# Patient Record
Sex: Female | Born: 1940
Health system: Southern US, Community
[De-identification: ages and names within clinical notes are randomized; demographics above are authoritative.]

## PROBLEM LIST (undated history)

## (undated) DIAGNOSIS — I2510 Atherosclerotic heart disease of native coronary artery without angina pectoris: Secondary | ICD-10-CM

## (undated) DIAGNOSIS — R251 Tremor, unspecified: Secondary | ICD-10-CM

## (undated) DIAGNOSIS — E785 Hyperlipidemia, unspecified: Secondary | ICD-10-CM

## (undated) DIAGNOSIS — C801 Malignant (primary) neoplasm, unspecified: Secondary | ICD-10-CM

## (undated) DIAGNOSIS — I10 Essential (primary) hypertension: Secondary | ICD-10-CM

## (undated) DIAGNOSIS — K449 Diaphragmatic hernia without obstruction or gangrene: Secondary | ICD-10-CM

## (undated) DIAGNOSIS — K219 Gastro-esophageal reflux disease without esophagitis: Secondary | ICD-10-CM

## (undated) DIAGNOSIS — D649 Anemia, unspecified: Secondary | ICD-10-CM

## (undated) DIAGNOSIS — I739 Peripheral vascular disease, unspecified: Secondary | ICD-10-CM

## (undated) DIAGNOSIS — E1140 Type 2 diabetes mellitus with diabetic neuropathy, unspecified: Secondary | ICD-10-CM

## (undated) DIAGNOSIS — M1990 Unspecified osteoarthritis, unspecified site: Secondary | ICD-10-CM

## (undated) DIAGNOSIS — I509 Heart failure, unspecified: Secondary | ICD-10-CM

## (undated) HISTORY — DX: Diaphragmatic hernia without obstruction or gangrene: K44.9

## (undated) HISTORY — DX: Anemia, unspecified: D64.9

## (undated) HISTORY — DX: Atherosclerotic heart disease of native coronary artery without angina pectoris: I25.10

## (undated) HISTORY — DX: Hyperlipidemia, unspecified: E78.5

## (undated) HISTORY — DX: Tremor, unspecified: R25.1

## (undated) HISTORY — PX: CHOLECYSTECTOMY: SHX55

## (undated) HISTORY — DX: Type 2 diabetes mellitus with diabetic neuropathy, unspecified: E11.40

## (undated) HISTORY — DX: Essential (primary) hypertension: I10

## (undated) HISTORY — PX: CARDIAC CATHETERIZATION: SHX172

## (undated) HISTORY — DX: Heart failure, unspecified: I50.9

## (undated) HISTORY — DX: Unspecified osteoarthritis, unspecified site: M19.90

## (undated) HISTORY — DX: Malignant (primary) neoplasm, unspecified: C80.1

## (undated) HISTORY — PX: EYE SURGERY: SHX253

## (undated) HISTORY — DX: Peripheral vascular disease, unspecified: I73.9

## (undated) HISTORY — PX: CORONARY ANGIOPLASTY: SHX604

---

## 1972-02-20 HISTORY — PX: KNEE SURGERY: SHX244

## 1997-06-23 ENCOUNTER — Encounter: Admission: RE | Admit: 1997-06-23 | Discharge: 1997-06-23 | Payer: Self-pay | Admitting: Hematology and Oncology

## 1997-06-30 ENCOUNTER — Ambulatory Visit (HOSPITAL_COMMUNITY): Admission: RE | Admit: 1997-06-30 | Discharge: 1997-06-30 | Payer: Self-pay

## 1997-07-14 ENCOUNTER — Encounter: Admission: RE | Admit: 1997-07-14 | Discharge: 1997-07-14 | Payer: Self-pay | Admitting: Hematology and Oncology

## 1997-08-18 ENCOUNTER — Ambulatory Visit: Admission: RE | Admit: 1997-08-18 | Discharge: 1997-08-18 | Payer: Self-pay

## 1997-09-14 ENCOUNTER — Encounter: Admission: RE | Admit: 1997-09-14 | Discharge: 1997-09-14 | Payer: Self-pay | Admitting: Hematology and Oncology

## 1998-05-05 ENCOUNTER — Encounter: Admission: RE | Admit: 1998-05-05 | Discharge: 1998-05-05 | Payer: Self-pay | Admitting: Internal Medicine

## 1998-10-17 ENCOUNTER — Encounter: Admission: RE | Admit: 1998-10-17 | Discharge: 1998-10-17 | Payer: Self-pay | Admitting: Internal Medicine

## 1999-04-06 ENCOUNTER — Encounter: Admission: RE | Admit: 1999-04-06 | Discharge: 1999-04-06 | Payer: Self-pay | Admitting: Internal Medicine

## 1999-04-11 ENCOUNTER — Encounter: Admission: RE | Admit: 1999-04-11 | Discharge: 1999-04-11 | Payer: Self-pay | Admitting: Internal Medicine

## 1999-04-25 ENCOUNTER — Encounter: Admission: RE | Admit: 1999-04-25 | Discharge: 1999-04-25 | Payer: Self-pay | Admitting: Hematology and Oncology

## 1999-05-02 ENCOUNTER — Encounter: Admission: RE | Admit: 1999-05-02 | Discharge: 1999-05-02 | Payer: Self-pay | Admitting: Internal Medicine

## 1999-06-22 ENCOUNTER — Encounter: Admission: RE | Admit: 1999-06-22 | Discharge: 1999-06-22 | Payer: Self-pay | Admitting: Hematology and Oncology

## 1999-07-06 ENCOUNTER — Encounter: Admission: RE | Admit: 1999-07-06 | Discharge: 1999-07-06 | Payer: Self-pay | Admitting: Hematology and Oncology

## 1999-09-22 ENCOUNTER — Encounter: Admission: RE | Admit: 1999-09-22 | Discharge: 1999-09-22 | Payer: Self-pay | Admitting: Internal Medicine

## 1999-10-16 ENCOUNTER — Encounter: Admission: RE | Admit: 1999-10-16 | Discharge: 1999-10-16 | Payer: Self-pay | Admitting: Internal Medicine

## 1999-11-23 ENCOUNTER — Encounter: Payer: Self-pay | Admitting: *Deleted

## 1999-11-23 ENCOUNTER — Encounter: Admission: RE | Admit: 1999-11-23 | Discharge: 1999-11-23 | Payer: Self-pay | Admitting: *Deleted

## 2000-01-26 ENCOUNTER — Encounter: Admission: RE | Admit: 2000-01-26 | Discharge: 2000-01-26 | Payer: Self-pay | Admitting: Internal Medicine

## 2000-06-04 ENCOUNTER — Encounter: Admission: RE | Admit: 2000-06-04 | Discharge: 2000-06-04 | Payer: Self-pay | Admitting: Hematology and Oncology

## 2000-12-12 ENCOUNTER — Encounter: Admission: RE | Admit: 2000-12-12 | Discharge: 2000-12-12 | Payer: Self-pay

## 2001-09-17 ENCOUNTER — Encounter: Admission: RE | Admit: 2001-09-17 | Discharge: 2001-09-17 | Payer: Self-pay | Admitting: Internal Medicine

## 2001-11-21 ENCOUNTER — Encounter: Admission: RE | Admit: 2001-11-21 | Discharge: 2001-11-21 | Payer: Self-pay | Admitting: Internal Medicine

## 2002-03-24 ENCOUNTER — Encounter: Admission: RE | Admit: 2002-03-24 | Discharge: 2002-03-24 | Payer: Self-pay | Admitting: Internal Medicine

## 2002-04-01 ENCOUNTER — Encounter: Payer: Self-pay | Admitting: Internal Medicine

## 2002-04-01 ENCOUNTER — Encounter: Admission: RE | Admit: 2002-04-01 | Discharge: 2002-04-01 | Payer: Self-pay | Admitting: Internal Medicine

## 2002-04-13 ENCOUNTER — Encounter: Admission: RE | Admit: 2002-04-13 | Discharge: 2002-04-13 | Payer: Self-pay | Admitting: Internal Medicine

## 2002-05-16 ENCOUNTER — Encounter: Payer: Self-pay | Admitting: Cardiovascular Disease

## 2002-05-16 ENCOUNTER — Inpatient Hospital Stay (HOSPITAL_COMMUNITY): Admission: EM | Admit: 2002-05-16 | Discharge: 2002-05-26 | Payer: Self-pay | Admitting: Emergency Medicine

## 2002-05-17 ENCOUNTER — Encounter: Payer: Self-pay | Admitting: Cardiovascular Disease

## 2002-05-18 ENCOUNTER — Encounter: Payer: Self-pay | Admitting: Cardiology

## 2002-05-19 ENCOUNTER — Encounter: Payer: Self-pay | Admitting: Cardiovascular Disease

## 2002-05-20 ENCOUNTER — Encounter: Payer: Self-pay | Admitting: Cardiovascular Disease

## 2002-05-22 ENCOUNTER — Encounter (INDEPENDENT_AMBULATORY_CARE_PROVIDER_SITE_OTHER): Payer: Self-pay | Admitting: Cardiology

## 2002-05-22 ENCOUNTER — Encounter: Payer: Self-pay | Admitting: Cardiovascular Disease

## 2002-05-24 ENCOUNTER — Encounter: Payer: Self-pay | Admitting: Cardiovascular Disease

## 2002-06-09 ENCOUNTER — Encounter: Admission: RE | Admit: 2002-06-09 | Discharge: 2002-06-09 | Payer: Self-pay | Admitting: Internal Medicine

## 2002-07-27 ENCOUNTER — Encounter: Admission: RE | Admit: 2002-07-27 | Discharge: 2002-07-27 | Payer: Self-pay | Admitting: Internal Medicine

## 2002-07-29 ENCOUNTER — Inpatient Hospital Stay (HOSPITAL_COMMUNITY): Admission: EM | Admit: 2002-07-29 | Discharge: 2002-07-31 | Payer: Self-pay

## 2002-07-30 ENCOUNTER — Encounter: Payer: Self-pay | Admitting: Cardiovascular Disease

## 2005-06-05 ENCOUNTER — Ambulatory Visit (HOSPITAL_COMMUNITY): Admission: RE | Admit: 2005-06-05 | Discharge: 2005-06-05 | Payer: Self-pay | Admitting: *Deleted

## 2005-07-03 ENCOUNTER — Ambulatory Visit (HOSPITAL_COMMUNITY): Admission: RE | Admit: 2005-07-03 | Discharge: 2005-07-03 | Payer: Self-pay | Admitting: *Deleted

## 2005-07-03 ENCOUNTER — Encounter (INDEPENDENT_AMBULATORY_CARE_PROVIDER_SITE_OTHER): Payer: Self-pay | Admitting: Specialist

## 2005-11-19 ENCOUNTER — Encounter: Admission: RE | Admit: 2005-11-19 | Discharge: 2005-11-19 | Payer: Self-pay | Admitting: Internal Medicine

## 2007-05-05 ENCOUNTER — Encounter: Admission: RE | Admit: 2007-05-05 | Discharge: 2007-05-05 | Payer: Self-pay | Admitting: Internal Medicine

## 2008-02-20 HISTORY — PX: OTHER SURGICAL HISTORY: SHX169

## 2009-06-07 ENCOUNTER — Encounter: Admission: RE | Admit: 2009-06-07 | Discharge: 2009-06-07 | Payer: Self-pay | Admitting: Internal Medicine

## 2009-06-07 ENCOUNTER — Encounter: Payer: Self-pay | Admitting: Cardiovascular Disease

## 2009-10-25 ENCOUNTER — Encounter: Payer: Self-pay | Admitting: Cardiovascular Disease

## 2009-11-08 ENCOUNTER — Ambulatory Visit: Payer: Self-pay | Admitting: Cardiovascular Disease

## 2009-11-08 DIAGNOSIS — E785 Hyperlipidemia, unspecified: Secondary | ICD-10-CM

## 2009-11-08 DIAGNOSIS — E119 Type 2 diabetes mellitus without complications: Secondary | ICD-10-CM

## 2009-11-08 DIAGNOSIS — I2589 Other forms of chronic ischemic heart disease: Secondary | ICD-10-CM

## 2009-11-08 DIAGNOSIS — I2510 Atherosclerotic heart disease of native coronary artery without angina pectoris: Secondary | ICD-10-CM | POA: Insufficient documentation

## 2010-03-21 NOTE — Letter (Signed)
Summary: Lower Arterial Doppler  Lower Arterial Doppler   Imported By: Marylou Mccoy 11/24/2009 13:51:18  _____________________________________________________________________  External Attachment:    Type:   Image     Comment:   External Document

## 2010-03-21 NOTE — Assessment & Plan Note (Signed)
Summary: NP6/AMD   Visit Type:  Initial Consult Primary Provider:  Gerlene Burdock Pang,M.D.  CC:  Establish care with a cardiologist prior stent and MI 2004; was seeing Dr. Charolette Child.  Denies chest pain or shortness of breath..  History of Present Illness: Teresa Peck is a 70 year old woman with a history of coronary artery disease, occlusion of her LAD in 2004, 80% lesion in her diagonal with stent placed to her diagonal, who presents to establish care.  The LAD was opened in March of 2004 to be canalized the first large diagonal go beyond the diagonal vessel, the LAD was occluded. A DES cypher stent 3.0 x 19 mm. there was no significant circumflex or RCA disease. She had postoperative atrial fibrillation requiring amiodarone therapy. Initial presentation was bilateral arm pain with her MI.  She had chest pain and transient hypotension the day after her procedure. A balloon pump was placed and was required for several days for hypotension and cardiogenic shock.  Repeat catheterization in June of 2004 by Dr. Alanda Amass for chest pain showed 30-40% left circumflex disease in the proximal region, 30% proximal RCA disease and mid disease, patent stent in the diagonal, bridging collaterals to the distal LAD, hypokinesis of the entire anterolateral wall with akinesis towards the apex, ejection fraction 43%.  Currently she feels well. She has had episodes of lower extremity edema. She takes Lasix and this seems to improve her swelling. She has no chest pain. She does have some mild shortness of breath. She does not do any exercise on a regular basis. Overall she has no complaints apart from her edema which is now resolved.  Lower extremity ultrasound showed from April 2011 ABI on the right 0.96, ABI on the left 1.0 Waveforms does suggest probable component of disease at the level of the superficial femoral arteries, popliteal arteries and bilateral arteries CT scan was recommended if pain  persisted  Recent cholesterol from September 2011 shows total cholesterol 192, triglycerides 442, HDL 38, LDL 86 Hemoglobin Y4I 7.2  EKG shows sinus bradycardia with rate 55 beats per minute, T-wave abnormality in V3 through V6, inferior leads II, III, AVF  Preventive Screening-Counseling & Management  Alcohol-Tobacco     Smoking Status: never  Caffeine-Diet-Exercise     Does Patient Exercise: no  Current Medications (verified): 1)  Plavix 75 Mg Tabs (Clopidogrel Bisulfate) .... One Tablet Once Daily 2)  Quinapril Hcl 5 Mg Tabs (Quinapril Hcl) .... One Tablet Once Daily 3)  Carvedilol 6.25 Mg Tabs (Carvedilol) .... One Tablet Once Daily 4)  Digoxin 0.125 Mg Tabs (Digoxin) .... One Tablet Once Daily 5)  Furosemide 40 Mg Tabs (Furosemide) .... One Tablet Once Daily 6)  Glyburide-Metformin 5-500 Mg Tabs (Glyburide-Metformin) .... Two Tablets Two Times A Day 7)  Januvia 100 Mg Tabs (Sitagliptin Phosphate) .... One Tablet Once Daily 8)  Klor-Con 10 10 Meq Cr-Tabs (Potassium Chloride) .... One Tablet Once Daily 9)  Pravachol 40 Mg Tabs (Pravastatin Sodium) .... One Tablet Once Daily 10)  Ranitidine Hcl 150 Mg Tabs (Ranitidine Hcl) .... One Tablet Once Daily 11)  Gabapentin 600 Mg Tabs (Gabapentin) .... One Tablet Two Times A Day 12)  Allopurinol 300 Mg Tabs (Allopurinol) .... One Tablet Once Daily 13)  Indomethacin 25 Mg Caps (Indomethacin) .... One Tablet Once Daily 14)  Aspirin 81 Mg Tbec (Aspirin) .... One Tablet Once Daily  Allergies (verified): 1)  ! * Sulfur  Past History:  Family History: Last updated: 28-Nov-2009 Mother: Deceased age 49 Father: Deceased age  51 with MI  Siblings: Brother deceased MI age 16                sister deceased MI age 26  Social History: Last updated: 11/08/2009 Retired  Divorced  Tobacco Use - No.  Alcohol Use - no Regular Exercise - no  Risk Factors: Exercise: no (11/08/2009)  Risk Factors: Smoking Status: never (11/08/2009)  Past  Medical History: CAD; MI 2004--stent hiatal hernia Hypertension anemia Hyperlipidemia diabetes  gout  Past Surgical History: CAD;stent right knee surgery 1974 gallbladder surgery bilateral cornea implants 2010  Family History: Mother: Deceased age 24 Father: Deceased age 74 with MI  Siblings: Brother deceased MI age 86                sister deceased MI age 54  Social History: Retired  Divorced  Tobacco Use - No.  Alcohol Use - no Regular Exercise - no Smoking Status:  never Does Patient Exercise:  no  Review of Systems       The patient complains of dyspnea on exertion and peripheral edema.  The patient denies fever, weight loss, weight gain, vision loss, decreased hearing, hoarseness, chest pain, syncope, prolonged cough, abdominal pain, incontinence, muscle weakness, depression, and enlarged lymph nodes.    Vital Signs:  Patient profile:   70 year old female Height:      64 inches Weight:      191 pounds BMI:     32.90 Pulse rate:   64 / minute BP sitting:   130 / 76  (left arm) Cuff size:   regular  Vitals Entered By: Bishop Dublin, CMA (November 08, 2009 3:56 PM)  Physical Exam  General:  Well developed, well nourished, in no acute distress.Obese Head:  normocephalic and atraumatic Neck:  Neck supple, no JVD. No masses, thyromegaly or abnormal cervical nodes. Lungs:  Clear bilaterally to auscultation and percussion. Heart:  Non-displaced PMI, chest non-tender; regular rate and rhythm, S1, S2 with I-II/VI SEM RSB, no rubs or gallops. Carotid upstroke normal, no bruit.  Pedals normal pulses. No edema, no varicosities. Abdomen:   abdomen soft and non-tender without masses Msk:  Back normal, normal gait. Muscle strength and tone normal. Pulses:  pulses normal in all 4 extremities Extremities:  No clubbing or cyanosis. Neurologic:  Alert and oriented x 3. Skin:  Intact without lesions or rashes. Psych:  Normal affect.   Impression &  Recommendations:  Problem # 1:  CAD (ICD-414.00) occluded LAD after a patent diagonal. Stent placed in the diagonal vessel in 2004. We will try to obtain any recent stress test or echo. she reports having an echocardiogram 2 years ago. she is asymptomatic and we have ordered no testing at this time.  Her updated medication list for this problem includes:    Plavix 75 Mg Tabs (Clopidogrel bisulfate) ..... One tablet once daily    Quinapril Hcl 5 Mg Tabs (Quinapril hcl) ..... One tablet once daily    Carvedilol 6.25 Mg Tabs (Carvedilol) ..... One tablet once daily    Aspirin 81 Mg Tbec (Aspirin) ..... One tablet once daily  Orders: EKG w/ Interpretation (93000)  Problem # 2:  CARDIOMYOPATHY, ISCHEMIC (ICD-414.8) Notes indicate a history of ischemic cardiomyopathy her ejection fraction was improving and on repeat catheterization in 2004. We'll try to obtain her most recent echocardiogram. I suspect that her lower extremity edema was likely from some mild heart failure. This has resolved on her Lasix. I suspect that she will need to continue  on her diuretic.  Her updated medication list for this problem includes:    Plavix 75 Mg Tabs (Clopidogrel bisulfate) ..... One tablet once daily    Quinapril Hcl 5 Mg Tabs (Quinapril hcl) ..... One tablet once daily    Carvedilol 6.25 Mg Tabs (Carvedilol) ..... One tablet once daily    Digoxin 0.125 Mg Tabs (Digoxin) ..... One tablet once daily    Furosemide 40 Mg Tabs (Furosemide) ..... One tablet once daily    Aspirin 81 Mg Tbec (Aspirin) ..... One tablet once daily  Problem # 3:  HYPERLIPIDEMIA-MIXED (ICD-272.4) Ideally, her LDL should be less than 70, total cholesterol less than 150. She does have a history of myalgias on Crestor and Lipitor. We have suggested that she try to increase her Pravachol to 40 mg up from 20 mg daily  Her updated medication list for this problem includes:    Pravachol 40 Mg Tabs (Pravastatin sodium) ..... One tablet  once daily  Problem # 4:  DIAB W/O COMP TYPE II/UNS NOT STATED UNCNTRL (ICD-250.00) I encouraged her to try to walk, watch her diet an effort to decrease her weight. This will help her diabetes.  Her updated medication list for this problem includes:    Quinapril Hcl 5 Mg Tabs (Quinapril hcl) ..... One tablet once daily    Glyburide-metformin 5-500 Mg Tabs (Glyburide-metformin) .Marland Kitchen..Marland Kitchen Two tablets two times a day    Januvia 100 Mg Tabs (Sitagliptin phosphate) ..... One tablet once daily    Aspirin 81 Mg Tbec (Aspirin) ..... One tablet once daily  Patient Instructions: 1)  Your physician has recommended you make the following change in your medication: INCREASE prevachol 40mg  daily  2)  Your physician wants you to follow-up in:  6 months  You will receive a reminder letter in the mail two months in advance. If you don't receive a letter, please call our office to schedule the follow-up appointment.

## 2010-03-21 NOTE — Letter (Signed)
Summary: Patient Receipt of NPP  Patient Receipt of NPP   Imported By: Marylou Mccoy 11/23/2009 11:24:04  _____________________________________________________________________  External Attachment:    Type:   Image     Comment:   External Document

## 2010-05-08 ENCOUNTER — Encounter: Payer: Self-pay | Admitting: Cardiovascular Disease

## 2010-06-07 ENCOUNTER — Encounter: Payer: Self-pay | Admitting: Cardiovascular Disease

## 2010-06-12 ENCOUNTER — Ambulatory Visit (INDEPENDENT_AMBULATORY_CARE_PROVIDER_SITE_OTHER): Payer: Medicare Other | Admitting: Cardiovascular Disease

## 2010-06-12 ENCOUNTER — Encounter: Payer: Self-pay | Admitting: Cardiovascular Disease

## 2010-06-12 DIAGNOSIS — I2510 Atherosclerotic heart disease of native coronary artery without angina pectoris: Secondary | ICD-10-CM

## 2010-06-12 DIAGNOSIS — E119 Type 2 diabetes mellitus without complications: Secondary | ICD-10-CM

## 2010-06-12 DIAGNOSIS — E785 Hyperlipidemia, unspecified: Secondary | ICD-10-CM

## 2010-06-12 DIAGNOSIS — I2589 Other forms of chronic ischemic heart disease: Secondary | ICD-10-CM

## 2010-06-12 NOTE — Assessment & Plan Note (Signed)
She reports that she feels better with less tremor if her sugars are well-controlled. We have encouraged her to watch her diet, increase her exercise.

## 2010-06-12 NOTE — Progress Notes (Signed)
Patient ID: Teresa Peck, female    DOB: [birth year], 70 y.o.   MRN: [id number]  HPI Comments: Teresa Peck is a 70 year old woman with a history of coronary artery disease, occlusion of her LAD in 2004, 80% lesion in her diagonal with stent placed to her diagonal, who presents for routine followup.  Overall she is doing well. She has no complaints. Her Lasix has been decreased secondary to elevated creatinine, per her report. She is not doing very much exercise. She has no significant edema on the decreased Lasix. No chest pain. Breathing is okay.   Details of her previous cardiac catheterization is as follows: The LAD was opened in March of 2004 to be canalized the first large diagonal go beyond the diagonal vessel, the LAD was occluded. A DES cypher stent 3.0 x 19 mm. there was no significant circumflex or RCA disease. She had postoperative atrial fibrillation requiring amiodarone therapy. Initial presentation was bilateral arm pain with her MI. She had chest pain and transient hypotension the day after her procedure. A balloon pump was placed and was required for several days for hypotension and cardiogenic shock.   Repeat catheterization in June of 2004 by Dr. Alanda Amass for chest pain showed 30-40% left circumflex disease in the proximal region, 30% proximal RCA disease and mid disease, patent stent in the diagonal, bridging collaterals to the distal LAD, hypokinesis of the entire anterolateral wall with akinesis towards the apex, ejection fraction 43%.   Lower extremity ultrasound showed from April 2011 ABI on the right 0.96, ABI on the left 1.0 Waveforms does suggest probable component of disease at the level of the superficial femoral arteries, popliteal arteries and bilateral arteries CT scan was recommended if pain persisted   Recent cholesterol from September 2011 shows total cholesterol 192, triglycerides 442, HDL 38, LDL 86 Hemoglobin B1Y 7.2   EKG shows sinus bradycardia with  rate 63 beats per minute, Old anterior infarct, poor R. Progression through the precordial leads, T-wave abnormality in leads V4 through V6, 2, 3, aVF, nonspecific ST changes in anterolateral leads      Review of Systems  Constitutional: Negative.   HENT: Negative.   Eyes: Negative.   Respiratory: Negative.   Cardiovascular: Negative.   Gastrointestinal: Negative.   Musculoskeletal: Negative.   Skin: Negative.   Neurological: Positive for tremors.  Hematological: Negative.   Psychiatric/Behavioral: Negative.   All other systems reviewed and are negative.   BP 138/78  Pulse 64  Ht 5\' 4"  (1.626 m)  Wt 184 lb 6.4 oz (83.643 kg)  BMI 31.65 kg/m2   Physical Exam  Nursing note and vitals reviewed. Constitutional: She is oriented to person, place, and time. She appears well-developed and well-nourished.  HENT:  Head: Normocephalic.  Nose: Nose normal.  Mouth/Throat: Oropharynx is clear and moist.  Eyes: Conjunctivae are normal. Pupils are equal, round, and reactive to light.  Neck: Normal range of motion. Neck supple. No JVD present.  Cardiovascular: Normal rate, regular rhythm, normal heart sounds and intact distal pulses.  Exam reveals no gallop and no friction rub.   No murmur heard. Pulmonary/Chest: Effort normal and breath sounds normal. No respiratory distress. She has no wheezes. She has no rales. She exhibits no tenderness.  Abdominal: Soft. Bowel sounds are normal. She exhibits no distension. There is no tenderness.  Musculoskeletal: Normal range of motion. She exhibits no edema and no tenderness.  Lymphadenopathy:    She has no cervical adenopathy.  Neurological: She is alert and oriented  to person, place, and time. Coordination normal.  Skin: Skin is warm and dry. No rash noted. No erythema.  Psychiatric: She has a normal mood and affect. Her behavior is normal. Judgment and thought content normal.         Assessment and Plan

## 2010-06-12 NOTE — Assessment & Plan Note (Signed)
Cholesterol was mildly elevated above goal on her last labs in September 2011. Will try to obtain her most recent labs for our records. Goal LDL less than 70.

## 2010-06-12 NOTE — Patient Instructions (Addendum)
You are doing well. No medication changes were made. Please call us if you have new issues that need to be addressed before your next appt.  We will call you for a follow up Appt. In 6 months  

## 2010-06-12 NOTE — Assessment & Plan Note (Signed)
History of significant coronary artery disease, moderately decreased ejection fraction. No signs of CHF at this time. She is asymptomatic. Still on low-dose Lasix daily.

## 2010-06-12 NOTE — Assessment & Plan Note (Signed)
Currently with no symptoms of angina. No further workup at this time. Continue current medication regimen. 

## 2010-07-07 NOTE — H&P (Signed)
NAME:  Teresa Peck, Teresa Peck                        ACCOUNT NO.:  [id number]   MEDICAL RECORD NO.:  [id number]                   PATIENT TYPE:  INP   LOCATION:  1825                                 FACILITY:  [facility]   PHYSICIAN:  Madaline Savage, M.D.             DATE OF BIRTH:  10-06-1940   DATE OF ADMISSION:  07/29/2002  DATE OF DISCHARGE:                                HISTORY & PHYSICAL   CHIEF COMPLAINT:  Chest pain.   HISTORY OF PRESENT ILLNESS:  The patient is a 70 year old female with a  history of coronary artery disease.  She had an anterolateral MI on May 16, 2002.  She was treated with a Cypher stent to her large diagonal branch.  The LAD was totaled and Dr. [name] was unable to open this. She developed  chest pain and hypotension on May 17, 2002, and was taken back to the  catheterization lab and had an intra-aortic balloon pump inserted. The  diagonal was okay at that time. Echocardiogram done during that admission  showed akinesis of the anteroseptal wall with an EF of 25 to 35%. She has  actually done well from a cardiac standpoint. She had an office visit with  Dr. [name] on Jul 16, 2002.  Her Lasix was increased to 40 mg a day because  of some edema. Her Coreg was increased to 6.25 b.i.d.  She did have PAF at  the time of her MI and has been holding sinus rhythm on amiodarone.   This morning, she developed left shoulder and left arm pain followed by  substernal chest pressure with radiation to her jaw and her throat.  She  denies any associated nausea, vomiting, or diaphoresis.  She does have some  soreness in her chest with deep breathing. Her symptoms are improved in the  emergency room after nitroglycerin.  She does say that her symptoms now are  different than her pre MI symptoms which she describes as pain in both arms  to her elbows.   PAST MEDICAL HISTORY:  Remarkable for non-insulin dependent diabetes  mellitus, Glucotrol was just started by the [facility].  She has a history of hypertension. She has PAF and is holding sinus rhythm  on amiodarone. She has dyslipidemia. She is status post gallbladder surgery  and status post right knee surgery in the past.   MEDICATIONS:  1. Neurontin 300 mg b.i.d.  2. Coreg 6.25 mg b.i.d.  3. Lipitor 20 mg a day.  4. Lasix 40 mg a day.  5. Amiodarone 200 mg a day.  6. Aspirin 81 mg a day.  7. Altace 2.5 mg a day.  8. Lanoxin 0.125 mg daily.  9. Plavix 75 mg a day.   ALLERGIES:  SULFA.   SOCIAL HISTORY:  She is a widow. She lives alone.  Her son lives nearby. She  is a nonsmoker.  She has two children and  six grandchildren and one great  grandchild.   FAMILY HISTORY:  Remarkable for coronary artery disease.  Her brother died  in his 46's of an MI.   REVIEW OF SYSTEMS:  Negative peptic ulcer disease or GI bleeding. She has  not had kidney problems. She denies any recent fever, chills, or hemoptysis.  She has noted some lower extremity edema in the recent weeks. She does have  peripheral neuropathy from the diabetes.   PHYSICAL EXAMINATION:  VITAL SIGNS: Blood pressure 110/58, pulse 67,  respirations 12.  GENERAL:  She is a well-developed, well-nourished female in no acute  distress.  HEENT:  Normocephalic.  Extraocular movements are intact.  She is  nonicteric.  She does wear glasses.  NECK:  Without bruit and without JVD.  CHEST:  Clear to auscultation and percussion.  HEART:  Regular rate and rhythm without murmurs, rubs, or gallops. Normal S1  and S2.  ABDOMEN:  Obese and nontender.  Bowel sounds are present.  She has a right  upper quadrant surgical scar.  EXTREMITIES:  Trace edema bilaterally.  Distal pulses are 2+/4.  There are  no femoral artery bruits noted.  NEUROLOGY:  Grossly intact.  She is awake, alert, oriented, and cooperative,  moves all extremities without obvious deficit.   Her EKG shows sinus rhythm with anterolateral Q's and left axis  deviation.   LABORATORY DATA:  White count 13.8, hemoglobin 12.8, hematocrit 36.8,  platelet count 326.  Sodium 133, potassium 5.4, BUN 13, creatinine 1.1, CK  116 with MB 2, troponin 0.01, INR 1.0.   Chest x-ray results are pending.   IMPRESSION:  1. Unstable angina.  2. Coronary artery disease, anterolateral myocardial infarction, May 13, 2002, treated with diagonal stenting.  3. Paroxysmal atrial fibrillation in March of 2004, holding sinus rhythm on     amiodarone.  4. Ischemic cardiomyopathy with an ejection fraction of 25 to 35% by echo in     March of 2004.  5. Non-insulin dependent diabetes mellitus with neuropathy.  6. Dyslipidemia.  7. Treated hypertension.   PLAN:  The patient will be admitted to telemetry, started on heparin and  nitroglycerin, and ruled out for an MI. We will also add a PPI. She will be  set up for diagnostic catheterization.     Abelino Derrick, P.A.                      Madaline Savage, M.D.    Lenard Lance  D:  07/29/2002  T:  07/29/2002  Job:  [id number]

## 2010-07-07 NOTE — Cardiovascular Report (Signed)
   NAME:  Teresa Peck, Teresa Peck                        ACCOUNT NO.:  [id number]   MEDICAL RECORD NO.:  [id number]                   PATIENT TYPE:  INP   LOCATION:  2906                                 FACILITY:  [facility]   PHYSICIAN:  Richard A. Alanda Amass, M.D.          DATE OF BIRTH:  Feb 03, 1941   DATE OF PROCEDURE:  07/30/2002  DATE OF DISCHARGE:                              CARDIAC CATHETERIZATION   ADDENDUM:   DISCUSSION:  The patient has had improvement in LV function on this current  angiogram and bridging collaterals of the mid LAD although she has  significant wall motion abnormality related to her recent AW MI.  She has  associated hypotension and elevated LVEDP as noted.  I would recommend  continued medical therapy of her LV dysfunction and mild residual coronary  disease.   The etiology of the patient's chest pain is not clear.  Her chest pain now  was similar to that one day after her myocardial infarction when she was  taken back to the cath lab and found to have a patent vessel.  This is  probably noncardiac and may represent upper GI reflux type disease and or  esophageal spasm.  Also, the patient has had a spiral CT because of mildly  elevated D-dimer and was found to have small bilateral effusions, borderline  mediastinal adenopathy and bilateral lower lobe collapse and consolidation  although the extent of which is not clear with full report pending.  She has  had upper respiratory infection and some mildly productive sputum and this  will be investigated further.   CATHETERIZATION DIAGNOSES:  1. Arteriosclerotic heart disease status post anterior wall myocardial     infarction associated with CGS treated with emergency recannulization of     the diagonal and successful left anterior descending recannulization and     destenting of the large diagonal May 16, 2002.  2. Subsequent restudy for substernal chest discomfort with widely patent     stent and IADP insertion  May 17, 2002.  Good recovery.  3. Exogenous obesity.  4. Adult onset diabetes mellitus.  5. Past history of hypertension.  6. Substernal chest pain.  Etiology not determined.  7. Reportedly abnormal spiral CT without evidence for pulmonary emboli.  8. Possible gastroesophageal reflux disease, esophageal spasm on aspirin and     further anti-platelet therapy.  9. Hyperlipidemia on medical therapy.                                               Richard A. Alanda Amass, M.D.    RAW/MEDQ  D:  07/30/2002  T:  07/30/2002  Job:  [id number]   cc:   [facility]

## 2010-07-07 NOTE — Cardiovascular Report (Signed)
NAME:  Teresa Peck, Teresa Peck                        ACCOUNT NO.:  [id number]   MEDICAL RECORD NO.:  [id number]                   PATIENT TYPE:  INP   LOCATION:  2906                                 FACILITY:  [facility]   PHYSICIAN:  Richard A. Alanda Amass, M.D.          DATE OF BIRTH:  1940/06/17   DATE OF PROCEDURE:  07/30/2002  DATE OF DISCHARGE:                              CARDIAC CATHETERIZATION   PROCEDURE:  Retrograde central aortic catheterization, selective coronary  angiography via Judkins technique, LV angiogram RAO, LAO projection, hand  injection.   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  She is widowed  and lives alone.  Nonsmoker.  Has two children, six grandchildren and one  great-grandchild.  She suffered an acute AW myocardial infarction on 05/16/02  and underwent emergency catheterization by Dr. Daphene Jaeger.  She was found to  have total LAD occlusion before the first diagonal.  Dr. [name] was able to  recannulize the large first diagonal, but he was not able to successfully  recannulize the LAD beyond the diagonal.  The diagonal was stented with a  3.0/18 DES Cypher stent with restoration of normal flow to the large first  diagonal branch and residual total occlusion of the LAD beyond the diagonal.  There was no significant circumflex or right coronary disease.  Reperfusion  time for the diagonal is approximately four hours from the onset of pain.   The patient was started on medical therapy for LV dysfunction.  Postoperatively, had PAF requiring IV and subsequent p.o. amiodarone  therapy.  She did have initial presentation with bilateral arm pain with her  myocardial infarction.   The day after her procedure, she had substernal chest pain with transient  hypotension and re-catheterization by Dr. Aleen Campi demonstrating widely  patent diagonal stent and continued occlusion of her LAD.  An IAVP was  inserted for hypotension and counter pulsation was done at  1:2.  She  required this for several days for relative hypotension and cardiogenic  shock.  Had no LV thrombus on 2-D echo in the hospital and was able to be  weaned from her balloon and her medications titrated.  She did well as an  outpatient with associated medical therapy of AODM, obesity, and  hyperlipidemia.  On the day prior to admission, she developed substernal  chest discomfort similar to what she had in the hospital the day after her  initial procedure.  Myocardial infarction was ruled out by serial enzymes  and EKGs.  Blood pressure was borderline in the hospital and her  nitroglycerin had to be discontinued but there was no recurrent chest pain  and we felt catheterization was indicated at this time.  Informed consent  was obtained and the patient was brought to the second floor CP lab in a  postabsorptive state after 5 mg Valium p.o. premedication.   The right groin was prepped, draped in the usual manner.  1%  Xylocaine was  used for local anesthesia and the RFA was entered with a single anterior  puncture using an 18 thin-wall needle and a 6 French short ________ side-arm  sheath were inserted without difficulty.   Diagnostic coronary and LV angiography were done with 6 French, 4 cm taper  preformed coronary and pigtail catheters.  LV angiogram was done at 25 mL,  14 mL per second RAO and 20 mL, 12 mL for second LAO projection.  Pullback  pressure of the CA showed no gradient across the aortic valve.  Hand  injection above the level of the renal arteries revealed single normal renal  arteries bilaterally.  Previous subclavian injection showed no stenosis and  patent LIMA in the past.   PRESSURES:  1. LV:  95/18, LVEDP 26/28 mmHg.  2. CA:  9560 mmHg.  3. There was no gradient across the aortic valve on catheter pullback.   Fluoroscopy revealed probable mild proximal to mid LAD calcification.  No  significant intracardiac or valvular calcification.   The left main  coronary artery was normal.   There was a moderate size diagonal vessel that bifurcated and appeared  normal.   The circumflex artery had 30-40% eccentric smooth narrowing in its proximal  third with large residual lumen.  There was a normal small first marginal.  Normal small second marginal and a large normal OM-3 and distal circumflex  vessels.  The small PABG branch was normal.  There were no circumflex  collaterals to the distal LAD.   The right coronary artery was a dominant vessel.  There was 30% smooth  narrowing in the proximal third and 30% smooth narrowing in the mid portion  before the trifurcating RV branch.  There was no other significant stenosis  with large PDA and PLA vessels.  There was no significant collateralization  to the LAD from the right.   Left anterior descending gave off small septal perforator.  This was  visualized fluoroscopically and angiographically well deployed Cypher stent  in the proximal diagonal covering the ostial and proximal diagonal. There  was 0% residual narrowing with excellent flow to the bifurcating diagonal  that was relatively large.  The LAD beyond the diagonal gave off a septal  perforator and then had diffuse disease and 99% stenosis.  There appeared to  be some bridging collaterals beyond the segmental high grade 99% LAD disease  to the mid LAD.  The LAD from the mid portion down through the apex was  visualized late and was quite thin and underfilled, but did have some  antegrade filling from the bridging collaterals in the mid LAD.   LV angiogram shows hypokinesis of the entire anterolateral wall with  akinesis towards the apex and a small area of paradoxical bulge.  There was  good contraction of the very high anterolateral wall and inferior wall.   On the LAO projection, there was good contraction of the posterior wall and there was akinesis of the upper and mid septum and hyperkinesis of the  distal septum with a small area  of paradoxical apical motion.  There was no  significant mitral regurgitation present.  Calculated EF was 43%.   DISCUSSION:  This lady's history is as outlined above.  __________.  She has  had improvement in her LV function and calculated EF is now approximately  43% although she has significant anterolateral scarring that is persistent.  She does have some bridging collaterals from the LAD occlusion to the mid  LAD  that were not apparent earlier and she has a widely patent   Dictation ended at this point.                                                Richard A. Alanda Amass, M.D.   RAW/MEDQ  D:  07/30/2002  T:  07/30/2002  Job:  [id number]   cc:   CP Lab   [facility]

## 2010-07-07 NOTE — Op Note (Signed)
NAME:  Teresa Peck, Teresa Peck            ACCOUNT NO.:  [id number]   MEDICAL RECORD NO.:  [id number]          PATIENT TYPE:  AMB   LOCATION:  ENDO                         FACILITY:  [facility]   PHYSICIAN:  Georgiana Spinner, M.D.    DATE OF BIRTH:  1940/10/08   DATE OF PROCEDURE:  06/05/2005  DATE OF DISCHARGE:                                 OPERATIVE REPORT   PROCEDURE:  Upper endoscopy.   ENDOSCOPIST:  Georgiana Spinner, M.D.   INDICATIONS:  GERD.   ANESTHESIA:  Fentanyl 50 mcg, Versed 5 mg.   DESCRIPTION OF PROCEDURE:  With the patient mildly sedated in the left  lateral decubitus position, the Olympus videoscopic endoscope was inserted  in the mouth and passed under direct vision through the esophagus, which  appeared normal, into the stomach; fundus, body, antrum, duodenal bulb and  second portion of duodenum were visualized and appeared normal.  From this  point, the endoscope was slowly withdrawn, taking circumferential views of  the duodenal mucosa until the endoscope had been pulled back into the  stomach and placed in retroflexion to view the stomach from below.  The  endoscope was straightened and withdrawn, taking circumferential views of  the remaining gastric and esophageal mucosa.  The patient's vital signs and  pulse oximetry remained stable.  The patient tolerated the procedure well  without apparent complications.   FINDINGS:  Unremarkable endoscopic examination.   PLAN:  Proceed to colonoscopy.           ______________________________  Georgiana Spinner, M.D.     GMO/MEDQ  D:  06/05/2005  T:  06/06/2005  Job:  [id number]

## 2010-07-07 NOTE — Op Note (Signed)
NAME:  Teresa Peck, PAVONE            ACCOUNT NO.:  [id number]   MEDICAL RECORD NO.:  [id number]          PATIENT TYPE:  AMB   LOCATION:  ENDO                         FACILITY:  [facility]   PHYSICIAN:  Georgiana Spinner, M.D.    DATE OF BIRTH:  02-16-1941   DATE OF PROCEDURE:  07/03/2005  DATE OF DISCHARGE:                                 OPERATIVE REPORT   PROCEDURE:  Flexible sigmoidoscopy with biopsy.   INDICATIONS:  Diarrhea.   ANESTHESIA:  None given.   PROCEDURE:  With patient in the left lateral decubitus position, the Olympus  videoscopic colonoscope was inserted in the rectum and passed under direct  vision to approximately 40 cm from the anal verge at which point the patient  becomes mildly uncomfortable, and in fact, the prep became less than optimal  at this point, so therefore elected to stop at this point and start taking  biopsies randomly as I withdrew, reviewing normal-appearing mucosa until I  pulled back all the way to the rectum.  The endoscope was withdrawn.  The  patient's vital signs and pulse oximeter remained stable.  The patient  tolerated the procedure well without apparent complications.   FINDINGS:  Unremarkable flexible sigmoidoscopy. Examination with biopsies  taken to rule out microscopic colitis.   PLAN:  Await biopsy report.  The patient will call me for results and follow-  up with me as an outpatient.           ______________________________  Georgiana Spinner, M.D.     GMO/MEDQ  D:  07/03/2005  T:  07/03/2005  Job:  [id number]

## 2010-07-07 NOTE — Op Note (Signed)
NAME:  Teresa Peck, Teresa Peck            ACCOUNT NO.:  [id number]   MEDICAL RECORD NO.:  [id number]          PATIENT TYPE:  AMB   LOCATION:  ENDO                         FACILITY:  [facility]   PHYSICIAN:  Georgiana Spinner, M.D.    DATE OF BIRTH:  01-04-1941   DATE OF PROCEDURE:  06/05/2005  DATE OF DISCHARGE:                                 OPERATIVE REPORT   PROCEDURE:  Colonoscopy.   INDICATIONS:  Colon cancer screening.   ANESTHESIA:  Fentanyl 25 mcg, Versed 2 mg.   PROCEDURE:  With the patient mildly sedated in the left lateral decubitus  position, the Olympus videoscopic colonoscope was inserted into the rectum  and passed under direct vision to the cecum identified by ileocecal valve  and appendiceal orifice, both of which were photographed. From this point,  the colonoscope was slowly withdrawn taking circumferential views of colonic  mucosa stopping only in the rectum which appeared normal on direct and on  retroflexed view, showed hemorrhoids.  The endoscope was straightened and  withdrawn.  The patient's vital signs and pulse oximeter remained stable.  The patient tolerated procedure well without apparent complications.   FINDINGS:  Diverticulosis of sigmoid colon, internal hemorrhoids, otherwise,  unremarkable exam.   PLAN:  Have the patient follow-up with me as needed.           ______________________________  Georgiana Spinner, M.D.     GMO/MEDQ  D:  06/05/2005  T:  06/05/2005  Job:  [id number]

## 2010-07-07 NOTE — Discharge Summary (Signed)
NAME:  Teresa Peck, CALIXTE NO.:  [id number]   MEDICAL RECORD NO.:  [id number]                   PATIENT TYPE:  INP   LOCATION:  2015                                 FACILITY:  [facility]   PHYSICIAN:  Nicki Guadalajara, M.D.                  DATE OF BIRTH:  [birth year]   DATE OF ADMISSION:  05/16/2002  DATE OF DISCHARGE:  05/26/2002                                 DISCHARGE SUMMARY   DISCHARGE DIAGNOSES:  1. Status post acute anterior wall myocardial infarction treated with     emergency percutaneous transluminal coronary angioplasty and stenting to     the diagonal branch of the left anterior descending.  2. Status post intra-aortic balloon pumping secondary to cardiogenic shock     by Dr. Aleen Campi on 05/17/2002.  3. Ischemic cardiomyopathy with severe left ventricular dysfunction and     ejection fraction in the range of 25-35%.  4. Paroxysmal atrial fibrillation, now maintaining sinus rhythm on oral     amiodarone therapy.  5. Type 2 diabetes mellitus, newly diagnosed.  6. Obesity.  7. Hypertension.   HISTORY OF PRESENT ILLNESS:  The patient is a 70 year old Caucasian lady who  presented with a new onset of chest pain on 05/16/2002.  She was assessed by  Dr. [name].  Her EKG showed a right bundle branch block, normal sinus rhythm,  and elevation of ST segments in precordial leads V2 and V6, and premature  ventricular complexes.   The patient had chest pain radiating to the left arm and shortness of  breath.  She was taken to cardiac catheterization the same day.  Catheterization revealed a high-grade lesion in the LAD, and multiple  attempts to open the LAD were unsuccessful.  Also, there was occlusion of  the first diagonal branch of the LAD which was successfully angioplastied  and stented with a Cypher stent.  The patient tolerated the procedure and  left the catheterization lab in a normal and stable condition.   The next day she developed severe anterior  chest pain with marked lowering  of the blood pressure with a systolic drop down to the 60s and with  tachycardia.  She was taken to the catheterization lab by Dr. Aleen Campi for  insertion of the intra-aortic balloon pump via the right femoral artery.  The patient tolerated the procedure well.  The intra-aortic balloon pump was  pumping a ratio of 2:1 inflations, and she was continued also on dobutamine  drip and heparin drip.   The next day, the patient was found to be hypertensive, with a systolic  blood pressure of 70-80/50-60, but the acute phase of cardiogenic shock was  over and she was gradually weaned off of dobutamine but still held on  dopamine and Levophed.  She also developed an onset of atrial fibrillation  which was questionable and was probably related to the Levophed  norepinephrine derivative pressor versus ischemia,  but it could be also  caused by ischemia since she has remained with high-grade stenotic lesion in  the LAD.  The patient was stared on amiodarone.  The next day, her heart  rate returned to normal sinus rhythm.  She maintained sinus rhythm  throughout admission on amiodarone p.o. 400 mg daily.   She developed hypokalemia which was successfully repleted and also developed  bilateral pleural effusions with the asymmetry to the right according to the  chest x-ray and bibasilar atelectasis.  The patient was given an IV dose of  Lasix diuretic which helped significantly.  She was instructed on how to use  incentive spirometry.   She was transferred to the regular telemetry floor on 05/24/2002.  She  remained free of chest pain and shortness of breath.  She was able to  ambulate on the hall but still complained of some weakness.  Her blood  pressure remarkably improved.  On the day prior to discharge, her blood  pressure was 142/90.   A chest x-ray showed improvement in her bilateral effusions and atelectasis.   The day of her discharge, she was assessed by Dr.  Jacinto Halim and deemed stable  for discharge home.   HOSPITAL LABS AND PERTINENT STUDIES:  Her discharge CBC showed a white blood  cell count of 9.4, hemoglobin 10.4, hematocrit 30.5, and platelet count of  230.  BMP the day before discharge showed a sodium of 136, potassium 4.2,  chloride 103, CO2 25, glucose 204, BUN 16, creatinine 0.9, magnesium level  1.9.   Her enzymes on the day of her presentation to the emergency room showed a CK  of 3650, CK-MB 343.2, and troponin I 81.722.  the enzymes on 05/18/2002, a  couple of days post-admission, showed CK 1915, CK-MB 39.6, and troponin  41.54.   Lipid profile showed a total cholesterol of 203, triglycerides 302, HDL 41,  LDL 102.   CONDITION ON DISCHARGE:  Improved, stable.   DISCHARGE MEDICATIONS:  On the day of discharge, the dose of amiodarone was  decreased to 200 mg daily, and the patient was started on Maxzide therapy.  1. Plavix 75 mg one pill daily.  2. Enteric-coated aspirin 81 mg daily.  3. Neurontin 300 mg daily.  4. Coreg 3.125 mg b.i.d.  5. Claritin 10 mg daily.  6. Amiodarone 200 mg daily.  7. Lipitor 20 mg daily.  8. Digoxin 0.125 mg daily.  9. Altace 2.5 mg daily.  10.      Maxzide 37.5/25 mg daily.  11.      Nitroglycerin 0.4 mg sublingual as needed.  May repeat dose x3     every five minutes p.r.n. pain.   DISCHARGE ACTIVITY:  As tolerated without exertion until seen by Dr. [name]  in the office.   DISCHARGE DIET:  Low carb, low fat diet.   SPECIAL INSTRUCTIONS:  The patient was advised to report any problems to our  office, and the phone number was provided.    FOLLOW UP:  She is to follow up with Dr. [name] on 06/03/2002 at 10:30 a.m.  Also, she will be followed with cardiac rehabilitation, phase 2, and she was  referred to a nutrition diabetes management center.  The center will call  the patient to set up an appointment to manage her diabetes.    Raymon Mutton, P.A.                    Nicki Guadalajara,  M.D.  MK/MEDQ  D:  05/26/2002  T:  05/27/2002  Job:  [id number]   cc:   Nicki Guadalajara, M.D.  [phone] [address], [address]  [place], [place] [address]  Fax: [phone]

## 2010-07-07 NOTE — Discharge Summary (Signed)
NAME:  Teresa, Peck NO.:  [id number]   MEDICAL RECORD NO.:  [id number]                   PATIENT TYPE:  INP   LOCATION:  2906                                 FACILITY:  [facility]   PHYSICIAN:  Nicki Guadalajara, M.D.                  DATE OF BIRTH:  April 06, 1940   DATE OF ADMISSION:  07/29/2002  DATE OF DISCHARGE:  07/31/2002                                 DISCHARGE SUMMARY   Teresa Peck is a 70 year old female patient of Dr. [name] and [facility].  She has a history of atherosclerotic cardiovascular  disease.  She received a Cypher stent to a large diagonal.  She had a total  LAD.  This was unable to be opened.  She had been doing well; however, she  came into the emergency room with complaints of left arm and shoulder pain  and substernal chest pressure with radiation to her jaw and throat, not  associated with nausea, shortness of breath, or diaphoresis.  Symptoms  improved after nitroglycerin in the emergency room; however, she also stated  that these symptoms were different from her MI symptoms.  She also has a  history of non-insulin-dependent diabetes mellitus, hypertension, and PAF.  She was seen in the emergency room by Dr. Chanda Busing.  It was decided  that she should be started on IV nitroglycerin, heparin; she should get  serial enzymes, and undergo catheterization on July 30, 2002.  Apparently a  D-dimer was also obtained.  It was slightly elevated.  Thus she underwent  spiral CT of the chest.  She then underwent cardiac catheterization on July 30, 2002, which revealed again a total LAD with left-to-left collaterals.  No significant circumflex or RCA disease.  She had a widely patent Cypher  stent to her diagonal.  Her EF was 43%.  Dr. Alanda Amass recommended treatment  empirically and to get a pulmonary consult because CT of her chest was  abnormal   Thus she was seen by Dr. Jayme Cloud.  It was felt that she had atelectasis,  obesity with obesity hypoventilation syndrome.  She was put on __________  and incentive spirometer .  She recommended that the patient go on Zithromax  500 mg p.o. every day x 3 days for possible pain versus bronchitis.  Felt  that her reactive adenopathy should be rechecked by CT as an outpatient in  six to eight weeks.  She did recommend not to do a spiral, but to do a  regular CT scan of the chest.  She also would see the patient in followup;  however, she felt the patient should be followed at family practice clinic,  and she would see back as necessary.   LABORATORY DATA:  Hemoglobin on admission 14, hematocrit 41.  On discharge  on 07/31/2002, hemoglobin 11.11 and hematocrit 32.8.  WBC 10.8, platelets  212.  Sodium 135, potassium 2.8, glucose 123,  BUN 12, creatinine 0.9.  AST  14, ALT 14.  Sodium 138, potassium 3.9, BUN 13, creatinine 1.0.  CK-MB and  troponin were all negative.  TSH was 2.021.  Total cholesterol was 87, HDL  38, LDL 30, and the LDL was 19.  Urine was negative for pathology.   CT of chest showed no evidence of acute pulmonary embolus, mediastinal  adenopathy, small left pericardial effusion, bibasilar collapse,  consolidated change bilateral lower lobes.   DISCHARGE MEDICATIONS:  1. Plavix 75 mg once per day.  2. Coreg 6.25 mg twice per day.  3. Altace 2.5 mg twice per day.  4. Lipitor 10 mg once per day.  5. Neurontin 300 mg per day.  6. Lanoxin 0.125 mg once per day.  7. Lasix 40 mg once per day.  8. Glucotrol XL 5 mg before breakfast.  9. Protonix 40 mg twice per day.  10.      Aspirin 81 mg per day.  11.      Amiodarone 200 mg once per day.  12.      Zithromax 500 mg once per day x 3 days.   ACTIVITY:  As tolerated.   FOLLOW UP:  [facility] on June 25 at 10:45.   DISCHARGE DIAGNOSES:  1. Chest pain, nonischemic related, status post cardiac catheterization with     no progressive disease or restenosis of her stent.  2. Abnormal  CT of the chest.  Pulmonary consult while in hospital with     recommendation to treat with antibiotics, use of __________ and incentive     spirometer. Follow up lymphadenopathy in six to eight weeks with a plain     CT of her chest.  3. Non-insulin-dependent diabetes mellitus.  4. Paroxysmal atrial fibrillation on amiodarone.  5. Peripheral neuropathy.  6. Gastroesophageal reflux disease.  7. Dyslipidemia.  8. Hypertension.  9. Obesity.  10.      Ischemic cardiomyopathy with ejection fraction 25 to 30% by     echocardiogram in March 2004 with improvement shown in this     catheterization with an ejection fraction now of 40 to 50%.      Lezlie Octave, N.P.                        Nicki Guadalajara, M.D.    BB/MEDQ  D:  09/30/2002  T:  10/01/2002  Job:  [id number]   cc:   [facility]   Danice Goltz, M.D. [facility]

## 2010-07-07 NOTE — Cardiovascular Report (Signed)
NAME:  Teresa Peck, Teresa Peck NO.:  [id number]   MEDICAL RECORD NO.:  [id number]                   PATIENT TYPE:  INP   LOCATION:  2015                                 FACILITY:  [facility]   PHYSICIAN:  Nicki Guadalajara, M.D.                  DATE OF BIRTH:  March 05, 1940   DATE OF PROCEDURE:  05/16/2002  DATE OF DISCHARGE:  05/26/2002                              CARDIAC CATHETERIZATION   INDICATIONS:  Ms. Teresa Peck is a 70 year old white female who  developed new onset chest pain on May 16, 2002.  She presented to the  emergency room with acute anterior lateral injury current/MI and was taken  to the catheterization laboratory for emergent intervention.   PROCEDURE:  After premedication for sedation, the patient was prepped and  draped in the usual fashion.  A 7-French sheath was inserted in the right  femoral artery.  Diagnostic catheterization was done with 6-French Judkins 4  left and right coronary catheters.  A 6-French pigtail catheter was used for  biplane cineangiography as well as distal aortography.  With a demonstration  of total proximal LAD occlusion, the patient received double bolus  Integrilin and also was given Plavix 300 mg and was already on heparin.  A 7-  [place] FL4 guide was used for the intervention.  A Luge wire was able to  cross the proximal total occlusion, which then revealed a large diagonal  system which extended almost to the apex.  Beyond this large diagonal  vessel, the LAD remained totally occluded in its mid segment and was small  caliber.  Multiple attempts with multiple wires including an extra support  wire with a Maverick and a balloon for wire stability was used to try to  cross this mid LAD occlusion.  A transient catheter was also used.  IC  nitroglycerin was administered and dilatation just proximal to the segment  was attempted.  The wire would seem to go into the small septal perforating  artery just proximal to  this total occlusion.  There was a suggestion of  possibility of development of a small false lumen.  Double wires were used  in attempt to navigate across the total occlusion into a lumen.  Despite a  long attempt, I was never able to establish flow beyond this septal  perforating artery.  Consequently, at this point, attention was directed at  the diffuse 80% long stenosis in the diagonal vessel, which was a large  caliber vessel.  A 3.0 x 18 mm drug alluding Cypher stent was then  successfully deployed in this region, dilated to 14 atmospheres.  Post  dilation was done.  At the completion of the procedure, the patient was pain-  free.   HEMODYNAMICS:  1. Central aortic pressure was 115/72, mean 96.  2. Left ventricular pressure 115/19.  3. Post A-wave 32.   ANGIOGRAPHIC DATA:  1. The left main coronary  artery was angiographically normal and trifurcated     into an LAD, an intermediate, and a moderate-sized left circumflex     system.  2. The proximal LAD was totally occluded before the first diagonal vessel.  3. The intermediate vessel was angiographically normal.  4. The circumflex vessel gave rise to two marginal vessels and ended in the     posterior lateral coronary artery.  5. The right coronary artery was a moderate-sized vessel that gave rise to a     PDA and had mild 10-20% narrowing in its mid segment.   BIPLANE CINEOARTERIOGRAPHY:  Biplane cineoarteriography revealed  angiographic mild mitral valve prolapse.  He was hypo to akinesis of the  anterior lateral wall, apex on the RAO projection, and involving the mid  distal septal extending to the inferior apical segment on the LAO  projection.   DISTAL AORTOGRAPHY:  Distal aortography did not demonstrate any renal artery  stenosis.  There was no significant iliac disease.   Following balloon dilatation and ultimate stenting, the 100% occlusion in  the proximal LAD was reduced to 0%.  Beyond this segment, there was a  very  large diagonal vessel that had 80% narrowing in its proximal segment.  Following stenting with a 3.0 x 18 mm Cypher stent, this was reduced to 0%.  The mid LAD remained totally occluded and was small caliber after a septal  perforating artery.   IMPRESSION:  1. Acute anterior lateral wall myocardial infarction secondary to total     proximal left anterior descending occlusion.  2. Normal left circumflex coronary artery.  3. Nonobstructive 10% mid right coronary artery narrowing.  4. Acute hypo to akinesis involving the anterior lateral wall, apex, and     septum with 1+ angiographic mitral valve prolapse.  5. Successful intervention in the proximal left anterior descending with the     100% occlusion being reduced to 0% and a 3.0 x 18 mm Cypher stent     inserted in the proximal diagonal vessel, reduced to 0% with resultant     TIMI-3 flow and a very large diagonal vessel extending to the left     ventricular apex with total occlusion of the mid left anterior descending     in a small left anterior descending system (done with double bolus     Integrilin/weight adjusted heparinization).                                               Nicki Guadalajara, M.D.    TK/MEDQ  D:  09/24/2002  T:  09/24/2002  Job:  [id number]   cc:   [facility]

## 2010-07-07 NOTE — Cardiovascular Report (Signed)
NAME:  Teresa Peck, Teresa Peck                        ACCOUNT NO.:  [id number]   MEDICAL RECORD NO.:  [id number]                   PATIENT TYPE:  INP   LOCATION:  2927                                 FACILITY:  [facility]   PHYSICIAN:  Aram Candela. Tysinger, M.D.              DATE OF BIRTH:  April 13, 1940   DATE OF PROCEDURE:  05/17/2002  DATE OF DISCHARGE:                              CARDIAC CATHETERIZATION   PRIMARY CARE PHYSICIAN:  Nicki Guadalajara, M.D.   PROCEDURE:  1. Left heart catheterization.  2. Coronary cineangiography.  3. Insertion of intra-aortic balloon pump, percutaneous via right femoral     artery.   INDICATION FOR PROCEDURES:  This 70 year old female was admitted on May 16, 2002, with an acute anterior myocardial infarction.  She was found to  have a total occlusion of her LAD and first large diagonal branch, and  multiple attempts at opening the LAD were unsuccessful.  She had successful  angioplasty with stent placement in the first diagonal branch.  Today, she  developed severe anterior chest pain with marked lowering of her blood  pressure to 60 systolic and a tachycardia.  Her electrocardiogram changed  markedly from her admission ECG, now showing marked ST elevation in her  interior and anterior lateral leads.  She was then returned to the  catheterization lab for possible angioplasty if her diagonal branch had  reclosed and also for insertion of an intra-aortic balloon pump if her blood  pressure remained low.  She was also started on dobutamine drip prior to  arriving at the catheterization lab.   DESCRIPTION OF PROCEDURE:  After signing an informed consent, the patient  was brought to the cardiac catheterization lab from her room in the CCU.  Her right groin was prepped and draped in a sterile fashion, and then  anesthetized locally with 1% lidocaine.  Five mg of Valium were given as a  premedication.  A 6-French introducer sheath was inserted percutaneously  into the  right femoral artery.  Then 6-French #4 Judkins coronary catheters  were used to make injections into the native coronary arteries.  After  noting an unchanged appearance from the final cineangiograms from her  emergency catheterization yesterday showing widely patent first diagonal  branch with a widely patent stented area, and occluded LAD, we elected not  to try to open up the LAD.  We also that there was no collateral flow in the  mid or distal LAD.  With her blood pressure remaining very low in the 60s  and her heart rate elevated to 110, we elected to insert the intra-aortic  balloon pump which was then inserted through the right femoral artery after  removing the 6-French sheath.  After positioning the balloon pump with the  tip below the arch, it was turned on and tried at 1:1 and 2:1 inflations,  and with fair augmentation with her blood pressure in the 80s.  With  this in  place, the balloon pump catheter was sutured in the right femoral area, and  she was then returned to her room in the coronary care unit for further  care.  She remained fully awake during the procedure, and her chest pain was  decreased at the end of the procedure.   HEMODYNAMIC DATA:  Aortic pressure 60/30.   CINEANGIOGRAM FINDINGS:  Left Coronary Artery:  The ostium and left main appeared normal.   Left Anterior Descending:  The LAD was totally occluded in the proximal  segment after the takeoff of the first diagonal branch.  The diagonal branch  was widely patent  showing very normal appearance within the proximal  segment within the stent.  There was normal TIMI-3 antegrade flow in the  first diagonal.  The mid and distal LAD was not visualized.  No collateral  flow could be seen.   Circumflex Coronary Artery:  Essentially normal.   Right Coronary Artery:  Normal.   Intra-aortic Ballon Pump:  Cineangiograms were taken showing proper  positioning of the intra-aortic balloon pump.   FINAL  DIAGNOSES:  1. Unchanged appearance from yesterday's final results.  2. Widely patent stented area in the first diagonal branch with TIMI-3 flow.  3. Totally occluded left anterior descending with no collateral seen.  4. Successful insertion of intra-aortic balloon pump via right femoral     artery.   DISPOSITION:  Will return to her bed in the CCU for further monitoring and  will continue the intra-aortic balloon pump at 2:1 inflations, and also  continue the dobutamine drip and heparin drip.                                               John R. Aleen Campi, M.D.    JRT/MEDQ  D:  05/17/2002  T:  05/18/2002  Job:  [id number]   cc:   Nicki Guadalajara, M.D.  [phone] [address]  [place], [place] [address]  Fax: [phone]   [facility]

## 2011-01-17 ENCOUNTER — Ambulatory Visit (INDEPENDENT_AMBULATORY_CARE_PROVIDER_SITE_OTHER): Payer: Medicare Other | Admitting: Cardiovascular Disease

## 2011-01-17 ENCOUNTER — Encounter: Payer: Self-pay | Admitting: Cardiovascular Disease

## 2011-01-17 VITALS — BP 132/78 | HR 57 | Ht 64.0 in | Wt 181.8 lb

## 2011-01-17 DIAGNOSIS — E785 Hyperlipidemia, unspecified: Secondary | ICD-10-CM

## 2011-01-17 DIAGNOSIS — I2589 Other forms of chronic ischemic heart disease: Secondary | ICD-10-CM

## 2011-01-17 DIAGNOSIS — E119 Type 2 diabetes mellitus without complications: Secondary | ICD-10-CM

## 2011-01-17 DIAGNOSIS — I2510 Atherosclerotic heart disease of native coronary artery without angina pectoris: Secondary | ICD-10-CM

## 2011-01-17 NOTE — Assessment & Plan Note (Signed)
Cholesterol is at goal on the current lipid regimen. No changes to the medications were made.  

## 2011-01-17 NOTE — Assessment & Plan Note (Addendum)
Old anterior infarct. No symptoms at this time. We'll continue to treat her aggressively. Doing well on ACE inhibitor, beta blocker and Lasix every 3 days.

## 2011-01-17 NOTE — Assessment & Plan Note (Signed)
Currently with no symptoms of angina. No further workup at this time. Continue current medication regimen. 

## 2011-01-17 NOTE — Assessment & Plan Note (Signed)
Slow improvement of her hemoglobin A1c. We have encouraged continued exercise, careful diet management in an effort to lose weight.

## 2011-01-17 NOTE — Patient Instructions (Addendum)
You are doing well. No medication changes were made.  Please call us if you have new issues that need to be addressed before your next appt.  The office will contact you for a follow up Appt. In 12 months  

## 2011-01-17 NOTE — Progress Notes (Signed)
Patient ID: Teresa Peck, female    DOB: [birth year], 70 y.o.   MRN: [id number]  HPI Comments: Teresa Peck is a 70 year old woman with a history of coronary artery disease, occlusion of her LAD in 2004, 80% lesion in her diagonal with stent placed to her diagonal, who presents for routine followup.  Overall she is doing well. She has no complaints.  She is not doing very much exercise Though her weight has dropped 10 pounds in the past year. She has no significant edema. No chest pain. Breathing is okay. She has an occasional twinge in her chest that comes on at rest. She reports her hemoglobin A1c has decreased from 8 down to 7.6    Details of her previous cardiac catheterization is as follows: The LAD was opened in March of 2004 to be canalized the first large diagonal go beyond the diagonal vessel, the LAD was occluded. A DES cypher stent 3.0 x 19 mm. there was no significant circumflex or RCA disease. She had postoperative atrial fibrillation requiring amiodarone therapy. Initial presentation was bilateral arm pain with her MI. She had chest pain and transient hypotension the day after her procedure. A balloon pump was placed and was required for several days for hypotension and cardiogenic shock.   Repeat catheterization in June of 2004 by Dr. Alanda Amass for chest pain showed 30-40% left circumflex disease in the proximal region, 30% proximal RCA disease and mid disease, patent stent in the diagonal, bridging collaterals to the distal LAD, hypokinesis of the entire anterolateral wall with akinesis towards the apex, ejection fraction 43%.   Lower extremity ultrasound showed from April 2011 ABI on the right 0.96, ABI on the left 1.0 Waveforms does suggest probable component of disease at the level of the superficial femoral arteries, popliteal arteries and bilateral arteries CT scan was recommended if pain persisted   EKG shows sinus bradycardia with rate 57 beats per minute, Old anterior  infarct, poor R. Progression through the precordial leads, T-wave abnormality in leads V4 through V6, 2, 3, aVF, nonspecific ST changes in anterolateral leads  Total cholesterol 156, LDL 55    Outpatient Encounter Prescriptions as of 01/17/2011  Medication Sig Dispense Refill  . allopurinol (ZYLOPRIM) 300 MG tablet Take 300 mg by mouth 2 (two) times daily.       Marland Kitchen aspirin 81 MG EC tablet Take 81 mg by mouth daily.        . carvedilol (COREG) 6.25 MG tablet Take 6.25 mg by mouth 2 (two) times daily with a meal.       . clopidogrel (PLAVIX) 75 MG tablet Take 75 mg by mouth daily.        . digoxin (LANOXIN) 0.25 MG tablet Take 250 mcg by mouth daily.        . furosemide (LASIX) 40 MG tablet Take one tablet by mouth every three days.      Marland Kitchen gabapentin (NEURONTIN) 600 MG tablet Take 600 mg by mouth 2 (two) times daily.        Marland Kitchen glyBURIDE-metformin (GLUCOVANCE) 5-500 MG per tablet Take 2 tablets by mouth 2 (two) times daily with a meal.        . indomethacin (INDOCIN) 25 MG capsule Take 25 mg by mouth 3 (three) times daily with meals. As needed.      . potassium chloride (KLOR-CON) 10 MEQ CR tablet Take 10 mEq by mouth daily.        . pravastatin (PRAVACHOL) 40 MG tablet Take  40 mg by mouth daily.        . quinapril (ACCUPRIL) 5 MG tablet Take 5 mg by mouth at bedtime.        . ranitidine (ZANTAC) 150 MG tablet Take 150 mg by mouth daily.        . sitaGLIPtan (JANUVIA) 100 MG tablet Take 100 mg by mouth daily.           Review of Systems  Constitutional: Negative.   HENT: Negative.   Eyes: Negative.   Respiratory: Negative.   Cardiovascular: Negative.   Gastrointestinal: Negative.   Musculoskeletal: Negative.   Skin: Negative.   Hematological: Negative.   Psychiatric/Behavioral: Negative.   All other systems reviewed and are negative.   BP 132/78  Pulse 57  Ht 5\' 4"  (1.626 m)  Wt 181 lb 12 oz (82.441 kg)  BMI 31.20 kg/m2   Physical Exam  Nursing note and vitals  reviewed. Constitutional: She is oriented to person, place, and time. She appears well-developed and well-nourished.  HENT:  Head: Normocephalic.  Nose: Nose normal.  Mouth/Throat: Oropharynx is clear and moist.  Eyes: Conjunctivae are normal. Pupils are equal, round, and reactive to light.  Neck: Normal range of motion. Neck supple. No JVD present.  Cardiovascular: Normal rate, regular rhythm, normal heart sounds and intact distal pulses.  Exam reveals no gallop and no friction rub.   No murmur heard. Pulmonary/Chest: Effort normal and breath sounds normal. No respiratory distress. She has no wheezes. She has no rales. She exhibits no tenderness.  Abdominal: Soft. Bowel sounds are normal. She exhibits no distension. There is no tenderness.  Musculoskeletal: Normal range of motion. She exhibits no edema and no tenderness.  Lymphadenopathy:    She has no cervical adenopathy.  Neurological: She is alert and oriented to person, place, and time. Coordination normal.  Skin: Skin is warm and dry. No rash noted. No erythema.  Psychiatric: She has a normal mood and affect. Her behavior is normal. Judgment and thought content normal.         Assessment and Plan

## 2011-02-15 ENCOUNTER — Ambulatory Visit
Admission: RE | Admit: 2011-02-15 | Discharge: 2011-02-15 | Disposition: A | Discharge status: Home / Self Care | Payer: Medicare Other | Source: Ambulatory Visit | Attending: Internal Medicine | Admitting: Internal Medicine

## 2011-02-15 ENCOUNTER — Other Ambulatory Visit: Payer: Self-pay | Admitting: Internal Medicine

## 2011-02-15 DIAGNOSIS — M79605 Pain in left leg: Secondary | ICD-10-CM

## 2011-06-14 ENCOUNTER — Other Ambulatory Visit: Payer: Self-pay | Admitting: Internal Medicine

## 2011-06-14 DIAGNOSIS — G259 Extrapyramidal and movement disorder, unspecified: Secondary | ICD-10-CM

## 2011-06-18 ENCOUNTER — Ambulatory Visit
Admission: RE | Admit: 2011-06-18 | Discharge: 2011-06-18 | Disposition: A | Discharge status: Home / Self Care | Payer: Medicare Other | Source: Ambulatory Visit | Attending: Internal Medicine | Admitting: Internal Medicine

## 2011-06-18 DIAGNOSIS — G259 Extrapyramidal and movement disorder, unspecified: Secondary | ICD-10-CM

## 2011-12-29 ENCOUNTER — Telehealth (HOSPITAL_COMMUNITY): Payer: Self-pay | Admitting: Family Medicine

## 2011-12-29 ENCOUNTER — Encounter (HOSPITAL_COMMUNITY): Payer: Self-pay | Admitting: Emergency Medicine

## 2011-12-29 ENCOUNTER — Emergency Department (INDEPENDENT_AMBULATORY_CARE_PROVIDER_SITE_OTHER)
Admission: EM | Admit: 2011-12-29 | Discharge: 2011-12-29 | Disposition: A | Discharge status: Home / Self Care | Payer: Medicare Other | Source: Home / Self Care

## 2011-12-29 DIAGNOSIS — H8110 Benign paroxysmal vertigo, unspecified ear: Secondary | ICD-10-CM

## 2011-12-29 LAB — POCT URINALYSIS DIP (DEVICE)
Bilirubin Urine: NEGATIVE
Glucose, UA: NEGATIVE mg/dL
Ketones, ur: NEGATIVE mg/dL
Leukocytes, UA: NEGATIVE
Specific Gravity, Urine: <=1.005 (ref 1.005–1.030)

## 2011-12-29 LAB — GLUCOSE, CAPILLARY: Glucose-Capillary: 139 mg/dL — ABNORMAL HIGH (ref 70–99)

## 2011-12-29 MED ORDER — MECLIZINE HCL 32 MG PO TABS: 32.0000 mg | ORAL_TABLET | Freq: Three times a day (TID) | ORAL | Status: DC | PRN

## 2011-12-29 NOTE — ED Notes (Signed)
[facility] [address] states Teresa Peck does not come in 32mg .  They have 12.5 and 25.  Dr Thad Ranger reviewed chart and changed RX to Teresa Peck 25 mg 1 three times a day qty 15.  Pharmacist Sameer made aware of change

## 2011-12-29 NOTE — ED Provider Notes (Signed)
CC:  Dizziness  HPI:  71 y.o. female with dizziness starting last night while watching TV. Felt woozy on standing up. Feels drunk, loss of balance when walking. Symptoms occur when going from sitting to standing, tilting head forward or back. Turning head side to side does not cause symptoms. No nausea/vomiting. No hearing loss or tinnitus. No congestion/cough/cold lately. Had tooth pulled a few days ago on left lower side. Taking advil.  Past Medical History  Diagnosis Date  . Coronary artery disease     MI 2004-stent  . Hiatal hernia   . Hypertension   . Anemia   . Hyperlipidemia   . Diabetes mellitus   . Gout    Past Surgical History  Procedure Date  . Cardiac catheterization     stent post MI   . Knee surgery 1974    right  . Cholecystectomy   . Bilateral cornea implants 2010   No current facility-administered medications on file prior to encounter.   Current Outpatient Prescriptions on File Prior to Encounter  Medication Sig Dispense Refill  . carvedilol (COREG) 6.25 MG tablet Take 6.25 mg by mouth 2 (two) times daily with a meal.       . clopidogrel (PLAVIX) 75 MG tablet Take 75 mg by mouth daily.        . furosemide (LASIX) 40 MG tablet Take one tablet by mouth every three days.      Marland Kitchen allopurinol (ZYLOPRIM) 300 MG tablet Take 300 mg by mouth 2 (two) times daily.       Marland Kitchen aspirin 81 MG EC tablet Take 81 mg by mouth daily.        . digoxin (LANOXIN) 0.25 MG tablet Take 250 mcg by mouth daily.        Marland Kitchen gabapentin (NEURONTIN) 600 MG tablet Take 600 mg by mouth 2 (two) times daily.        Marland Kitchen glyBURIDE-metformin (GLUCOVANCE) 5-500 MG per tablet Take 2 tablets by mouth 2 (two) times daily with a meal.        . indomethacin (INDOCIN) 25 MG capsule Take 25 mg by mouth 3 (three) times daily with meals. As needed.      . potassium chloride (KLOR-CON) 10 MEQ CR tablet Take 10 mEq by mouth daily.        . pravastatin (PRAVACHOL) 40 MG tablet Take 40 mg by mouth daily.        .  quinapril (ACCUPRIL) 5 MG tablet Take 5 mg by mouth at bedtime.        . ranitidine (ZANTAC) 150 MG tablet Take 150 mg by mouth daily.        . sitaGLIPtan (JANUVIA) 100 MG tablet Take 100 mg by mouth daily.         Allergies  Allergen Reactions  . Sulfur     Rash/flushed   No smoking, no etoh, no drugs  Fam Hx: Mom - CAD, HTN  Review of Systems - General ROS: negative for - chills, fatigue or fever Ophthalmic ROS: negative for - blurry vision, decreased vision or double vision ENT ROS: negative for - hearing change, nasal congestion, nasal discharge, tinnitus or visual changes Respiratory ROS: no cough, shortness of breath, or wheezing Cardiovascular ROS: no chest pain or dyspnea on exertion Gastrointestinal ROS: no abdominal pain, change in bowel habits, or black or bloody stools Genito-Urinary ROS: negative for - dysuria or urinary frequency/urgency Neurological ROS: negative for - confusion, numbness/tingling, speech problems or weakness  EXAM Filed  Vitals:   12/29/11 1511 12/29/11 1515 12/29/11 1518 12/29/11 1520  BP: 115/43 112/66 135/61 143/68  Pulse: 64 66 67 69  Temp: 99 F (37.2 C)     TempSrc: Oral     Resp: 18     SpO2: 97%      Physical Examination: General appearance - alert, well appearing, and in no distress Mental status - alert, oriented to person, place, and time, poor historian Eyes - pupils equal and reactive, extraocular eye movements intact Ears - bilateral TM's and external ear canals normal Nose - normal nontender sinuses Mouth - mucous membranes moist, pharynx normal without lesions Neck - supple, no significant adenopathy Chest - clear to auscultation, no wheezes, rales or rhonchi, symmetric air entry Heart - normal rate, regular rhythm, normal S1, S2, no murmurs, rubs, clicks or gallops Neurological - alert, oriented, normal speech, no focal findings or movement disorder noted    Results for orders placed during the hospital encounter of  12/29/11 (from the past 24 hour(s))  POCT URINALYSIS DIP (DEVICE)     Status: Normal   Collection Time   12/29/11  3:40 PM      Component Value Range   Glucose, UA NEGATIVE  NEGATIVE mg/dL   Bilirubin Urine NEGATIVE  NEGATIVE   Ketones, ur NEGATIVE  NEGATIVE mg/dL   Specific Gravity, Urine <=1.005  1.005 - 1.030   Hgb urine dipstick NEGATIVE  NEGATIVE   pH 6.5  5.0 - 8.0   Protein, ur NEGATIVE  NEGATIVE mg/dL   Urobilinogen, UA 0.2  0.0 - 1.0 mg/dL   Nitrite NEGATIVE  NEGATIVE   Leukocytes, UA NEGATIVE  NEGATIVE  GLUCOSE, CAPILLARY     Status: Abnormal   Collection Time   12/29/11  4:31 PM      Component Value Range   Glucose-Capillary 139 (*) 70 - 99 mg/dL   A/P 71 y.o. female with benign paroxyxmal positional vertigo - Epley maneuver attempted twice with some relief of symptoms. However, pt not very cooperative, - Trial of meclizine - F/U with PCP  Napoleon Form, MD 12/29/2011 9:10 PM    Napoleon Form, MD 12/29/11 2110

## 2011-12-29 NOTE — ED Notes (Addendum)
Pt c/o feeling dizzy since last night.... Pt was watching TV when she suddenly started to get dizzy... Since then, has been feeling dizzy and eyes feel "jumpy" ... Denies: fevers, vomiting, nauseas, diarrhea, weakness, ear pain, blurry vision, SOB, chest pains ... Has had a similar episode before in the past... Dizziness is constant, whether she sits, stands, or lays... Light/noise do not affect her

## 2014-04-21 ENCOUNTER — Ambulatory Visit (INDEPENDENT_AMBULATORY_CARE_PROVIDER_SITE_OTHER): Payer: Commercial Managed Care - HMO | Admitting: Cardiovascular Disease

## 2014-04-21 ENCOUNTER — Encounter: Payer: Self-pay | Admitting: Cardiovascular Disease

## 2014-04-21 VITALS — BP 130/70 | HR 63 | Ht 64.0 in | Wt 208.0 lb

## 2014-04-21 DIAGNOSIS — I739 Peripheral vascular disease, unspecified: Secondary | ICD-10-CM

## 2014-04-21 DIAGNOSIS — I2510 Atherosclerotic heart disease of native coronary artery without angina pectoris: Secondary | ICD-10-CM | POA: Diagnosis not present

## 2014-04-21 DIAGNOSIS — E785 Hyperlipidemia, unspecified: Secondary | ICD-10-CM | POA: Diagnosis not present

## 2014-04-21 NOTE — Progress Notes (Signed)
Cardiology Office Note   Date:  04/21/2014   ID:  Teresa Peck, DOB [birth year], MRN [id number]  PCP:  Tommy Medal, MD  Cardiologist:  = Nahser, Wonda Cheng, MD   Chief Complaint  Patient presents with  . Follow-up    CAD   Problem list: 1. Coronary artery disease: She has an occluded LAD. She status post stenting of her first diagonal 2. Hyperlipidemia 3. Chronic systolic congestive heart failure 4. Essential hypertension 5. 5. Diabetes mellitus   History of Present Illness: Teresa Peck is a 74 y.o. female who presents for follow-up of her coronary artery disease. She's been seen in the past by Dr. Rockey Situ. She was seen with her Daughter, [name].  She had a cath in 2004 and had stenting of her 1st diag. Dr. Minna Antis has stopped some of her meds ( Coreg) also stopped allopurinol and Digoxin.   She denies any cp.  Breathing is ok.   Eats lots of Progresso soup.  She does not exercise.   Knows that she needs to walk more.  Has claudication with ambulation .    Past Medical History  Diagnosis Date  . Coronary artery disease     MI 2004-stent  . Hiatal hernia   . Hypertension   . Anemia   . Hyperlipidemia   . Diabetes mellitus   . Gout     Past Surgical History  Procedure Laterality Date  . Cardiac catheterization      stent post MI   . Knee surgery  1974    right  . Cholecystectomy    . Bilateral cornea implants  2010     Current Outpatient Prescriptions  Medication Sig Dispense Refill  . aspirin 81 MG EC tablet Take 81 mg by mouth daily.      . clopidogrel (PLAVIX) 75 MG tablet Take 75 mg by mouth daily.      . furosemide (LASIX) 40 MG tablet Take one tablet by mouth every three days.    Marland Kitchen gabapentin (NEURONTIN) 600 MG tablet Take 600 mg by mouth 2 (two) times daily.      Marland Kitchen glyBURIDE (DIABETA) 5 MG tablet Take 5 mg by mouth 2 (two) times daily with a meal.    . metFORMIN (GLUCOPHAGE) 1000 MG tablet Take 1,000 mg by mouth 2 (two) times daily  with a meal.    . pioglitazone (ACTOS) 15 MG tablet Take 15 mg by mouth daily as needed.    . potassium chloride (KLOR-CON) 10 MEQ CR tablet Take 10 mEq by mouth daily.      . pravastatin (PRAVACHOL) 40 MG tablet Take 40 mg by mouth daily.      . quinapril (ACCUPRIL) 5 MG tablet Take 5 mg by mouth at bedtime.      . sitaGLIPtan (JANUVIA) 100 MG tablet Take 100 mg by mouth daily.       No current facility-administered medications for this visit.    Allergies:   Sulfur    Social History:  The patient  reports that she has never smoked. She has never used smokeless tobacco. She reports that she does not drink alcohol or use illicit drugs.   Family History:  The patient's family history includes Heart attack in her brother, father, and sister.    ROS:  Please see the history of present illness.    Review of Systems: Constitutional:  denies fever, chills, diaphoresis, appetite change and fatigue.  HEENT: denies photophobia, eye pain, redness, hearing loss, ear  pain, congestion, sore throat, rhinorrhea, sneezing, neck pain, neck stiffness and tinnitus.  Respiratory: denies SOB, DOE, cough, chest tightness, and wheezing.  Cardiovascular: denies chest pain, palpitations and leg swelling.  Gastrointestinal: denies nausea, vomiting, abdominal pain, diarrhea, constipation, blood in stool.  Genitourinary: denies dysuria, urgency, frequency, hematuria, flank pain and difficulty urinating.  Musculoskeletal: admits to  claudication in both legs with walking.   Skin: denies pallor, rash and wound.  Neurological: denies dizziness, seizures, syncope, weakness, light-headedness, numbness and headaches.   Hematological: denies adenopathy, easy bruising, personal or family bleeding history.  Psychiatric/ Behavioral: denies suicidal ideation, mood changes, confusion, nervousness, sleep disturbance and agitation.       All other systems are reviewed and negative.    PHYSICAL EXAM: VS:  BP 130/70  mmHg  Pulse 63  Ht 5\' 4"  (1.626 m)  Wt 208 lb (94.348 kg)  BMI 35.69 kg/m2 , BMI Body mass index is 35.69 kg/(m^2). GEN: Well nourished, well developed, in no acute distress HEENT: normal Neck: no JVD, carotid bruits, or masses Cardiac: RRR; no murmurs, rubs, or gallops,no edema  Respiratory:  clear to auscultation bilaterally, normal work of breathing GI: soft, nontender, nondistended, + BS MS: no deformity or atrophy Skin: warm and dry, no rash Neuro:  Strength and sensation are intact Psych: normal   EKG:  EKG is ordered today. The ekg ordered today demonstrates NSR at 64.  Old septal MI    Recent Labs: No results found for requested labs within last 365 days.    Lipid Panel No results found for: CHOL, TRIG, HDL, CHOLHDL, VLDL, LDLCALC, LDLDIRECT    Wt Readings from Last 3 Encounters:  04/21/14 208 lb (94.348 kg)  01/17/11 181 lb 12 oz (82.441 kg)  06/12/10 184 lb 6.4 oz (83.643 kg)      Other studies Reviewed: Additional studies/ records that were reviewed today include: . Review of the above records demonstrates:    ASSESSMENT AND PLAN:  1. Coronary artery disease: She has an occluded LAD. She status post stenting of her first diagonal.  She's not having any recent episodes of angina. Continue same medications.  2. Hyperlipidemia - followed by her general medical doctor.  3. Chronic systolic congestive heart failure - she has an occluded LAD and has an ischemic cardiopathy. She's not having any symptoms.  We'll consider getting an echocardiogram sometime in the future.  4. Essential hypertension - blood pressure is well-controlled.  5. 5. Diabetes mellitus   Current medicines are reviewed at length with the patient today.  The patient does not have concerns regarding medicines.  The following changes have been made:  no change   Disposition:   FU with me in 6 months.     Signed, Nahser, Wonda Cheng, MD  04/21/2014 11:42 AM    [facility]  [facility], [place], [place]  [address] Phone: [phone]; Fax: [phone]

## 2014-04-21 NOTE — Patient Instructions (Signed)
Your physician recommends that you continue on your current medications as directed. Please refer to the Current Medication list given to you today.  Your physician has requested that you have an ankle brachial index (ABI). During this test an ultrasound and blood pressure cuff are used to evaluate the arteries that supply the arms and legs with blood. Allow thirty minutes for this exam. There are no restrictions or special instructions.  Your physician wants you to follow-up in: 6 months with Dr. Acie Fredrickson.  You will receive a reminder letter in the mail two months in advance. If you don't receive a letter, please call our office to schedule the follow-up appointment. Your physician recommends that you return for lab work in: 6 months on the day of or a few days before your office visit with Dr. Acie Fredrickson.  You will need to FAST for this appointment - nothing to eat or drink after midnight the night before except water.

## 2014-04-23 DIAGNOSIS — E1165 Type 2 diabetes mellitus with hyperglycemia: Secondary | ICD-10-CM | POA: Diagnosis not present

## 2014-04-23 DIAGNOSIS — E780 Pure hypercholesterolemia: Secondary | ICD-10-CM | POA: Diagnosis not present

## 2014-04-27 ENCOUNTER — Ambulatory Visit (HOSPITAL_COMMUNITY): Payer: Commercial Managed Care - HMO | Attending: Cardiology | Admitting: *Deleted

## 2014-04-27 DIAGNOSIS — I739 Peripheral vascular disease, unspecified: Secondary | ICD-10-CM | POA: Insufficient documentation

## 2014-04-27 NOTE — Progress Notes (Signed)
Lower Extremity Arterial Doppler Exam -multi level - performed

## 2014-04-28 ENCOUNTER — Other Ambulatory Visit: Payer: Self-pay | Admitting: Internal Medicine

## 2014-04-28 DIAGNOSIS — R7989 Other specified abnormal findings of blood chemistry: Secondary | ICD-10-CM | POA: Diagnosis not present

## 2014-04-28 DIAGNOSIS — E1122 Type 2 diabetes mellitus with diabetic chronic kidney disease: Secondary | ICD-10-CM | POA: Diagnosis not present

## 2014-04-28 DIAGNOSIS — I10 Essential (primary) hypertension: Secondary | ICD-10-CM | POA: Diagnosis not present

## 2014-04-28 DIAGNOSIS — L570 Actinic keratosis: Secondary | ICD-10-CM | POA: Diagnosis not present

## 2014-04-28 DIAGNOSIS — N390 Urinary tract infection, site not specified: Secondary | ICD-10-CM | POA: Diagnosis not present

## 2014-04-28 DIAGNOSIS — E780 Pure hypercholesterolemia: Secondary | ICD-10-CM | POA: Diagnosis not present

## 2014-04-30 DIAGNOSIS — Z961 Presence of intraocular lens: Secondary | ICD-10-CM | POA: Diagnosis not present

## 2014-04-30 DIAGNOSIS — H02831 Dermatochalasis of right upper eyelid: Secondary | ICD-10-CM | POA: Diagnosis not present

## 2014-04-30 DIAGNOSIS — E119 Type 2 diabetes mellitus without complications: Secondary | ICD-10-CM | POA: Diagnosis not present

## 2014-04-30 DIAGNOSIS — H02834 Dermatochalasis of left upper eyelid: Secondary | ICD-10-CM | POA: Diagnosis not present

## 2014-05-04 ENCOUNTER — Other Ambulatory Visit: Payer: Self-pay | Admitting: Dermatology

## 2014-05-04 DIAGNOSIS — L570 Actinic keratosis: Secondary | ICD-10-CM | POA: Diagnosis not present

## 2014-05-04 DIAGNOSIS — C44612 Basal cell carcinoma of skin of right upper limb, including shoulder: Secondary | ICD-10-CM | POA: Diagnosis not present

## 2014-05-11 ENCOUNTER — Ambulatory Visit
Admission: RE | Admit: 2014-05-11 | Discharge: 2014-05-11 | Disposition: A | Discharge status: Home / Self Care | Payer: Commercial Managed Care - HMO | Source: Ambulatory Visit | Attending: Internal Medicine | Admitting: Internal Medicine

## 2014-05-11 DIAGNOSIS — R7989 Other specified abnormal findings of blood chemistry: Secondary | ICD-10-CM

## 2014-05-11 DIAGNOSIS — E119 Type 2 diabetes mellitus without complications: Secondary | ICD-10-CM | POA: Diagnosis not present

## 2014-05-11 DIAGNOSIS — I10 Essential (primary) hypertension: Secondary | ICD-10-CM | POA: Diagnosis not present

## 2014-05-11 DIAGNOSIS — R944 Abnormal results of kidney function studies: Secondary | ICD-10-CM | POA: Diagnosis not present

## 2014-05-19 DIAGNOSIS — R7989 Other specified abnormal findings of blood chemistry: Secondary | ICD-10-CM | POA: Diagnosis not present

## 2014-05-25 DIAGNOSIS — H02423 Myogenic ptosis of bilateral eyelids: Secondary | ICD-10-CM | POA: Diagnosis not present

## 2014-05-25 DIAGNOSIS — H02831 Dermatochalasis of right upper eyelid: Secondary | ICD-10-CM | POA: Diagnosis not present

## 2014-05-25 DIAGNOSIS — H02834 Dermatochalasis of left upper eyelid: Secondary | ICD-10-CM | POA: Diagnosis not present

## 2014-05-25 DIAGNOSIS — Z961 Presence of intraocular lens: Secondary | ICD-10-CM | POA: Diagnosis not present

## 2014-06-22 DIAGNOSIS — M47814 Spondylosis without myelopathy or radiculopathy, thoracic region: Secondary | ICD-10-CM | POA: Diagnosis not present

## 2014-06-22 DIAGNOSIS — M549 Dorsalgia, unspecified: Secondary | ICD-10-CM | POA: Diagnosis not present

## 2014-06-22 DIAGNOSIS — M5136 Other intervertebral disc degeneration, lumbar region: Secondary | ICD-10-CM | POA: Diagnosis not present

## 2014-07-22 DIAGNOSIS — H02831 Dermatochalasis of right upper eyelid: Secondary | ICD-10-CM | POA: Diagnosis not present

## 2014-07-22 DIAGNOSIS — H0231 Blepharochalasis right upper eyelid: Secondary | ICD-10-CM | POA: Diagnosis not present

## 2014-07-22 DIAGNOSIS — H0234 Blepharochalasis left upper eyelid: Secondary | ICD-10-CM | POA: Diagnosis not present

## 2014-07-22 DIAGNOSIS — H02834 Dermatochalasis of left upper eyelid: Secondary | ICD-10-CM | POA: Diagnosis not present

## 2014-08-10 DIAGNOSIS — E1165 Type 2 diabetes mellitus with hyperglycemia: Secondary | ICD-10-CM | POA: Diagnosis not present

## 2014-08-11 ENCOUNTER — Encounter: Payer: Self-pay | Admitting: Cardiovascular Disease

## 2014-08-16 DIAGNOSIS — R600 Localized edema: Secondary | ICD-10-CM | POA: Diagnosis not present

## 2014-08-16 DIAGNOSIS — E1142 Type 2 diabetes mellitus with diabetic polyneuropathy: Secondary | ICD-10-CM | POA: Diagnosis not present

## 2014-09-08 DIAGNOSIS — N390 Urinary tract infection, site not specified: Secondary | ICD-10-CM | POA: Diagnosis not present

## 2014-09-08 DIAGNOSIS — E1142 Type 2 diabetes mellitus with diabetic polyneuropathy: Secondary | ICD-10-CM | POA: Diagnosis not present

## 2014-09-08 DIAGNOSIS — E780 Pure hypercholesterolemia: Secondary | ICD-10-CM | POA: Diagnosis not present

## 2014-09-15 ENCOUNTER — Encounter: Payer: Self-pay | Admitting: Cardiovascular Disease

## 2014-09-15 DIAGNOSIS — E119 Type 2 diabetes mellitus without complications: Secondary | ICD-10-CM | POA: Diagnosis not present

## 2014-09-15 DIAGNOSIS — Z23 Encounter for immunization: Secondary | ICD-10-CM | POA: Diagnosis not present

## 2014-09-15 DIAGNOSIS — I10 Essential (primary) hypertension: Secondary | ICD-10-CM | POA: Diagnosis not present

## 2014-09-15 DIAGNOSIS — Z0000 Encounter for general adult medical examination without abnormal findings: Secondary | ICD-10-CM | POA: Diagnosis not present

## 2014-09-15 DIAGNOSIS — R600 Localized edema: Secondary | ICD-10-CM | POA: Diagnosis not present

## 2014-09-15 DIAGNOSIS — I2510 Atherosclerotic heart disease of native coronary artery without angina pectoris: Secondary | ICD-10-CM | POA: Diagnosis not present

## 2014-09-15 DIAGNOSIS — E669 Obesity, unspecified: Secondary | ICD-10-CM | POA: Diagnosis not present

## 2014-10-08 ENCOUNTER — Ambulatory Visit: Payer: Commercial Managed Care - HMO | Admitting: Neurology

## 2014-10-11 ENCOUNTER — Encounter: Payer: Self-pay | Admitting: Neurology

## 2014-10-11 ENCOUNTER — Ambulatory Visit (INDEPENDENT_AMBULATORY_CARE_PROVIDER_SITE_OTHER): Payer: Commercial Managed Care - HMO | Admitting: Neurology

## 2014-10-11 VITALS — BP 106/53 | HR 52 | Resp 16 | Ht 64.0 in | Wt 220.0 lb

## 2014-10-11 DIAGNOSIS — R251 Tremor, unspecified: Secondary | ICD-10-CM | POA: Diagnosis not present

## 2014-10-11 DIAGNOSIS — E0842 Diabetes mellitus due to underlying condition with diabetic polyneuropathy: Secondary | ICD-10-CM | POA: Diagnosis not present

## 2014-10-11 DIAGNOSIS — R600 Localized edema: Secondary | ICD-10-CM

## 2014-10-11 DIAGNOSIS — E669 Obesity, unspecified: Secondary | ICD-10-CM | POA: Diagnosis not present

## 2014-10-11 NOTE — Progress Notes (Signed)
Subjective:    Patient ID: ARENA LINDAHL is a 74 y.o. female.  HPI     Star Age, MD, PhD [facility] [address] [place], [place] [address]  Dear Dr. Shelia Media,   I saw your patient, Tehilla Coffel, upon your kind request in my neurologic clinic today for initial consultation of her neuropathy. The patient is unaccompanied today. As you know, Ms. Lauro is a very pleasant 74 year old right-handed woman with an underlying medical history of coronary artery disease, congestive heart failure, status post MI, status post PTCA, paroxysmal atrial fibrillation, gout, chronic edema, obesity, anxiety, hyperlipidemia, hypertension, and diabetes who has had diabetic neuropathy. She also has a history of tremors for which she saw another neurologist, Dr. Manuella Ghazi in [place]. She follows with Dr. Acie Fredrickson in cardiology. She is currently on metoprolol, Plavix, spironolactone, propranolol, Klor-Con, furosemide, cyclobenzaprine, and gabapentin which is currently at 1200 mg twice daily. She has a tremor, which affects her midline.   She reports that she has pain in her legs and lower back. She lives alone, and denies any falls. Her son lives close my. He has a head tremor.   I reviewed your office note from 08/16/2014, which you kindly included. She had labs on 08/10/2014 through your office which I reviewed: CBC with differential was unremarkable, CMP showed blood sugar level of 158, BUN at 25, creatinine at 1.4, total cholesterol elevated at 262, triglycerides elevated at 394, LDL elevated at 137. SPEP from 05/19/2014 was unremarkable. Hemoglobin A1c on 08/10/2014 was 7.1.  Her Past Medical History Is Significant For: Past Medical History  Diagnosis Date  . Coronary artery disease     MI 2004-stent  . Hiatal hernia   . Hypertension   . Anemia   . Hyperlipidemia   . Diabetes mellitus   . Gout   . Diabetic neuropathy   . CAD (coronary artery disease)   .  PAD (peripheral artery disease)   . CHF (congestive heart failure)   . Tremor   . Arthritis   . Cancer     Her Past Surgical History Is Significant For: Past Surgical History  Procedure Laterality Date  . Cardiac catheterization      stent post MI   . Knee surgery  1974    right  . Cholecystectomy    . Bilateral cornea implants  2010  . Eye surgery      Her Family History Is Significant For: Family History  Problem Relation Age of Onset  . Heart attack Father   . Heart attack Sister   . Heart attack Brother   . CAD Mother   . Stroke Mother   . Heart disease Mother   . Cancer Mother     Her Social History Is Significant For: Social History   Social History  . Marital Status: Divorced    Spouse Name: N/A  . Number of Children: 2  . Years of Education: 8th   Occupational History  . N/A    Social History Main Topics  . Smoking status: Former Research scientist (life sciences)  . Smokeless tobacco: Never Used     Comment: Quit 1982  . Alcohol Use: No  . Drug Use: No  . Sexual Activity: Not Asked   Other Topics Concern  . None   Social History Narrative   Drinks 2 cups of coffee a day     Her Allergies Are:  Allergies  Allergen Reactions  . Sulfur     Rash/flushed  :  Her Current Medications Are:  Outpatient Encounter Prescriptions as of 10/11/2014  Medication Sig  . aspirin 81 MG tablet Take 81 mg by mouth daily.  . clopidogrel (PLAVIX) 75 MG tablet Take 75 mg by mouth daily.    . furosemide (LASIX) 40 MG tablet Take one tablet by mouth every three days.  Marland Kitchen gabapentin (NEURONTIN) 600 MG tablet Take 600 mg by mouth 2 (two) times daily.    Marland Kitchen glyBURIDE (DIABETA) 5 MG tablet Take 5 mg by mouth 2 (two) times daily with a meal.  . metFORMIN (GLUCOPHAGE) 1000 MG tablet Take 1,000 mg by mouth 2 (two) times daily with a meal.  . pioglitazone (ACTOS) 15 MG tablet Take 15 mg by mouth daily as needed.  . potassium chloride (KLOR-CON) 10 MEQ CR tablet Take 10 mEq by mouth daily.    .  propranolol ER (INDERAL LA) 80 MG 24 hr capsule Take 80 mg by mouth daily.  . quinapril (ACCUPRIL) 5 MG tablet Take 5 mg by mouth at bedtime.    . [DISCONTINUED] cyclobenzaprine (FLEXERIL) 10 MG tablet Take 10 mg by mouth 3 (three) times daily as needed for muscle spasms.   No facility-administered encounter medications on file as of 10/11/2014.  :   Review of Systems:  Out of a complete 14 point review of systems, all are reviewed and negative with the exception of these symptoms as listed below:   Review of Systems  Constitutional: Positive for fatigue.       Weight gain  HENT: Positive for tinnitus.   Eyes:       Blurred vision  Cardiovascular: Positive for leg swelling.  Gastrointestinal: Positive for diarrhea.  Endocrine:       Feeling cold  Musculoskeletal:       Joint pain, cramps, aching muscles   Neurological: Positive for tremors and weakness.       Sleepiness.   Pain in both legs when walking. Tremors reports in abdomen down.   Hematological: Bruises/bleeds easily.  Psychiatric/Behavioral:       Too much sleep, decreased energy, disinterest in activities     Objective:  Neurologic Exam  Physical Exam Physical Examination:   Filed Vitals:   10/11/14 1420  BP: 106/53  Pulse: 52  Resp: 16    General Examination: The patient is a very pleasant 74 y.o. female in no acute distress. She appears well-developed and well-nourished and adequately groomed. She is obese.   HEENT: Normocephalic, atraumatic, pupils are equal, round and reactive to light and accommodation. Funduscopic exam is normal with sharp disc margins noted. Extraocular tracking is good without limitation to gaze excursion or nystagmus noted. Normal smooth pursuit is noted. Hearing is grossly intact. Tympanic membranes are clear bilaterally. Face is symmetric with normal facial animation and normal facial sensation. Speech is clear with no dysarthria noted. There is no hypophonia. There is no lip, or jaw  or voice tremor but she does have an intermittent head tremor. This is very slight. Neck is supple with full range of passive and active motion. There are no carotid bruits on auscultation. Oropharynx exam reveals: severe mouth dryness, adequate dental hygiene and mild airway crowding. Mallampati is class II. Tongue protrudes centrally and palate elevates symmetrically.   Chest: Clear to auscultation without wheezing, rhonchi or crackles noted.  Heart: S1+S2+0, regular and normal without murmurs, rubs or gallops noted.   Abdomen: Soft, non-tender and non-distended with normal bowel sounds appreciated on auscultation.  Extremities: There is 2+ pitting edema in  the distal lower extremities bilaterally. Pedal pulses are intact.  Skin: Warm and dry without trophic changes noted. There are no varicose veins, but she has chronic appearing changes in her distal legs with mild erythema noted.  Musculoskeletal: exam reveals no obvious joint deformities, tenderness or joint swelling or erythema.   Neurologically:  Mental status: The patient is awake, alert and oriented in all 4 spheres. Her immediate and remote memory, attention, language skills and fund of knowledge are appropriate. There is no evidence of aphasia, agnosia, apraxia or anomia. Speech is clear with normal prosody and enunciation. Thought process is linear. Mood is normal and affect is normal.  Cranial nerves II - XII are as described above under HEENT exam. In addition: shoulder shrug is normal with equal shoulder height noted. Motor exam: Normal bulk, strength and tone is noted. There is no drift, rest tremor or rebound. She may have a very slight postural tremor in both upper extremities. She has no action tremor. Romberg is negative. Reflexes are 1-2+ in the upper extremities, trace in both knees and absent in both ankles. Babinski: Toes are flexor bilaterally. Fine motor skills and coordination: intact with normal finger taps, normal hand  movements, normal rapid alternating patting, normal foot taps and normal foot agility.  Cerebellar testing: No dysmetria or intention tremor on finger to nose testing. Heel to shin is unremarkable bilaterally. There is no truncal or gait ataxia.  Sensory exam: intact to light touch, pinprick, vibration, temperature sense in the upper extremities with decrease to all modalities noted up to mid shin bilaterally in the distal legs.   Gait, station and balance: She stands with difficulty. No veering to one side is noted. No leaning to one side is noted. Posture is age-appropriate and stance is slightly wide-based. She has a intermittent lower body tremor when standing. This dissipates when walking. She walks slightly wide-based and cautious sleep. She turns in 3 steps. Tandem walk is not possible for her.  Assessment and Plan:    In summary, TASNEEM CORMIER is a very pleasant 74 y.o.-year old female with an underlying medical history of coronary artery disease, congestive heart failure, status post MI, status post PTCA, paroxysmal atrial fibrillation, gout, chronic edema, obesity, anxiety, hyperlipidemia, hypertension, and diabetes who has had diabetic neuropathy. She reports pain in her legs. She has been on high-dose gabapentin, currently 1200 mg twice daily. I would not suggest increasing this for fear of side effects. Of note, she lives alone. She is advised to quit drinking sodas and increase her water intake. We reviewed her most recent blood work. Her A1c was 7.1. I suggested we could consider EMG and nerve conduction testing to her lower extremities but it may not change management. She is not in favor of this test at this time. She has evidence of a midline tremor. She has some evidence of a postural tremor in her lower body with standing. This could be orthostatic tremor. There is no specific medication available for this. She already is on a relatively high dose of a beta blocker and because of her  low pulse rate and low blood pressure I would not recommend increasing this. she is advised to increase her water intake and decrease her soda intake or ideally eliminate drinking sodas. She is advised to consider using a cane for gait safety. At this juncture, I have explained to her that her neuropathy is most likely diabetic in etiology and strict diabetes control with improvement of her dietary indiscretions  and weight control are key. She is advised to continue with symptomatic treatment with gabapentin. She can follow-up with you and I suggested I see her back on an as-needed basis. I answered all her questions today and the patient was in agreement.  Thank you very much for allowing me to participate in the care of this nice patient. If I can be of any further assistance to you please do not hesitate to call me at [phone].  Sincerely,   Star Age, MD, PhD

## 2014-10-11 NOTE — Patient Instructions (Addendum)
You likely have diabetic neuropathy. You are already on high dose gabapentin and I don't feel comfortable increasing it.  Taking tylenol occasionally is okay.  You have a mild tremor, you are already on a higher dose of propranolol.  I can see you back as needed.  Please drink more water, less or ideally, no sodas. Try to lose weight.   Please remember, that any kind of tremor may be exacerbated by anxiety, anger, nervousness, excitement, dehydration, sleep deprivation, by caffeine, and low blood sugar values or blood sugar fluctuations. Some medications, especially some antidepressants and lithium can cause or exacerbate tremors. Tremors may temporarily calm down her subside with the use of a benzodiazepine such as Valium or related medications and with alcohol. Be aware however that drinking alcohol is not an approved treatment or appropriate treatment for tremor control and long-term use of benzodiazepines such as Valium, lorazepam, alprazolam, or clonazepam can cause habit formation, physical and psychological addiction.

## 2014-10-19 ENCOUNTER — Encounter: Payer: Self-pay | Admitting: Cardiovascular Disease

## 2014-10-19 ENCOUNTER — Ambulatory Visit (INDEPENDENT_AMBULATORY_CARE_PROVIDER_SITE_OTHER): Payer: Commercial Managed Care - HMO | Admitting: Cardiovascular Disease

## 2014-10-19 VITALS — BP 130/80 | HR 76 | Ht 64.0 in | Wt 225.8 lb

## 2014-10-19 DIAGNOSIS — I5022 Chronic systolic (congestive) heart failure: Secondary | ICD-10-CM | POA: Diagnosis not present

## 2014-10-19 DIAGNOSIS — I2510 Atherosclerotic heart disease of native coronary artery without angina pectoris: Secondary | ICD-10-CM

## 2014-10-19 MED ORDER — NITROGLYCERIN 0.4 MG SL SUBL: 0.4000 mg | SUBLINGUAL_TABLET | SUBLINGUAL | Status: DC | PRN

## 2014-10-19 MED ORDER — CARVEDILOL 3.125 MG PO TABS: 3.1250 mg | ORAL_TABLET | Freq: Two times a day (BID) | ORAL | Status: DC

## 2014-10-19 NOTE — Progress Notes (Signed)
Cardiology Office Note   Date:  10/19/2014   ID:  Teresa Peck, DOB Jan 03, 1941, MRN [id number]  PCP:  Horatio Pel, MD  Cardiologist:  = Dalayna Lauter, Wonda Cheng, MD   Chief Complaint  Patient presents with  . CAD   Problem list: 1. Coronary artery disease: She has an occluded LAD. She status post stenting of her first diagonal 2. Hyperlipidemia 3. Chronic systolic congestive heart failure 4. Essential hypertension 5. 5. Diabetes mellitus - peripheral neuropathy     History of Present Illness: Teresa Peck is a 74 y.o. female who presents for follow-up of her coronary artery disease. She's been seen in the past by Dr. Rockey Situ. She was seen with her Daughter, Teresa Peck.  She had a cath in 2004 and had stenting of her 1st diag. Dr. Minna Antis has stopped some of her meds ( Coreg) also stopped allopurinol and Digoxin.   She denies any cp.  Breathing is ok.   Eats lots of Progresso soup.  She does not exercise.   Knows that she needs to walk more.  Has claudication with ambulation .   Aug. 30, 2016:   Doing well.  Complains of leg pain - was told that she had diabetic neuropathy Lots of leg swelling ,  Still eating lots of soup and canned foods.     Past Medical History  Diagnosis Date  . Coronary artery disease     MI 2004-stent  . Hiatal hernia   . Hypertension   . Anemia   . Hyperlipidemia   . Diabetes mellitus   . Gout   . Diabetic neuropathy   . CAD (coronary artery disease)   . PAD (peripheral artery disease)   . CHF (congestive heart failure)   . Tremor   . Arthritis   . Cancer     Past Surgical History  Procedure Laterality Date  . Cardiac catheterization      stent post MI   . Knee surgery  1974    right  . Cholecystectomy    . Bilateral cornea implants  2010  . Eye surgery       Current Outpatient Prescriptions  Medication Sig Dispense Refill  . aspirin 81 MG tablet Take 81 mg by mouth daily.    . clopidogrel (PLAVIX) 75 MG  tablet Take 75 mg by mouth daily.      . furosemide (LASIX) 40 MG tablet Take one tablet by mouth every three days.    Marland Kitchen gabapentin (NEURONTIN) 600 MG tablet Take 600 mg by mouth 2 (two) times daily.      Marland Kitchen glyBURIDE (DIABETA) 5 MG tablet Take 5 mg by mouth 2 (two) times daily with a meal.    . metFORMIN (GLUCOPHAGE) 1000 MG tablet Take 1,000 mg by mouth 2 (two) times daily with a meal.    . pioglitazone (ACTOS) 15 MG tablet Take 15 mg by mouth daily as needed.    . potassium chloride (KLOR-CON) 10 MEQ CR tablet Take 10 mEq by mouth daily.      . propranolol ER (INDERAL LA) 80 MG 24 hr capsule Take 80 mg by mouth daily.    . quinapril (ACCUPRIL) 5 MG tablet Take 5 mg by mouth at bedtime.      Marland Kitchen spironolactone (ALDACTONE) 50 MG tablet Take 50 mg by mouth daily.     No current facility-administered medications for this visit.    Allergies:   Sulfur    Social History:  The patient  reports that she has quit smoking. She has never used smokeless tobacco. She reports that she does not drink alcohol or use illicit drugs.   Family History:  The patient's family history includes CAD in her mother; Cancer in her mother; Heart attack in her brother, father, and sister; Heart disease in her mother; Stroke in her mother.    ROS:  Please see the history of present illness.    Review of Systems: Constitutional:  denies fever, chills, diaphoresis, appetite change and fatigue.  HEENT: denies photophobia, eye pain, redness, hearing loss, ear pain, congestion, sore throat, rhinorrhea, sneezing, neck pain, neck stiffness and tinnitus.  Respiratory: denies SOB, DOE, cough, chest tightness, and wheezing.  Cardiovascular: denies chest pain, palpitations and leg swelling.  Gastrointestinal: denies nausea, vomiting, abdominal pain, diarrhea, constipation, blood in stool.  Genitourinary: denies dysuria, urgency, frequency, hematuria, flank pain and difficulty urinating.  Musculoskeletal: admits to   claudication in both legs with walking.   Skin: denies pallor, rash and wound.  Neurological: denies dizziness, seizures, syncope, weakness, light-headedness, numbness and headaches.   Hematological: denies adenopathy, easy bruising, personal or family bleeding history.  Psychiatric/ Behavioral: denies suicidal ideation, mood changes, confusion, nervousness, sleep disturbance and agitation.       All other systems are reviewed and negative.    PHYSICAL EXAM: VS:  BP 130/80 mmHg  Pulse 76  Ht 5\' 4"  (1.626 m)  Wt 102.422 kg (225 lb 12.8 oz)  BMI 38.74 kg/m2 , BMI Body mass index is 38.74 kg/(m^2). GEN: Well nourished, well developed, in no acute distress HEENT: normal Neck: no JVD, carotid bruits, or masses Cardiac: RRR; no murmurs, rubs, or gallops,no edema  Respiratory:  clear to auscultation bilaterally, normal work of breathing GI: soft, nontender, nondistended, + BS MS: no deformity or atrophy Skin: warm and dry, no rash Neuro:  Strength and sensation are intact Psych: normal   EKG:  EKG is ordered today. The ekg ordered today demonstrates NSR at 64.  Old septal MI    Recent Labs: No results found for requested labs within last 365 days.    Lipid Panel No results found for: CHOL, TRIG, HDL, CHOLHDL, VLDL, LDLCALC, LDLDIRECT    Wt Readings from Last 3 Encounters:  10/19/14 102.422 kg (225 lb 12.8 oz)  10/11/14 99.791 kg (220 lb)  04/21/14 94.348 kg (208 lb)     Other studies Reviewed: Additional studies/ records that were reviewed today include: . Review of the above records demonstrates:    ASSESSMENT AND PLAN:  1. Coronary artery disease: She has an occluded LAD. She status post stenting of her first diagonal.  She's not having any recent episodes of angina. Continue same medications.  2. Hyperlipidemia - followed by her general medical doctor.  3. Chronic systolic congestive heart failure - she has an occluded LAD and has an ischemic cardiopathy.    spironolactone 25 mg a day has been added to her medical regimen . Marland Kitchen We'll also had carvedilol 3.125 mg twice a day. She's on quinapril 5 mg a day. We will get an echocardiogram for further evaluation of her left ventricular systolic function. Will see her in 3 months.     4. Essential hypertension - blood pressure is well-controlled.  5. 5. Diabetes mellitus - she likely has a peripheral neuropathy   Current medicines are reviewed at length with the patient today.  The patient does not have concerns regarding medicines.  The following changes have been made:  no change  Disposition:  FU with me in 6 months.     Signed, Prem Coykendall, Wonda Cheng, MD  10/19/2014 4:36 PM    [facility] [address], [place], [place]  [address] Phone: [phone]; Fax: [phone]

## 2014-10-19 NOTE — Patient Instructions (Signed)
Medication Instructions:  START Carvedilol (Coreg) 3.125 mg twice daily - take 12 hours apart   Labwork: None Ordered   Testing/Procedures: Your physician has requested that you have an echocardiogram. Echocardiography is a painless test that uses sound waves to create images of your heart. It provides your doctor with information about the size and shape of your heart and how well your heart's chambers and valves are working. This procedure takes approximately one hour. There are no restrictions for this procedure.    Follow-Up: Your physician recommends that you schedule a follow-up appointment in: 3 months with Dr. Acie Fredrickson.

## 2014-10-26 ENCOUNTER — Other Ambulatory Visit (HOSPITAL_COMMUNITY): Payer: Commercial Managed Care - HMO

## 2014-11-01 DIAGNOSIS — C44329 Squamous cell carcinoma of skin of other parts of face: Secondary | ICD-10-CM | POA: Diagnosis not present

## 2014-11-01 DIAGNOSIS — Z85828 Personal history of other malignant neoplasm of skin: Secondary | ICD-10-CM | POA: Diagnosis not present

## 2014-11-02 ENCOUNTER — Ambulatory Visit (HOSPITAL_COMMUNITY): Payer: Commercial Managed Care - HMO | Attending: Cardiology

## 2014-11-02 ENCOUNTER — Other Ambulatory Visit: Payer: Self-pay

## 2014-11-02 DIAGNOSIS — R29898 Other symptoms and signs involving the musculoskeletal system: Secondary | ICD-10-CM | POA: Insufficient documentation

## 2014-11-02 DIAGNOSIS — I517 Cardiomegaly: Secondary | ICD-10-CM | POA: Diagnosis not present

## 2014-11-02 DIAGNOSIS — E119 Type 2 diabetes mellitus without complications: Secondary | ICD-10-CM | POA: Insufficient documentation

## 2014-11-02 DIAGNOSIS — I509 Heart failure, unspecified: Secondary | ICD-10-CM | POA: Diagnosis not present

## 2014-11-02 DIAGNOSIS — I059 Rheumatic mitral valve disease, unspecified: Secondary | ICD-10-CM | POA: Insufficient documentation

## 2014-11-02 DIAGNOSIS — I5189 Other ill-defined heart diseases: Secondary | ICD-10-CM | POA: Insufficient documentation

## 2014-11-02 DIAGNOSIS — I5022 Chronic systolic (congestive) heart failure: Secondary | ICD-10-CM | POA: Diagnosis not present

## 2014-11-02 DIAGNOSIS — E785 Hyperlipidemia, unspecified: Secondary | ICD-10-CM | POA: Diagnosis not present

## 2014-11-02 DIAGNOSIS — Z87891 Personal history of nicotine dependence: Secondary | ICD-10-CM | POA: Insufficient documentation

## 2014-11-03 ENCOUNTER — Telehealth: Payer: Self-pay | Admitting: Cardiovascular Disease

## 2014-11-03 NOTE — Telephone Encounter (Signed)
Reviewed echo results with patient who verbalized understanding.    

## 2014-11-03 NOTE — Telephone Encounter (Signed)
Pt rtn call to [employer] call

## 2014-12-01 DIAGNOSIS — G8929 Other chronic pain: Secondary | ICD-10-CM | POA: Diagnosis not present

## 2014-12-01 DIAGNOSIS — E1121 Type 2 diabetes mellitus with diabetic nephropathy: Secondary | ICD-10-CM | POA: Diagnosis not present

## 2014-12-01 DIAGNOSIS — C44310 Basal cell carcinoma of skin of unspecified parts of face: Secondary | ICD-10-CM | POA: Diagnosis not present

## 2014-12-01 DIAGNOSIS — M545 Low back pain: Secondary | ICD-10-CM | POA: Diagnosis not present

## 2014-12-01 DIAGNOSIS — R7309 Other abnormal glucose: Secondary | ICD-10-CM | POA: Diagnosis not present

## 2014-12-20 DIAGNOSIS — C44329 Squamous cell carcinoma of skin of other parts of face: Secondary | ICD-10-CM | POA: Diagnosis not present

## 2014-12-20 DIAGNOSIS — Z85828 Personal history of other malignant neoplasm of skin: Secondary | ICD-10-CM | POA: Diagnosis not present

## 2014-12-31 DIAGNOSIS — M545 Low back pain: Secondary | ICD-10-CM | POA: Diagnosis not present

## 2015-01-18 ENCOUNTER — Other Ambulatory Visit: Payer: Self-pay | Admitting: Orthopaedic Surgery

## 2015-01-18 DIAGNOSIS — M545 Low back pain: Secondary | ICD-10-CM

## 2015-01-21 ENCOUNTER — Encounter: Payer: Self-pay | Admitting: Cardiovascular Disease

## 2015-01-21 ENCOUNTER — Ambulatory Visit (INDEPENDENT_AMBULATORY_CARE_PROVIDER_SITE_OTHER): Payer: Commercial Managed Care - HMO | Admitting: Cardiovascular Disease

## 2015-01-21 VITALS — BP 124/80 | HR 60 | Ht 64.0 in | Wt 220.0 lb

## 2015-01-21 DIAGNOSIS — I2510 Atherosclerotic heart disease of native coronary artery without angina pectoris: Secondary | ICD-10-CM | POA: Diagnosis not present

## 2015-01-21 DIAGNOSIS — I5022 Chronic systolic (congestive) heart failure: Secondary | ICD-10-CM

## 2015-01-21 DIAGNOSIS — I509 Heart failure, unspecified: Secondary | ICD-10-CM | POA: Diagnosis not present

## 2015-01-21 MED ORDER — QUINAPRIL HCL 10 MG PO TABS: 10.0000 mg | ORAL_TABLET | Freq: Every day | ORAL | Status: DC

## 2015-01-21 NOTE — Patient Instructions (Signed)
Medication Instructions:  INCREASE Quinapril to 10 mg once daily   Labwork: TODAY - basic metabolic panel   Your physician recommends that you return for lab work in: 3 weeks for basic metabolic panel   Testing/Procedures: None Ordered   Follow-Up: Your physician recommends that you schedule a follow-up appointment in: 3 months with Dr. Acie Fredrickson   If you need a refill on your cardiac medications before your next appointment, please call your pharmacy.   Thank you for choosing [facility]! Christen Bame, RN [phone]

## 2015-01-21 NOTE — Progress Notes (Signed)
Cardiology Office Note   Date:  01/21/2015   ID:  Teresa Peck, DOB [birth year], MRN [id number]  PCP:  Teresa Pel, MD  Cardiologist:  = Teresa Peck, Teresa Cheng, MD   Chief Complaint  Patient presents with  . Follow-up   Problem list: 1. Coronary artery disease: She has an occluded LAD. She status post stenting of her first diagonal 2. Hyperlipidemia 3. Chronic systolic congestive heart failure 4. Essential hypertension 5. 5. Diabetes mellitus - peripheral neuropathy     History of Present Illness: Teresa Peck is a 74 y.o. female who presents for follow-up of her coronary artery disease. She's been seen in the past by Dr. Rockey Peck. She was seen with her Daughter, Teresa Peck.  She had a cath in 2004 and had stenting of her 1st diag. Dr. Minna Antis has stopped some of her meds ( Coreg) also stopped allopurinol and Digoxin.   She denies any cp.  Breathing is ok.   Eats lots of Progresso soup.  She does not exercise.   Knows that she needs to walk more.  Has claudication with ambulation .   Aug. 30, 2016:   Doing well.  Complains of leg pain - was told that she had diabetic neuropathy Lots of leg swelling ,  Still eating lots of soup and canned foods.   Dec. 2 , 2016:  Breathing is good.  Leg Swelling is better  She thinks the Actos is causing fluid retention and weight gain .     Past Medical History  Diagnosis Date  . Coronary artery disease     MI 2004-stent  . Hiatal hernia   . Hypertension   . Anemia   . Hyperlipidemia   . Diabetes mellitus   . Gout   . Diabetic neuropathy (Teresa Peck)   . CAD (coronary artery disease)   . PAD (peripheral artery disease) (Teresa Peck)   . CHF (congestive heart failure) (Teresa Peck)   . Tremor   . Arthritis   . Cancer Teresa Peck)     Past Surgical History  Procedure Laterality Date  . Cardiac catheterization      stent post MI   . Knee surgery  1974    right  . Cholecystectomy    . Bilateral cornea implants  2010  . Eye  surgery       Current Outpatient Prescriptions  Medication Sig Dispense Refill  . aspirin 81 MG tablet Take 81 mg by mouth daily.    . carvedilol (COREG) 3.125 MG tablet Take 1 tablet (3.125 mg total) by mouth 2 (two) times daily. 60 tablet 11  . clopidogrel (PLAVIX) 75 MG tablet Take 75 mg by mouth daily.      . furosemide (LASIX) 40 MG tablet Take 40 mg by mouth daily.    Marland Kitchen gabapentin (NEURONTIN) 600 MG tablet Take 600 mg by mouth 2 (two) times daily.      Marland Kitchen glyBURIDE (DIABETA) 5 MG tablet Take 5 mg by mouth 2 (two) times daily with a meal.    . metFORMIN (GLUCOPHAGE) 1000 MG tablet Take 1,000 mg by mouth 2 (two) times daily with a meal.    . nitroGLYCERIN (NITROSTAT) 0.4 MG SL tablet Place 1 tablet (0.4 mg total) under the tongue every 5 (five) minutes as needed for chest pain. 25 tablet 6  . pioglitazone (ACTOS) 15 MG tablet Take 15 mg by mouth daily as needed.    . propranolol ER (INDERAL LA) 80 MG 24 hr capsule Take 80 mg by  mouth daily.    . quinapril (ACCUPRIL) 5 MG tablet Take 5 mg by mouth at bedtime.      Marland Kitchen spironolactone (ALDACTONE) 50 MG tablet Take 25 mg by mouth daily.     No current facility-administered medications for this visit.    Allergies:   Sulfur    Social History:  The patient  reports that she has quit smoking. She has never used smokeless tobacco. She reports that she does not drink alcohol or use illicit drugs.   Family History:  The patient's family history includes CAD in her mother; Cancer in her mother; Heart attack in her brother, father, and sister; Heart disease in her mother; Stroke in her mother.    ROS:  Please see the history of present illness.    Review of Systems: Constitutional:  denies fever, chills, diaphoresis, appetite change and fatigue.  HEENT: denies photophobia, eye pain, redness, hearing loss, ear pain, congestion, sore throat, rhinorrhea, sneezing, neck pain, neck stiffness and tinnitus.  Respiratory: denies SOB, DOE, cough,  chest tightness, and wheezing.  Cardiovascular: denies chest pain, palpitations and leg swelling.  Gastrointestinal: denies nausea, vomiting, abdominal pain, diarrhea, constipation, blood in stool.  Genitourinary: denies dysuria, urgency, frequency, hematuria, flank pain and difficulty urinating.  Musculoskeletal: admits to  claudication in both legs with walking.   Skin: denies pallor, rash and wound.  Neurological: denies dizziness, seizures, syncope, weakness, light-headedness, numbness and headaches.   Hematological: denies adenopathy, easy bruising, personal or family bleeding history.  Psychiatric/ Behavioral: denies suicidal ideation, mood changes, confusion, nervousness, sleep disturbance and agitation.       All other systems are reviewed and negative.    PHYSICAL EXAM: VS:  BP 124/80 mmHg  Pulse 60  Ht 5\' 4"  (1.626 m)  Wt 220 lb (99.791 kg)  BMI 37.74 kg/m2 , BMI Body mass index is 37.74 kg/(m^2). GEN: Well nourished, well developed, in no acute distress HEENT: normal Neck: no JVD, carotid bruits, or masses Cardiac: RRR; no murmurs, rubs, or gallops,no edema  Respiratory:  clear to auscultation bilaterally, normal work of breathing GI: soft, nontender, nondistended, + BS MS: no deformity or atrophy Skin: warm and dry, no rash Neuro:  Strength and sensation are intact Psych: normal   EKG:  EKG is ordered today. The ekg ordered today demonstrates NSR at 64.  Old septal MI    Recent Labs: No results found for requested labs within last 365 days.    Lipid Panel No results found for: CHOL, TRIG, HDL, CHOLHDL, VLDL, LDLCALC, LDLDIRECT    Wt Readings from Last 3 Encounters:  01/21/15 220 lb (99.791 kg)  10/19/14 225 lb 12.8 oz (102.422 kg)  10/11/14 220 lb (99.791 kg)     Other studies Reviewed: Additional studies/ records that were reviewed today include: . Review of the above records demonstrates:    ASSESSMENT AND PLAN:  1. Coronary artery disease: She  has an occluded LAD. She status post stenting of her first diagonal.  She's not having any recent episodes of angina. Continue same medications.  2. Hyperlipidemia - followed by her general medical doctor.  3. Chronic systolic congestive heart failure - she has an occluded LAD and has an ischemic cardiopathy.   spironolactone 25 mg a day has been added to her medical regimen .  On  carvedilol 3.125 mg twice a day. She's on quinapril 5 mg a day.   We will increase Quinipril to 10 mg a day   check BMP today and in  3 weeks.  I think she should try something other than Actos. It can cause leg edema and weight gain .   Will see her in 3 months.     4. Essential hypertension - blood pressure is well-controlled.  5. 5. Diabetes mellitus - she likely has a peripheral neuropathy   Current medicines are reviewed at length with the patient today.  The patient does not have concerns regarding medicines.  The following changes have been made:  no change  Disposition:   FU with me in 6 months.     Chelcea Zahn, Teresa Cheng, MD  01/21/2015 4:27 PM    [facility] [address], [place], [place]  [address] Phone: [phone]; Fax: [phone]

## 2015-01-22 LAB — BASIC METABOLIC PANEL
BUN: 39 mg/dL — AB (ref 7–25)
CO2: 23 mmol/L (ref 20–31)
Calcium: 9.2 mg/dL (ref 8.6–10.4)
Chloride: 102 mmol/L (ref 98–110)
Creat: 1.76 mg/dL — ABNORMAL HIGH (ref 0.60–0.93)
GLUCOSE: 115 mg/dL — AB (ref 65–99)
POTASSIUM: 4.7 mmol/L (ref 3.5–5.3)
Sodium: 136 mmol/L (ref 135–146)

## 2015-01-27 DIAGNOSIS — I10 Essential (primary) hypertension: Secondary | ICD-10-CM | POA: Diagnosis not present

## 2015-01-27 DIAGNOSIS — E1165 Type 2 diabetes mellitus with hyperglycemia: Secondary | ICD-10-CM | POA: Diagnosis not present

## 2015-02-08 ENCOUNTER — Other Ambulatory Visit (INDEPENDENT_AMBULATORY_CARE_PROVIDER_SITE_OTHER): Payer: Commercial Managed Care - HMO | Admitting: *Deleted

## 2015-02-08 DIAGNOSIS — I509 Heart failure, unspecified: Secondary | ICD-10-CM | POA: Diagnosis not present

## 2015-02-08 DIAGNOSIS — I5022 Chronic systolic (congestive) heart failure: Secondary | ICD-10-CM

## 2015-02-08 DIAGNOSIS — I2510 Atherosclerotic heart disease of native coronary artery without angina pectoris: Secondary | ICD-10-CM | POA: Diagnosis not present

## 2015-02-08 LAB — BASIC METABOLIC PANEL
BUN: 26 mg/dL — ABNORMAL HIGH (ref 7–25)
CALCIUM: 9 mg/dL (ref 8.6–10.4)
CHLORIDE: 105 mmol/L (ref 98–110)
CO2: 17 mmol/L — AB (ref 20–31)
Creat: 1.75 mg/dL — ABNORMAL HIGH (ref 0.60–0.93)
Glucose, Bld: 138 mg/dL — ABNORMAL HIGH (ref 65–99)
Potassium: 4.6 mmol/L (ref 3.5–5.3)
SODIUM: 139 mmol/L (ref 135–146)

## 2015-02-08 NOTE — Addendum Note (Signed)
Addended by: Eulis Foster on: 02/08/2015 09:07 AM   Modules accepted: Orders

## 2015-02-15 ENCOUNTER — Ambulatory Visit
Admission: RE | Admit: 2015-02-15 | Discharge: 2015-02-15 | Disposition: A | Discharge status: Home / Self Care | Payer: Commercial Managed Care - HMO | Source: Ambulatory Visit | Attending: Orthopaedic Surgery | Admitting: Orthopaedic Surgery

## 2015-02-15 DIAGNOSIS — M4806 Spinal stenosis, lumbar region: Secondary | ICD-10-CM | POA: Diagnosis not present

## 2015-02-15 DIAGNOSIS — M545 Low back pain: Secondary | ICD-10-CM

## 2015-02-18 DIAGNOSIS — M545 Low back pain: Secondary | ICD-10-CM | POA: Diagnosis not present

## 2015-03-25 DIAGNOSIS — I952 Hypotension due to drugs: Secondary | ICD-10-CM | POA: Diagnosis not present

## 2015-03-25 DIAGNOSIS — R42 Dizziness and giddiness: Secondary | ICD-10-CM | POA: Diagnosis not present

## 2015-03-29 ENCOUNTER — Other Ambulatory Visit (HOSPITAL_COMMUNITY): Payer: Self-pay | Admitting: Orthopaedic Surgery

## 2015-04-01 DIAGNOSIS — I952 Hypotension due to drugs: Secondary | ICD-10-CM | POA: Diagnosis not present

## 2015-04-07 ENCOUNTER — Other Ambulatory Visit (HOSPITAL_COMMUNITY): Payer: Commercial Managed Care - HMO

## 2015-04-08 ENCOUNTER — Encounter (HOSPITAL_COMMUNITY): Payer: Self-pay

## 2015-04-08 ENCOUNTER — Other Ambulatory Visit (HOSPITAL_COMMUNITY): Payer: Self-pay | Admitting: *Deleted

## 2015-04-08 ENCOUNTER — Encounter (HOSPITAL_COMMUNITY)
Admission: RE | Admit: 2015-04-08 | Discharge: 2015-04-08 | Disposition: A | Discharge status: Home / Self Care | Payer: Commercial Managed Care - HMO | Source: Ambulatory Visit | Attending: Orthopaedic Surgery | Admitting: Orthopaedic Surgery

## 2015-04-08 DIAGNOSIS — Z7902 Long term (current) use of antithrombotics/antiplatelets: Secondary | ICD-10-CM | POA: Diagnosis not present

## 2015-04-08 DIAGNOSIS — E1140 Type 2 diabetes mellitus with diabetic neuropathy, unspecified: Secondary | ICD-10-CM | POA: Diagnosis not present

## 2015-04-08 DIAGNOSIS — E875 Hyperkalemia: Secondary | ICD-10-CM | POA: Insufficient documentation

## 2015-04-08 DIAGNOSIS — Z01818 Encounter for other preprocedural examination: Secondary | ICD-10-CM | POA: Insufficient documentation

## 2015-04-08 DIAGNOSIS — E1122 Type 2 diabetes mellitus with diabetic chronic kidney disease: Secondary | ICD-10-CM | POA: Diagnosis not present

## 2015-04-08 DIAGNOSIS — I959 Hypotension, unspecified: Secondary | ICD-10-CM | POA: Insufficient documentation

## 2015-04-08 DIAGNOSIS — Z7984 Long term (current) use of oral hypoglycemic drugs: Secondary | ICD-10-CM | POA: Diagnosis not present

## 2015-04-08 DIAGNOSIS — I129 Hypertensive chronic kidney disease with stage 1 through stage 4 chronic kidney disease, or unspecified chronic kidney disease: Secondary | ICD-10-CM | POA: Insufficient documentation

## 2015-04-08 DIAGNOSIS — Z87891 Personal history of nicotine dependence: Secondary | ICD-10-CM | POA: Insufficient documentation

## 2015-04-08 DIAGNOSIS — I2510 Atherosclerotic heart disease of native coronary artery without angina pectoris: Secondary | ICD-10-CM | POA: Diagnosis not present

## 2015-04-08 DIAGNOSIS — E785 Hyperlipidemia, unspecified: Secondary | ICD-10-CM | POA: Insufficient documentation

## 2015-04-08 DIAGNOSIS — K219 Gastro-esophageal reflux disease without esophagitis: Secondary | ICD-10-CM | POA: Insufficient documentation

## 2015-04-08 DIAGNOSIS — I252 Old myocardial infarction: Secondary | ICD-10-CM | POA: Insufficient documentation

## 2015-04-08 DIAGNOSIS — M4806 Spinal stenosis, lumbar region: Secondary | ICD-10-CM | POA: Diagnosis not present

## 2015-04-08 DIAGNOSIS — I739 Peripheral vascular disease, unspecified: Secondary | ICD-10-CM | POA: Insufficient documentation

## 2015-04-08 DIAGNOSIS — N183 Chronic kidney disease, stage 3 (moderate): Secondary | ICD-10-CM | POA: Diagnosis not present

## 2015-04-08 DIAGNOSIS — Z01812 Encounter for preprocedural laboratory examination: Secondary | ICD-10-CM | POA: Insufficient documentation

## 2015-04-08 DIAGNOSIS — Z7982 Long term (current) use of aspirin: Secondary | ICD-10-CM | POA: Diagnosis not present

## 2015-04-08 DIAGNOSIS — Z79899 Other long term (current) drug therapy: Secondary | ICD-10-CM | POA: Diagnosis not present

## 2015-04-08 HISTORY — DX: Gastro-esophageal reflux disease without esophagitis: K21.9

## 2015-04-08 LAB — COMPREHENSIVE METABOLIC PANEL
ALBUMIN: 3.3 g/dL — AB (ref 3.5–5.0)
ALK PHOS: 66 U/L (ref 38–126)
ALT: 10 U/L — AB (ref 14–54)
AST: 14 U/L — AB (ref 15–41)
Anion gap: 11 (ref 5–15)
BILIRUBIN TOTAL: 0.1 mg/dL — AB (ref 0.3–1.2)
BUN: 45 mg/dL — AB (ref 6–20)
CALCIUM: 9.3 mg/dL (ref 8.9–10.3)
CO2: 21 mmol/L — AB (ref 22–32)
Chloride: 104 mmol/L (ref 101–111)
Creatinine, Ser: 2.62 mg/dL — ABNORMAL HIGH (ref 0.44–1.00)
GFR calc Af Amer: 19 mL/min — ABNORMAL LOW (ref >60)
GFR calc non Af Amer: 17 mL/min — ABNORMAL LOW (ref >60)
Glucose, Bld: 213 mg/dL — ABNORMAL HIGH (ref 65–99)
Potassium: 5.6 mmol/L — ABNORMAL HIGH (ref 3.5–5.1)
SODIUM: 136 mmol/L (ref 135–145)
TOTAL PROTEIN: 6.7 g/dL (ref 6.5–8.1)

## 2015-04-08 LAB — CBC
HEMATOCRIT: 35.4 % — AB (ref 36.0–46.0)
HEMOGLOBIN: 11.9 g/dL — AB (ref 12.0–15.0)
MCH: 32.6 pg (ref 26.0–34.0)
MCHC: 33.6 g/dL (ref 30.0–36.0)
MCV: 97 fL (ref 78.0–100.0)
Platelets: 212 10*3/uL (ref 150–400)
RBC: 3.65 MIL/uL — AB (ref 3.87–5.11)
RDW: 14.7 % (ref 11.5–15.5)
WBC: 8 10*3/uL (ref 4.0–10.5)

## 2015-04-08 LAB — PROTIME-INR
INR: 1.07 (ref 0.00–1.49)
Prothrombin Time: 14.1 seconds (ref 11.6–15.2)

## 2015-04-08 LAB — SURGICAL PCR SCREEN
MRSA, PCR: NEGATIVE
Staphylococcus aureus: NEGATIVE

## 2015-04-08 LAB — GLUCOSE, CAPILLARY: Glucose-Capillary: 173 mg/dL — ABNORMAL HIGH (ref 65–99)

## 2015-04-08 LAB — APTT: aPTT: 36 seconds (ref 24–37)

## 2015-04-08 NOTE — Progress Notes (Signed)
Pt. Presents today with low BP  Left arm 84/42  and right arm 82/42.  Spoke with Donovan Kail PA  She will review Dr. Julious Payer note  And call office.

## 2015-04-08 NOTE — Progress Notes (Addendum)
Anesthesia PAT Evaluation: Patient is a 75 year old female scheduled for L4-5 decompression on 04/13/15 by Dr. Lorin Mercy.  History includes former smoker, CAD with acute anterolateral MI 05/16/02 with occluded D1 and LAD s/p DES Cypher stents to D1 (unsuccessful attempt to open LAD, ischemic CM, HTN, HLD, anemia, DM2, diabetic neuropathy, gout, PAD, tremor, GERD, skin cancer, cholecystectomy, bilateral corneal implants. BMI 36 (WT 221 lb, 96 kg), consistent with obesity.   PCP is Dr. Deland Pretty (previously seen by Dr. Minna Antis until he relocated to [place]). Patient reports that she was seen by Dr. Pennie Banter "assistant" Delmar Landau last Friday for hypotension and spironolactone was placed on hold.  Cardiologist is Dr. Mertie Moores, last visit 01/21/15. Quinapril increased to a total of 10 mg daily. Spironolactone 25 mg daily was also recently added to her regimen. Coreg and digoxin had been held by Dr. Minna Antis, but now back on Coreg 3.125 mg. She is also on propranolol per Dr. Minna Antis (she's not sure if it was started for her HTN or tremor). Next cardiology visit is scheduled for 05/17/15.  Meds include:  ASA 81 mg daily Coreg 3.125 mg BID  spironolactone 50 mg 1/2 tablet daily (on hold) quinapril 10 mg 1/2 tablet BID  propranolol ER 80 mg daily  Lasix 40 mg 1/2 tablet daily Plavix 75 mg daily (held 04/08/15) Neurontin 600 mg BID glyburide 5 mg BID with meals Metformin 1000 mg BID with meals Nitro 0.4 mg SL PRN  Exam: PAT Vitals: T 36.6C, HR 59, RR 20, O2 sat 99%. CBG 173. BP 84/36, 78/46, 84/42 LUE, 82/32 RUE. General: Patient is sitting in a wheelchair in no acute distress. No conversational dyspnea. Daughter-in-law Caren Griffins at side. CV: Heart RRR, no murmur appreciated, but heart sounds were distant. No carotid bruits noted. 1+ pedal edema. Pulm: Lungs overall clear, with diminished bases.  11/02/14 Echo: Study Conclusions - Left ventricle: The cavity size was normal. Wall thickness was increased in a  pattern of mild LVH. Systolic function was mildly to moderately reduced. The estimated ejection fraction was in the range of 40% to 45%. There is akinesis of the anteroseptal myocardium. Doppler parameters are consistent with abnormal left ventricular relaxation (grade 1 diastolic dysfunction). - Mitral valve: Calcified annulus. - Left atrium: The atrium was mildly dilated. Impressions: - Extremely limited due to poor sound wave transmission; anteroseptal akinesis with mild to moderate LV dysfunction; suggest MUGA or cardiac MRI to more fully assess; calcified aortic valve with no significant AS by mean gradient.  05/16/02 Cardiac cath: IMPRESSION: 1. Acute anterior lateral wall myocardial infarction secondary to total proximal left anterior descending occlusion. 2. Normal left circumflex coronary artery. 3. Nonobstructive 10% mid right coronary artery narrowing. 4. Acute hypo to akinesis involving the anterior lateral wall, apex, and septum with 1+ angiographic mitral valve prolapse. 5. Successful intervention in the proximal left anterior descending with the 100% occlusion being reduced to 0% and a 3.0 x 18 mm Cypher stent inserted in the proximal diagonal vessel, reduced to 0% with resultant TIMI-3 flow and a very large diagonal vessel extending to the left ventricular apex with total occlusion of the mid left anterior descending in a small left anterior descending system (done with double bolus Integrilin/weight adjusted heparinization).  07/30/02 Repeat catheterization by Dr. Rollene Fare for chest pain showed 30-40% left circumflex disease in the proximal region, 30% proximal RCA disease and mid disease, patent stent in the diagonal, bridging collaterals to the distal LAD, hypokinesis of the entire anterolateral wall with  akinesis towards the apex, ejection fraction 43%.  04/21/14 EKG: SR with first degree AVB, LAD, septal infarct (age undetermined). 01/21/15 EKG: "NSR at 64. Old  septal MI." Tracing pending from [facility].  Preoperative labs noted. K 5.6. BUN/Cr 45/2.62. Glucose 213. H/H 11.9/35.4. PT/PTT WNL.   I was asked to evaluate patient today during her PAT visit due to hypotension. Fortunately, she has been completely asymptomatic. Specifically denied chest pain, SOB, palpitations, syncope/presyncope, dizziness, visual changes. She reported that she had a "cold" 2-3 weeks ago and was taking 50 mg of Benadryl every four hours for newly two days. She was incoherent and even hallucinating for 1 1/2 days. She didn't realize Benadryl at that dose would effect her that way. She apparently did not eat much during those two days. Ever since her BP has been running low, but numbers are worse this morning. Since her spironolactone has been on hold for a week, most of her home BP readings have been ~ 100/50's. She was suppose to contact [facility] with her home readings, but hasn't done that yet. I called and spoke with Dr. Acie Fredrickson while patient was still at PAT. He recommended holding her quinapril and discontinuing her Coreg since she is already on propranolol. Currently spironolactone is also on hold. Dr. Acie Fredrickson did not feel he would need to see patient again prior to surgery as long as she stayed asymptomatic. He felt patient could continue to monitor her BP at home, and we would recheck her BP on arrival the day of surgery. (I asked her to call me if readings are consistently running < 100/60.) Dr. Acie Fredrickson classified patient as moderate CV risk for surgery. (When I spoke with Dr. Acie Fredrickson, I was still awaiting her BMET results. I told him I would contact Dr. Shelia Media if worsening renal function which I thought may be expected with recent event and likely renal hypoperfusion from hypotension.)   Following review of her labs showing worsening renal function with mild hyperkalemia, I contacted Dr. Shelia Media and notified him of these lab results, hypotension, Dr. Elmarie Shiley initial  recommendations, and currently scheduled surgery for 04/13/15. I asked if he could re-evaluate patient on Monday 04/11/15 for exam and follow-up labs. He will plan to have his office contact patient on Monday, and asked that I have the patient contact his office as well. Proceeding with surgery as scheduled will depend on follow-up BP and lab results.  I notified patient of her BMET results. Her spironolactone and lisinopril are on hold. Her Coreg has been stopped. I've also asked her to hold metformin at least until she is seen at Dr. Pennie Banter office on Monday, and to be compliant with her DM diet. She should take her list of BP and CBG results to that visit. If she is having significantly high glucose readings or high/low BP readings over the weekend, I recommending contacting Dr. Shelia Media office to have the on-call MD paged--or if acutely symptomatic then go to ED for evaluation. She was told to call Dr. Pennie Banter office on Monday to schedule her appointment. I have updated Malachy Mood at Dr. Duaine Dredge office.   [facility] Short Stay Center/Anesthesiology Phone [phone] 04/08/2015 2:39 PM  Addendum: Patient was re-evaluated by Dr. Shelia Media on 04/11/15. Repeat BMET showed a Cr 1.6, BUN 29, glucose 264. BP was 122/76. He did completely discontinue metformin and started her on Januvia. I called and spoke with Dr. Shelia Media this morning. He says patient has known CKD stage III, and since repeat  labs and BP have improved to baseline he did not see any medical contraindication for planned surgery. I left a voice message with Malachy Mood at Dr. Lorin Mercy office notifying her that I had received medical clearance.   George [facility] Short Stay Center/Anesthesiology Phone [phone] 04/12/2015 11:10 AM

## 2015-04-08 NOTE — Pre-Procedure Instructions (Signed)
Teresa Peck  04/08/2015      [facility] [address], [place] - [address] [place] [address] Phone: [phone] Fax: [place], [place] [address] Phone: [phone] Fax: [phone]    Your procedure is scheduled on 04-13-2015   Wednesday   Report to [facility] Admitting at 10:30 A.M.   Call this number if you have problems the morning of surgery:  [phone]   Remember:  Do not eat food or drink liquids after midnight.   Take these medicines the morning of surgery with A SIP OF WATER Carvedilol(Coreg),gabapentin(neurontin),Propranolol ER(Inderal)             Follow MD instructions on plavix                How to Manage Your Diabetes Before Surgery   Why is it important to control my blood sugar before and after surgery?   Improving blood sugar levels before and after surgery helps healing and can limit problems.  A way of improving blood sugar control is eating a healthy diet by:  - Eating less sugar and carbohydrates  - Increasing activity/exercise  - Talk with your doctor about reaching your blood sugar goals  High blood sugars (greater than 180 mg/dL) can raise your risk of infections and slow down your recovery so you will need to focus on controlling your diabetes during the weeks before surgery.  Make sure that the doctor who takes care of your diabetes knows about your planned surgery including the date and location.  How do I manage my blood sugars before surgery?   Check your blood sugar at least 4 times a day, 2 days before surgery to make sure that they are not too high or low.   Check your blood sugar the morning of your surgery when you wake up and every 2 hours until you get to the Short-Stay unit.  If your blood sugar is less than 70 mg/dL, you will need to treat for low blood  sugar by:  Treat a low blood sugar (less than 70 mg/dL) with 1/2 cup of clear juice (cranberry or apple), 4 glucose tablets, OR glucose gel.  Recheck blood sugar in 15 minutes after treatment (to make sure it is greater than 70 mg/dL).  If blood sugar is not greater than 70 mg/dL on re-check, call [phone] for further instructions.   Report your blood sugar to the Short-Stay nurse when you get to Short-Stay.  References:  [facility], 2007 "How to Manage your Diabetes Before and After Surgery".  What do I do about my diabetes medications?   Do not take oral diabetes medicines (pills) the morning of surgery.          .                         Do not wear jewelry, make-up or nail polish.  Do not wear lotions, powders, or perfumes.  You may not wear deodorant.  Do not shave 48 hours prior to surgery.     Do not bring valuables to the hospital.  [facility] is not responsible for any belongings or valuables.  Contacts, dentures or bridgework may not be worn into surgery.  Leave your suitcase in the car.  After surgery it may  be brought to your room.  For patients admitted to the hospital, discharge time will be determined by your treatment team.  Patients discharged the day of surgery will not be allowed to drive home.    Special instructions:  See attached sheet for instructions on CHG showers  Please read over the following fact sheets that you were given. Pain Booklet, Coughing and Deep Breathing and Surgical Site Infection Prevention

## 2015-04-09 LAB — HEMOGLOBIN A1C
HEMOGLOBIN A1C: 7.8 % — AB (ref 4.8–5.6)
MEAN PLASMA GLUCOSE: 177 mg/dL

## 2015-04-11 DIAGNOSIS — E1165 Type 2 diabetes mellitus with hyperglycemia: Secondary | ICD-10-CM | POA: Diagnosis not present

## 2015-04-11 DIAGNOSIS — N183 Chronic kidney disease, stage 3 (moderate): Secondary | ICD-10-CM | POA: Diagnosis not present

## 2015-04-11 DIAGNOSIS — I10 Essential (primary) hypertension: Secondary | ICD-10-CM | POA: Diagnosis not present

## 2015-04-12 MED ORDER — CEFAZOLIN SODIUM-DEXTROSE 2-3 GM-% IV SOLR
2.0000 g | INTRAVENOUS | Status: AC
  Administered 2015-04-13: 2 g via INTRAVENOUS
  Filled 2015-04-12: qty 50

## 2015-04-13 ENCOUNTER — Ambulatory Visit (HOSPITAL_COMMUNITY): Payer: Commercial Managed Care - HMO | Admitting: Certified Registered Nurse Anesthetist

## 2015-04-13 ENCOUNTER — Ambulatory Visit (HOSPITAL_COMMUNITY): Payer: Commercial Managed Care - HMO | Admitting: Vascular Surgery

## 2015-04-13 ENCOUNTER — Encounter (HOSPITAL_COMMUNITY)
Admission: RE | Disposition: A | Discharge status: Home / Self Care | Payer: Self-pay | Source: Ambulatory Visit | Attending: Orthopaedic Surgery

## 2015-04-13 ENCOUNTER — Observation Stay (HOSPITAL_COMMUNITY)
Admission: RE | Admit: 2015-04-13 | Discharge: 2015-04-15 | Disposition: A | Discharge status: Home / Self Care | Payer: Commercial Managed Care - HMO | Source: Ambulatory Visit | Attending: Orthopaedic Surgery | Admitting: Orthopaedic Surgery

## 2015-04-13 ENCOUNTER — Ambulatory Visit (HOSPITAL_COMMUNITY): Payer: Commercial Managed Care - HMO

## 2015-04-13 ENCOUNTER — Encounter (HOSPITAL_COMMUNITY): Payer: Self-pay | Admitting: *Deleted

## 2015-04-13 DIAGNOSIS — D649 Anemia, unspecified: Secondary | ICD-10-CM | POA: Diagnosis not present

## 2015-04-13 DIAGNOSIS — Z7902 Long term (current) use of antithrombotics/antiplatelets: Secondary | ICD-10-CM | POA: Diagnosis not present

## 2015-04-13 DIAGNOSIS — Z79899 Other long term (current) drug therapy: Secondary | ICD-10-CM | POA: Insufficient documentation

## 2015-04-13 DIAGNOSIS — Z01818 Encounter for other preprocedural examination: Secondary | ICD-10-CM | POA: Diagnosis not present

## 2015-04-13 DIAGNOSIS — Z7984 Long term (current) use of oral hypoglycemic drugs: Secondary | ICD-10-CM | POA: Diagnosis not present

## 2015-04-13 DIAGNOSIS — Z87891 Personal history of nicotine dependence: Secondary | ICD-10-CM | POA: Insufficient documentation

## 2015-04-13 DIAGNOSIS — M1990 Unspecified osteoarthritis, unspecified site: Secondary | ICD-10-CM | POA: Diagnosis not present

## 2015-04-13 DIAGNOSIS — Z7982 Long term (current) use of aspirin: Secondary | ICD-10-CM | POA: Diagnosis not present

## 2015-04-13 DIAGNOSIS — I252 Old myocardial infarction: Secondary | ICD-10-CM | POA: Diagnosis not present

## 2015-04-13 DIAGNOSIS — I110 Hypertensive heart disease with heart failure: Secondary | ICD-10-CM | POA: Insufficient documentation

## 2015-04-13 DIAGNOSIS — E1142 Type 2 diabetes mellitus with diabetic polyneuropathy: Secondary | ICD-10-CM | POA: Insufficient documentation

## 2015-04-13 DIAGNOSIS — I509 Heart failure, unspecified: Secondary | ICD-10-CM | POA: Diagnosis not present

## 2015-04-13 DIAGNOSIS — E1151 Type 2 diabetes mellitus with diabetic peripheral angiopathy without gangrene: Secondary | ICD-10-CM | POA: Insufficient documentation

## 2015-04-13 DIAGNOSIS — Z6836 Body mass index (BMI) 36.0-36.9, adult: Secondary | ICD-10-CM | POA: Insufficient documentation

## 2015-04-13 DIAGNOSIS — K219 Gastro-esophageal reflux disease without esophagitis: Secondary | ICD-10-CM | POA: Insufficient documentation

## 2015-04-13 DIAGNOSIS — E669 Obesity, unspecified: Secondary | ICD-10-CM | POA: Diagnosis not present

## 2015-04-13 DIAGNOSIS — I2510 Atherosclerotic heart disease of native coronary artery without angina pectoris: Secondary | ICD-10-CM | POA: Insufficient documentation

## 2015-04-13 DIAGNOSIS — Z419 Encounter for procedure for purposes other than remedying health state, unspecified: Secondary | ICD-10-CM

## 2015-04-13 DIAGNOSIS — M4806 Spinal stenosis, lumbar region: Secondary | ICD-10-CM | POA: Diagnosis not present

## 2015-04-13 DIAGNOSIS — M48062 Spinal stenosis, lumbar region with neurogenic claudication: Secondary | ICD-10-CM | POA: Diagnosis present

## 2015-04-13 HISTORY — PX: LUMBAR LAMINECTOMY/DECOMPRESSION MICRODISCECTOMY: SHX5026

## 2015-04-13 LAB — GLUCOSE, CAPILLARY
GLUCOSE-CAPILLARY: 180 mg/dL — AB (ref 65–99)
GLUCOSE-CAPILLARY: 154 mg/dL — AB (ref 65–99)
GLUCOSE-CAPILLARY: 124 mg/dL — AB (ref 65–99)
Glucose-Capillary: 216 mg/dL — ABNORMAL HIGH (ref 65–99)

## 2015-04-13 LAB — POCT I-STAT 4, (NA,K, GLUC, HGB,HCT)
Glucose, Bld: 211 mg/dL — ABNORMAL HIGH (ref 65–99)
HCT: 33 % — ABNORMAL LOW (ref 36.0–46.0)
Hemoglobin: 11.2 g/dL — ABNORMAL LOW (ref 12.0–15.0)
Potassium: 4.2 mmol/L (ref 3.5–5.1)
Sodium: 138 mmol/L (ref 135–145)

## 2015-04-13 SURGERY — LUMBAR LAMINECTOMY/DECOMPRESSION MICRODISCECTOMY
Anesthesia: General | Site: Spine Lumbar

## 2015-04-13 MED ORDER — MEPERIDINE HCL 25 MG/ML IJ SOLN
6.2500 mg | INTRAMUSCULAR | Status: DC | PRN
Start: 2015-04-13 — End: 2015-04-13

## 2015-04-13 MED ORDER — ACETAMINOPHEN 325 MG PO TABS
650.0000 mg | ORAL_TABLET | ORAL | Status: DC | PRN
  Administered 2015-04-14 – 2015-04-15 (×2): 650 mg via ORAL
  Filled 2015-04-13 (×2): qty 2

## 2015-04-13 MED ORDER — OXYCODONE-ACETAMINOPHEN 5-325 MG PO TABS
1.0000 | ORAL_TABLET | ORAL | Status: DC | PRN
  Administered 2015-04-13 – 2015-04-14 (×3): 1 via ORAL
  Administered 2015-04-14: 2 via ORAL
  Filled 2015-04-13 (×3): qty 1
  Filled 2015-04-13: qty 2

## 2015-04-13 MED ORDER — ONDANSETRON HCL 4 MG/2ML IJ SOLN
4.0000 mg | Freq: Once | INTRAMUSCULAR | Status: AC | PRN
  Administered 2015-04-13: 4 mg via INTRAVENOUS

## 2015-04-13 MED ORDER — LINAGLIPTIN 5 MG PO TABS
5.0000 mg | ORAL_TABLET | Freq: Every day | ORAL | Status: DC
  Administered 2015-04-13 – 2015-04-15 (×3): 5 mg via ORAL
  Filled 2015-04-13 (×4): qty 1

## 2015-04-13 MED ORDER — MIDAZOLAM HCL 2 MG/2ML IJ SOLN
INTRAMUSCULAR | Status: AC
  Filled 2015-04-13: qty 2

## 2015-04-13 MED ORDER — HYDROMORPHONE HCL 1 MG/ML IJ SOLN
0.2500 mg | INTRAMUSCULAR | Status: DC | PRN
  Administered 2015-04-13: 0.25 mg via INTRAVENOUS
  Administered 2015-04-13: 0.5 mg via INTRAVENOUS

## 2015-04-13 MED ORDER — LISINOPRIL 10 MG PO TABS: 10.0000 mg | ORAL_TABLET | Freq: Every day | ORAL | Status: DC

## 2015-04-13 MED ORDER — PHENOL 1.4 % MT LIQD: 1.0000 | OROMUCOSAL | Status: DC | PRN

## 2015-04-13 MED ORDER — LIDOCAINE HCL (CARDIAC) 20 MG/ML IV SOLN
INTRAVENOUS | Status: DC | PRN
  Administered 2015-04-13: 100 mg via INTRAVENOUS

## 2015-04-13 MED ORDER — GABAPENTIN 600 MG PO TABS
600.0000 mg | ORAL_TABLET | Freq: Two times a day (BID) | ORAL | Status: DC
  Administered 2015-04-13 – 2015-04-15 (×4): 600 mg via ORAL
  Filled 2015-04-13 (×5): qty 1

## 2015-04-13 MED ORDER — 0.9 % SODIUM CHLORIDE (POUR BTL) OPTIME
TOPICAL | Status: DC | PRN
  Administered 2015-04-13: 1000 mL

## 2015-04-13 MED ORDER — FENTANYL CITRATE (PF) 100 MCG/2ML IJ SOLN
INTRAMUSCULAR | Status: DC | PRN
Start: 2015-04-13 — End: 2015-04-13
  Administered 2015-04-13 (×2): 50 ug via INTRAVENOUS
  Administered 2015-04-13: 150 ug via INTRAVENOUS

## 2015-04-13 MED ORDER — POTASSIUM CHLORIDE IN NACL 20-0.45 MEQ/L-% IV SOLN
INTRAVENOUS | Status: DC
  Filled 2015-04-13 (×6): qty 1000

## 2015-04-13 MED ORDER — SUGAMMADEX SODIUM 500 MG/5ML IV SOLN
INTRAVENOUS | Status: AC
  Filled 2015-04-13: qty 5

## 2015-04-13 MED ORDER — LIDOCAINE HCL (CARDIAC) 20 MG/ML IV SOLN
INTRAVENOUS | Status: AC
  Filled 2015-04-13: qty 5

## 2015-04-13 MED ORDER — ONDANSETRON HCL 4 MG/2ML IJ SOLN
INTRAMUSCULAR | Status: AC
  Filled 2015-04-13: qty 2

## 2015-04-13 MED ORDER — CHLORHEXIDINE GLUCONATE 4 % EX LIQD: 60.0000 mL | Freq: Once | CUTANEOUS | Status: DC

## 2015-04-13 MED ORDER — GLYBURIDE 5 MG PO TABS
5.0000 mg | ORAL_TABLET | Freq: Every day | ORAL | Status: DC
  Administered 2015-04-14 – 2015-04-15 (×2): 5 mg via ORAL
  Filled 2015-04-13 (×3): qty 1

## 2015-04-13 MED ORDER — PROPRANOLOL HCL ER 80 MG PO CP24
80.0000 mg | ORAL_CAPSULE | Freq: Every day | ORAL | Status: DC
  Administered 2015-04-15: 80 mg via ORAL
  Filled 2015-04-13 (×3): qty 1

## 2015-04-13 MED ORDER — BUPIVACAINE HCL (PF) 0.25 % IJ SOLN
INTRAMUSCULAR | Status: AC
  Filled 2015-04-13: qty 30

## 2015-04-13 MED ORDER — MIDAZOLAM HCL 5 MG/5ML IJ SOLN
INTRAMUSCULAR | Status: DC | PRN
  Administered 2015-04-13 (×2): 1 mg via INTRAVENOUS

## 2015-04-13 MED ORDER — ONDANSETRON HCL 4 MG/2ML IJ SOLN
INTRAMUSCULAR | Status: DC | PRN
  Administered 2015-04-13: 4 mg via INTRAVENOUS

## 2015-04-13 MED ORDER — FAMOTIDINE 20 MG PO TABS
10.0000 mg | ORAL_TABLET | Freq: Every day | ORAL | Status: DC
  Administered 2015-04-13 – 2015-04-15 (×3): 10 mg via ORAL
  Filled 2015-04-13 (×3): qty 1

## 2015-04-13 MED ORDER — EPHEDRINE SULFATE 50 MG/ML IJ SOLN
INTRAMUSCULAR | Status: DC | PRN
  Administered 2015-04-13 (×3): 15 mg via INTRAVENOUS
  Administered 2015-04-13: 20 mg via INTRAVENOUS

## 2015-04-13 MED ORDER — PROPOFOL 10 MG/ML IV BOLUS
INTRAVENOUS | Status: AC
  Filled 2015-04-13: qty 20

## 2015-04-13 MED ORDER — SODIUM CHLORIDE 0.9% FLUSH: 3.0000 mL | INTRAVENOUS | Status: DC | PRN

## 2015-04-13 MED ORDER — DOCUSATE SODIUM 100 MG PO CAPS
100.0000 mg | ORAL_CAPSULE | Freq: Two times a day (BID) | ORAL | Status: DC
  Administered 2015-04-13 – 2015-04-15 (×4): 100 mg via ORAL
  Filled 2015-04-13 (×4): qty 1

## 2015-04-13 MED ORDER — HYDROMORPHONE HCL 1 MG/ML IJ SOLN
INTRAMUSCULAR | Status: AC
  Administered 2015-04-13: 0.25 mg via INTRAVENOUS
  Filled 2015-04-13: qty 1

## 2015-04-13 MED ORDER — SODIUM CHLORIDE 0.9 % IV SOLN: 250.0000 mL | INTRAVENOUS | Status: DC

## 2015-04-13 MED ORDER — MENTHOL 3 MG MT LOZG
1.0000 | LOZENGE | OROMUCOSAL | Status: DC | PRN
  Administered 2015-04-13: 3 mg via ORAL
  Filled 2015-04-13: qty 9

## 2015-04-13 MED ORDER — NITROGLYCERIN 0.4 MG SL SUBL: 0.4000 mg | SUBLINGUAL_TABLET | SUBLINGUAL | Status: DC | PRN

## 2015-04-13 MED ORDER — ONDANSETRON HCL 4 MG/2ML IJ SOLN
INTRAMUSCULAR | Status: AC
  Administered 2015-04-13: 4 mg via INTRAVENOUS
  Filled 2015-04-13: qty 2

## 2015-04-13 MED ORDER — LACTATED RINGERS IV SOLN
INTRAVENOUS | Status: DC
  Administered 2015-04-13: 11:00:00 via INTRAVENOUS

## 2015-04-13 MED ORDER — ONDANSETRON HCL 4 MG/2ML IJ SOLN: 4.0000 mg | INTRAMUSCULAR | Status: DC | PRN

## 2015-04-13 MED ORDER — ACETAMINOPHEN 650 MG RE SUPP: 650.0000 mg | RECTAL | Status: DC | PRN

## 2015-04-13 MED ORDER — KETOROLAC TROMETHAMINE 30 MG/ML IJ SOLN
INTRAMUSCULAR | Status: AC
  Administered 2015-04-13: 30 mg via INTRAVENOUS
  Filled 2015-04-13: qty 1

## 2015-04-13 MED ORDER — FENTANYL CITRATE (PF) 250 MCG/5ML IJ SOLN
INTRAMUSCULAR | Status: AC
  Filled 2015-04-13: qty 5

## 2015-04-13 MED ORDER — PROPOFOL 10 MG/ML IV BOLUS
INTRAVENOUS | Status: DC | PRN
  Administered 2015-04-13: 120 mg via INTRAVENOUS

## 2015-04-13 MED ORDER — KETOROLAC TROMETHAMINE 30 MG/ML IJ SOLN
30.0000 mg | Freq: Once | INTRAMUSCULAR | Status: AC
  Administered 2015-04-13: 30 mg via INTRAVENOUS

## 2015-04-13 MED ORDER — ROCURONIUM BROMIDE 100 MG/10ML IV SOLN
INTRAVENOUS | Status: DC | PRN
  Administered 2015-04-13: 50 mg via INTRAVENOUS

## 2015-04-13 MED ORDER — CLOPIDOGREL BISULFATE 75 MG PO TABS
75.0000 mg | ORAL_TABLET | Freq: Every day | ORAL | Status: DC
  Administered 2015-04-13 – 2015-04-15 (×3): 75 mg via ORAL
  Filled 2015-04-13 (×3): qty 1

## 2015-04-13 MED ORDER — FUROSEMIDE 40 MG PO TABS
40.0000 mg | ORAL_TABLET | Freq: Every day | ORAL | Status: DC
  Administered 2015-04-13 – 2015-04-15 (×3): 40 mg via ORAL
  Filled 2015-04-13 (×3): qty 1

## 2015-04-13 MED ORDER — MORPHINE SULFATE (PF) 2 MG/ML IV SOLN: 2.0000 mg | INTRAVENOUS | Status: DC | PRN

## 2015-04-13 MED ORDER — SODIUM CHLORIDE 0.9% FLUSH
3.0000 mL | Freq: Two times a day (BID) | INTRAVENOUS | Status: DC
  Administered 2015-04-14 (×2): 3 mL via INTRAVENOUS

## 2015-04-13 SURGICAL SUPPLY — 47 items
ADH SKN CLS APL DERMABOND .7 (GAUZE/BANDAGES/DRESSINGS) ×1
BUR ROUND FLUTED 4 SOFT TCH (BURR) IMPLANT
CLSR STERI-STRIP ANTIMIC 1/2X4 (GAUZE/BANDAGES/DRESSINGS) ×2 IMPLANT
COVER SURGICAL LIGHT HANDLE (MISCELLANEOUS) ×2 IMPLANT
DERMABOND ADVANCED (GAUZE/BANDAGES/DRESSINGS) ×1
DERMABOND ADVANCED .7 DNX12 (GAUZE/BANDAGES/DRESSINGS) ×1 IMPLANT
DRAPE MICROSCOPE LEICA (MISCELLANEOUS) ×2 IMPLANT
DRAPE PROXIMA HALF (DRAPES) ×4 IMPLANT
DRSG MEPILEX BORDER 4X4 (GAUZE/BANDAGES/DRESSINGS) ×2 IMPLANT
DRSG MEPILEX BORDER 4X8 (GAUZE/BANDAGES/DRESSINGS) ×2 IMPLANT
DURAPREP 26ML APPLICATOR (WOUND CARE) ×2 IMPLANT
DURASEAL SPINE SEALANT 3ML (MISCELLANEOUS) ×1 IMPLANT
ELECT REM PT RETURN 9FT ADLT (ELECTROSURGICAL) ×2
ELECTRODE REM PT RTRN 9FT ADLT (ELECTROSURGICAL) ×1 IMPLANT
GLOVE BIOGEL PI IND STRL 8 (GLOVE) ×2 IMPLANT
GLOVE BIOGEL PI INDICATOR 8 (GLOVE) ×2
GLOVE ORTHO TXT STRL SZ7.5 (GLOVE) ×4 IMPLANT
GOWN STRL REUS W/ TWL LRG LVL3 (GOWN DISPOSABLE) ×2 IMPLANT
GOWN STRL REUS W/ TWL XL LVL3 (GOWN DISPOSABLE) ×1 IMPLANT
GOWN STRL REUS W/TWL 2XL LVL3 (GOWN DISPOSABLE) ×2 IMPLANT
GOWN STRL REUS W/TWL LRG LVL3 (GOWN DISPOSABLE) ×4
GOWN STRL REUS W/TWL XL LVL3 (GOWN DISPOSABLE) ×2
KIT BASIN OR (CUSTOM PROCEDURE TRAY) ×2 IMPLANT
KIT ROOM TURNOVER OR (KITS) ×2 IMPLANT
MANIFOLD NEPTUNE II (INSTRUMENTS) ×2 IMPLANT
NDL HYPO 25GX1X1/2 BEV (NEEDLE) ×1 IMPLANT
NDL SPNL 18GX3.5 QUINCKE PK (NEEDLE) ×1 IMPLANT
NEEDLE HYPO 25GX1X1/2 BEV (NEEDLE) ×2 IMPLANT
NEEDLE SPNL 18GX3.5 QUINCKE PK (NEEDLE) ×2 IMPLANT
NS IRRIG 1000ML POUR BTL (IV SOLUTION) ×2 IMPLANT
PACK LAMINECTOMY ORTHO (CUSTOM PROCEDURE TRAY) ×2 IMPLANT
PAD ARMBOARD 7.5X6 YLW CONV (MISCELLANEOUS) ×4 IMPLANT
PATTIES SURGICAL .5 X.5 (GAUZE/BANDAGES/DRESSINGS) IMPLANT
PATTIES SURGICAL .75X.75 (GAUZE/BANDAGES/DRESSINGS) IMPLANT
SPONGE LAP 4X18 X RAY DECT (DISPOSABLE) ×1 IMPLANT
SUT BONE WAX W31G (SUTURE) IMPLANT
SUT NURALON 4 0 TR CR/8 (SUTURE) ×1 IMPLANT
SUT VIC AB 0 CT1 27 (SUTURE) ×2
SUT VIC AB 0 CT1 27XBRD ANBCTR (SUTURE) ×1 IMPLANT
SUT VIC AB 1 CTX 36 (SUTURE) ×2
SUT VIC AB 1 CTX36XBRD ANBCTR (SUTURE) IMPLANT
SUT VIC AB 2-0 CT1 27 (SUTURE) ×2
SUT VIC AB 2-0 CT1 TAPERPNT 27 (SUTURE) ×1 IMPLANT
SUT VIC AB 3-0 X1 27 (SUTURE) IMPLANT
TOWEL OR 17X24 6PK STRL BLUE (TOWEL DISPOSABLE) ×2 IMPLANT
TOWEL OR 17X26 10 PK STRL BLUE (TOWEL DISPOSABLE) ×2 IMPLANT
WATER STERILE IRR 1000ML POUR (IV SOLUTION) ×2 IMPLANT

## 2015-04-13 NOTE — Anesthesia Postprocedure Evaluation (Signed)
Anesthesia Post Note  Patient: Teresa Peck  Procedure(s) Performed: Procedure(s) (LRB): L4-5 Decompression (N/A)  Patient location during evaluation: PACU Anesthesia Type: General Level of consciousness: awake and alert and oriented Pain management: pain level controlled Vital Signs Assessment: post-procedure vital signs reviewed and stable Respiratory status: spontaneous breathing, nonlabored ventilation, respiratory function stable and patient connected to nasal cannula oxygen Cardiovascular status: blood pressure returned to baseline and stable Postop Assessment: no signs of nausea or vomiting Anesthetic complications: no    Last Vitals:  Filed Vitals:   04/13/15 1605 04/13/15 1610  BP: 98/40 118/57  Pulse: 66 64  Temp:    Resp: 16 12    Last Pain:  Filed Vitals:   04/13/15 1616  PainSc: 6                  Aura Bibby A.

## 2015-04-13 NOTE — Interval H&P Note (Signed)
History and Physical Interval Note:  04/13/2015 12:31 PM  Teresa Peck  has presented today for surgery, with the diagnosis of L4-5 Stenosis  The various methods of treatment have been discussed with the patient and family. After consideration of risks, benefits and other options for treatment, the patient has consented to  Procedure(s) with comments: L4-5 Decompression (N/A) - Need RNFA as a surgical intervention .  The patient's history has been reviewed, patient examined, no change in status, stable for surgery.  I have reviewed the patient's chart and labs.  Questions were answered to the patient's satisfaction.     YATES,MARK C

## 2015-04-13 NOTE — Op Note (Signed)
NAME:  Teresa Peck, Teresa Peck            ACCOUNT NO.:  [id number]  MEDICAL RECORD NO.:  [id number]  LOCATION:  [facility]                         FACILITY:  [facility]  PHYSICIAN:  Mark C. Lorin Mercy, M.D.    DATE OF BIRTH:  May 07, 1940  DATE OF PROCEDURE:  04/13/2015 DATE OF DISCHARGE:                              OPERATIVE REPORT   PREOPERATIVE DIAGNOSIS:  L4-5 stenosis with neurogenic claudication.  POSTOPERATIVE DIAGNOSIS:  L4-5 stenosis with neurogenic claudication.  PROCEDURE:  L4-5 decompression.  SURGEON:  Mark C. Lorin Mercy, M.D.  ASSISTANT:  April Fulp, RNFA.  EBL:  Less than 150.  FINDINGS:  Severe stenosis at L4-5 multifactorial, overhanging spurs, ligamentum hypertrophy with disk chronic bulge.  COMPLICATIONS:  A 1-mm dural tear right side just above the nerve root even with the superior aspect of the disk repaired with Nurolon suture watertight seal.  ANESTHESIA:  GOT plus 10 mL Marcaine local.  BRIEF HISTORY:  This 75 year old female, 95 kg, short stature with neurogenic claudication, diabetes, who has had progressive weakness to the point where she is having to ambulate with a rolling walker with a seat.  She has fallen.  Due to leg weakness, she can stand for 2-3 minutes.  The MRI scan showed severe multifactorial stenosis at L4-5. Moderate stenosis at L3-4 that was stable from previous MRI several years ago.  After informed consent, the patient was brought to the operating room for surgical treatment.  She did not have any instability, no anterolisthesis.  PROCEDURE IN DETAIL:  After induction of general anesthesia, orotracheal intubation, monitoring of sugars, the patient was placed in a prone position.  Ancef was given prophylactically, prepping with DuraPrep. The area was squared with towels, Betadine, Steri-Drape after sterile skin marker in the midline.  Extra straps were placed on the patient due to the short stature and body habitus to secure her on her chest rolls. Calf  bumpers were used.  Time-out procedure was completed  after laminectomy sheet was applied.  A spinal needle was placed at L4-5, taken an x-ray, it was slightly high and pulled out and readjusted. Repeat x-ray taken.  Skin was marked with a skin marker where the needle was placed.  Midline incision was made above and below the needle, and then once subperiosteal dissection on the lamina was performed releasing the fascia off the spinous process, two Kocher clamps were placed at the top and the bottom of planned decompression.  The patient had fairly narrow stenosis just at the level of the disk due to very thick ligamentum primarily with overhanging spurs off the facet.  Kochers were adjusted, confirmed with x-rays, and the bone was marked with a permanent marker with the Kochers perpendicular to the floor.  The spinous process of L4 was removed.  Top portion of L5 laminotomy was performed on the right and left.  At L5, thinning of the lamina with a bur which was very thick.  There were very large chunks of ligament once the bone was removed at the level of the pedicle.  Chunks of ligament were peeled from cephalad to caudad.  Dura was protected with patties. Operative microscope was draped and brought in and used for visualization as the lateral  walls overhanging spurs that were adherent to the dura were carefully removed.  The patient had cortisone injections in the past.  There was scar tissue and thinning of the dura. The gutter on the left side was decompressed.  Dura was round on that side and remaining chunks removed.  On the right side, the disk was bulging.  Overhanging spurs on the shoulder to the nerve root were removed.  There were no spurs laterally that were sharp.  The disk was being palpated with Penfield 4 directly with visualization of the dura, there was noted a 1-mm dural tear on the shoulder of the nerve root. Nurolon was placed with simple suture x2, tied, watertight  seal, Valsalva to 35 cm pressure with no leakage.  Some DuraSeal was mixed and just placed over the top of it.  The rest of the gutter was carefully inspected, there was complete decompression laterally.  Dura was round. There were areas of compression remaining.  Palpation with a hockey stick cephalad and caudad laterally out the foramina, there was good decompression of lateral recess.  No overhanging spurs pressing on the nerve root.  Irrigation with saline solution and then closure with #1 Vicryl interrupted sutures in the fascia, 2-0 Vicryl in the fat layer, two different layers were needed due to the thickness of the adipose layer, subcuticular closure, Dermabond on the skin, postop dressing, and transferred to the recovery room.  The patient tolerated the procedure well and was in stable condition.     Mark C. Lorin Mercy, M.D.     MCY/MEDQ  D:  04/13/2015  T:  04/13/2015  Job:  [id number]

## 2015-04-13 NOTE — Anesthesia Procedure Notes (Signed)
Procedure Name: Intubation Date/Time: 04/13/2015 12:49 PM Performed by: Salli Quarry Danilyn Cocke Pre-anesthesia Checklist: Patient identified, Emergency Drugs available, Suction available and Patient being monitored Patient Re-evaluated:Patient Re-evaluated prior to inductionOxygen Delivery Method: Circle system utilized Preoxygenation: Pre-oxygenation with 100% oxygen Intubation Type: IV induction Ventilation: Mask ventilation without difficulty Laryngoscope Size: Mac and 3 Grade View: Grade I Tube type: Oral Tube size: 7.5 mm Number of attempts: 1 Airway Equipment and Method: Stylet Placement Confirmation: ETT inserted through vocal cords under direct vision,  positive ETCO2 and breath sounds checked- equal and bilateral Secured at: 22 cm Tube secured with: Tape Dental Injury: Teeth and Oropharynx as per pre-operative assessment

## 2015-04-13 NOTE — Anesthesia Preprocedure Evaluation (Addendum)
Anesthesia Evaluation  Patient identified by MRN, date of birth, ID band Patient awake    Reviewed: Allergy & Precautions, NPO status   History of Anesthesia Complications Negative for: history of anesthetic complications  Airway Mallampati: I  TM Distance: >3 FB Neck ROM: Full    Dental  (+) Teeth Intact, Poor Dentition, Dental Advisory Given,    Pulmonary former smoker,    Pulmonary exam normal        Cardiovascular hypertension, + CAD, + Peripheral Vascular Disease and +CHF  Normal cardiovascular exam     Neuro/Psych  Neuromuscular disease    GI/Hepatic hiatal hernia, GERD  Medicated and Controlled,  Endo/Other  diabetes  Renal/GU      Musculoskeletal  (+) Arthritis ,   Abdominal   Peds  Hematology   Anesthesia Other Findings   Reproductive/Obstetrics                            Anesthesia Physical Anesthesia Plan  ASA: III  Anesthesia Plan: General   Post-op Pain Management:    Induction: Intravenous  Airway Management Planned: Oral ETT  Additional Equipment:   Intra-op Plan:   Post-operative Plan: Extubation in OR  Informed Consent: I have reviewed the patients History and Physical, chart, labs and discussed the procedure including the risks, benefits and alternatives for the proposed anesthesia with the patient or authorized representative who has indicated his/her understanding and acceptance.     Plan Discussed with: CRNA and Surgeon  Anesthesia Plan Comments:         Anesthesia Quick Evaluation

## 2015-04-13 NOTE — Brief Op Note (Signed)
04/13/2015  2:41 PM  PATIENT:  Teresa Peck  75 y.o. female  PRE-OPERATIVE DIAGNOSIS:  L4-5 Stenosis  POST-OPERATIVE DIAGNOSIS:  L4-5 Stenosis  PROCEDURE:  Procedure(s) with comments: L4-5 Decompression (N/A) - Need RNFA  SURGEON:  Surgeon(s) and Role:    * Marybelle Killings, MD - Primary  PHYSICIAN ASSISTANT:   ASSISTANTS: April Fulp RNFA   ANESTHESIA:   local and general  EBL:  Total I/O In: 1000 [I.V.:1000] Out: 250 [Blood:250]  BLOOD ADMINISTERED:none  DRAINS: none   LOCAL MEDICATIONS USED:  MARCAINE     SPECIMEN:  No Specimen  DISPOSITION OF SPECIMEN:  N/A  COUNTS:  YES  TOURNIQUET:  * No tourniquets in log *  DICTATION: .Other Dictation: Dictation Number [id number]  PLAN OF CARE: Admit to inpatient   PATIENT DISPOSITION:  PACU - hemodynamically stable.   Delay start of Pharmacological VTE agent (>24hrs) due to surgical blood loss or risk of bleeding: yes

## 2015-04-13 NOTE — Transfer of Care (Signed)
Immediate Anesthesia Transfer of Care Note  Patient: Teresa Peck  Procedure(s) Performed: Procedure(s) with comments: L4-5 Decompression (N/A) - Need RNFA  Patient Location: PACU  Anesthesia Type:General  Level of Consciousness: awake, alert , oriented and patient cooperative  Airway & Oxygen Therapy: Patient Spontanous Breathing and Patient connected to nasal cannula oxygen  Post-op Assessment: Report given to RN and Post -op Vital signs reviewed and stable  Post vital signs: Reviewed and stable  Last Vitals:  Filed Vitals:   04/13/15 1031  BP: 122/52  Pulse: 59  Temp: 36.2 C  Resp: 20    Complications: No apparent anesthesia complications

## 2015-04-13 NOTE — H&P (Addendum)
  [facility]   [address] [place], [place] [address] Telephone: [phone]  Fax: [phone]    Chief compliant:   Lumbar claudication from stenosis The patient returns with persistent problems with pain after prolonged standing, pain with walking.  She states that her pain may have improved slightly since last visit on 12/31/2014.   She had always had pain with standing, but states her last few months her pain had been "terrible."     Med list, allergies are reviewed, updated, and are unchanged.  See 12/31/2014  list.     CURRENT MEDICATIONS:  No change in her medications.  She is on Plavix.    PAST MEDICAL/SURGICAL HISTORY:  She does have diabetes.  She is followed by Dr. Deland Pretty.  Previous heart attack 2004.  Her last A1c was 7.1.     SOCIAL HISTORY:  The patient is divorced.  Retired.  Does not smoke.  She has truncal obesity, increased BMI.  She has palpable pulses.  Negative nerve root tension signs.  Decreased sensation in stocking distribution consistent with some neuropathy.     RADIOGRAPHS:  The MRI scan is reviewed.  It shows severe multifactorial stenosis at L4-5.  She has some diskogenic edema in the marrow and canal AP diameter is 12-14 mm, level at other areas, and is narrowed down to 4 mm at L4-5.     ASSESSMENT:  Neurogenic claudication.     PLAN:  She asked about what medicines will take the pain away.  She understands that she has an area of stenosis, she would require a single level decompression to correct this.  If she decided she wanted to proceed, she would need to have cardiology clearance.  She understands that this will allow her to stand longer, walk farther and give her pain relief.  She could get some restenosis over a number of years.  Procedure discussed, use of the operative microscope, overnight stay, plastic surgery closure.  The patient asks if this is the same procedure that the girls with pictures in a bikini on the beach  receive and I discussed with her that her incision would be larger than the one pictured on the advertisement.  She can think about the procedure, call if she has any questions, and call if she would like to schedule.     For additional information please see handwritten notes, reports, orders and prescriptions in this chart.      Hillel Card C. Lorin Mercy, M.D.    Auto-Authenticated by Thana Farr. Lorin Mercy, M.D.  MCY/sw DD: 02/18/2015  DT: 02/18/2015

## 2015-04-14 ENCOUNTER — Encounter (HOSPITAL_COMMUNITY): Payer: Self-pay | Admitting: Orthopaedic Surgery

## 2015-04-14 DIAGNOSIS — E1142 Type 2 diabetes mellitus with diabetic polyneuropathy: Secondary | ICD-10-CM | POA: Diagnosis not present

## 2015-04-14 DIAGNOSIS — M1990 Unspecified osteoarthritis, unspecified site: Secondary | ICD-10-CM | POA: Diagnosis not present

## 2015-04-14 DIAGNOSIS — E669 Obesity, unspecified: Secondary | ICD-10-CM | POA: Diagnosis not present

## 2015-04-14 DIAGNOSIS — M4806 Spinal stenosis, lumbar region: Secondary | ICD-10-CM | POA: Diagnosis not present

## 2015-04-14 DIAGNOSIS — I110 Hypertensive heart disease with heart failure: Secondary | ICD-10-CM | POA: Diagnosis not present

## 2015-04-14 DIAGNOSIS — I509 Heart failure, unspecified: Secondary | ICD-10-CM | POA: Diagnosis not present

## 2015-04-14 DIAGNOSIS — I2510 Atherosclerotic heart disease of native coronary artery without angina pectoris: Secondary | ICD-10-CM | POA: Diagnosis not present

## 2015-04-14 DIAGNOSIS — Z6836 Body mass index (BMI) 36.0-36.9, adult: Secondary | ICD-10-CM | POA: Diagnosis not present

## 2015-04-14 DIAGNOSIS — R269 Unspecified abnormalities of gait and mobility: Secondary | ICD-10-CM | POA: Diagnosis not present

## 2015-04-14 DIAGNOSIS — E1151 Type 2 diabetes mellitus with diabetic peripheral angiopathy without gangrene: Secondary | ICD-10-CM | POA: Diagnosis not present

## 2015-04-14 LAB — BASIC METABOLIC PANEL
ANION GAP: 10 (ref 5–15)
BUN: 18 mg/dL (ref 6–20)
CALCIUM: 8.8 mg/dL — AB (ref 8.9–10.3)
CO2: 21 mmol/L — AB (ref 22–32)
CREATININE: 1.5 mg/dL — AB (ref 0.44–1.00)
Chloride: 106 mmol/L (ref 101–111)
GFR, EST AFRICAN AMERICAN: 38 mL/min — AB (ref >60)
GFR, EST NON AFRICAN AMERICAN: 33 mL/min — AB (ref >60)
Glucose, Bld: 101 mg/dL — ABNORMAL HIGH (ref 65–99)
Potassium: 4.2 mmol/L (ref 3.5–5.1)
SODIUM: 137 mmol/L (ref 135–145)

## 2015-04-14 LAB — CBC
HEMATOCRIT: 35.4 % — AB (ref 36.0–46.0)
Hemoglobin: 11.7 g/dL — ABNORMAL LOW (ref 12.0–15.0)
MCH: 31.9 pg (ref 26.0–34.0)
MCHC: 33.1 g/dL (ref 30.0–36.0)
MCV: 96.5 fL (ref 78.0–100.0)
Platelets: 178 10*3/uL (ref 150–400)
RBC: 3.67 MIL/uL — ABNORMAL LOW (ref 3.87–5.11)
RDW: 15 % (ref 11.5–15.5)
WBC: 7.6 10*3/uL (ref 4.0–10.5)

## 2015-04-14 LAB — GLUCOSE, CAPILLARY
GLUCOSE-CAPILLARY: 146 mg/dL — AB (ref 65–99)
GLUCOSE-CAPILLARY: 204 mg/dL — AB (ref 65–99)
Glucose-Capillary: 163 mg/dL — ABNORMAL HIGH (ref 65–99)
Glucose-Capillary: 172 mg/dL — ABNORMAL HIGH (ref 65–99)

## 2015-04-14 MED ORDER — HYDROCODONE-ACETAMINOPHEN 5-325 MG PO TABS: 1.0000 | ORAL_TABLET | ORAL | Status: DC | PRN

## 2015-04-14 NOTE — Care Management Note (Signed)
Case Management Note  Patient Details  Name: SHRIKA TANNA MRN: [id number] Date of Birth: 16-Nov-1940  Subjective/Objective:    75 yr old female s/p L4-L5 lumbar decompression.                Action/Plan: Case manager spoke with patient and family concerning need for home health and DME,. Choice was offered. Referral was called to Estrella Myrtle, [place] Specialist. Patient will have family support at discharge.    Expected Discharge Date:  04/15/15               Expected Discharge Plan:  [place]  In-House Referral:     Discharge planning Services  CM Consult  Post Acute Care Choice:  Durable Medical Equipment, Home Health Choice offered to:  Patient  DME Arranged:  Walker rolling DME Agency:  [place]:  PT [facility] Agency:  [place]  Status of Service:  Completed, signed off  Medicare Important Message Given:    Date Medicare IM Given:    Medicare IM give by:    Date Additional Medicare IM Given:    Additional Medicare Important Message give by:     If discussed at [place] of Stay Meetings, dates discussed:    Additional Comments:  Ninfa Meeker, RN 04/14/2015, 12:25 PM

## 2015-04-14 NOTE — Progress Notes (Signed)
Physical Therapy Treatment Patient Details Name: Teresa Peck MRN: [id number] DOB: 07/30/1940 Today's Date: 04/14/2015    History of Present Illness 75 y.o. female admitted to [facility] on 04/13/15 for elective lumbar  (L4-5) decompression.  Pt with significant PMHx of CAD, HTN, anemia, DM, gout, diabetic neuropathy, CHF, PAD, tremor, MI s/p cardiac cath with stent, and R knee surgery.    PT Comments    Patient with changes in cognition and disoriented X 3. Required +2 assist for safety with pt not responding appropriately to instruction. RN aware of further changes from pt's baseline.  Follow Up Recommendations  Home health PT;Supervision/Assistance - 24 hour     Equipment Recommendations  Rolling walker with 5" wheels    Recommendations for Other Services       Precautions / Restrictions Precautions Precautions: Fall;Back Precaution Comments: pt is unsteady on her feet, even with RW    Mobility  Bed Mobility Overal bed mobility: Needs Assistance Bed Mobility: Rolling;Sidelying to Sit Rolling: Min assist;+2 for safety/equipment Sidelying to sit: Min assist;Mod assist;+2 for physical assistance;+2 for safety/equipment       General bed mobility comments: pt not following commands; +2 to maintain back precautions; pt with impulsivity   Transfers Overall transfer level: Needs assistance Equipment used: Rolling walker (2 wheeled) Transfers: Stand Pivot Transfers   Stand pivot transfers: Min assist;+2 physical assistance;+2 safety/equipment       General transfer comment: assist to pivot safely with vc for sequencing of gait with use of AD  Ambulation/Gait             General Gait Details: deferred for patient/therapist safety   Stairs            Wheelchair Mobility    Modified Rankin (Stroke Patients Only)       Balance Overall balance assessment: Needs assistance Sitting-balance support: Bilateral upper extremity supported;Feet  supported Sitting balance-Leahy Scale: Fair     Standing balance support: Bilateral upper extremity supported Standing balance-Leahy Scale: Poor                      Cognition Arousal/Alertness: Awake/alert Behavior During Therapy: Restless;Impulsive Overall Cognitive Status: Impaired/Different from baseline Area of Impairment: Attention;Safety/judgement;Awareness;Following commands;Orientation Orientation Level: Disoriented to;Place;Time;Situation Current Attention Level: Alternating Memory: Decreased short-term memory;Decreased recall of precautions   Safety/Judgement: Decreased awareness of safety;Decreased awareness of deficits Awareness: Intellectual   General Comments: pt not responding appropriately to commands and unaware of situation    Exercises      General Comments General comments (skin integrity, edema, etc.): pt demonstrated decreased cognition this session with pt not responding appropriately to commands and disoriented to time, place, and situation; RN aware; RN and NT provided assistance for safe transfers and to calm pt down as she was trying multiple times to get out of bed with legs over bed rails; niece and daughter in law present      Pertinent Vitals/Pain Pain Assessment: Faces Faces Pain Scale: Hurts a little bit Pain Location: back Pain Descriptors / Indicators: Grimacing Pain Intervention(s): Monitored during session;Repositioned    Home Living                      Prior Function            PT Goals (current goals can now be found in the care plan section) Acute Rehab PT Goals Patient Stated Goal: none stated PT Goal Formulation: With patient Time For Goal Achievement: 04/21/15 Potential to  Achieve Goals: Good Progress towards PT goals: PT to reassess next treatment    Frequency  Min 5X/week    PT Plan Current plan remains appropriate    Co-evaluation             End of Session Equipment Utilized During  Treatment: Gait belt Activity Tolerance: Other (comment) (limited by cognitive deficits) Patient left: with call bell/phone within reach;in bed;with bed alarm set;with family/visitor present;with SCD's reapplied     Time: [id number] PT Time Calculation (min) (ACUTE ONLY): 28 min  Charges:  $Therapeutic Activity: 23-37 mins                    G Codes:  Functional Assessment Tool Used: assist level Functional Limitation: Mobility: Walking and moving around Mobility: Walking and Moving Around Current Status [id number]): At least 20 percent but less than 40 percent impaired, limited or restricted Mobility: Walking and Moving Around Goal Status [phone]): At least 1 percent but less than 20 percent impaired, limited or restricted   Salina April, PTA Pager: [phone]   04/14/2015, 4:32 PM

## 2015-04-14 NOTE — Progress Notes (Signed)
OT made RN aware that pt had lost balance in bathroom during session, was disoriented, and was very unsafe during ambulation. Family expressed concerns that she was not ready to be discharged today. Dr. Lorin Mercy made aware of situation and mental status changes, stated it was ok to stay another night. Will attempt to use tylenol prn for pain instead of percocet.   [place], Jerry Caras

## 2015-04-14 NOTE — Progress Notes (Signed)
Patient ID: Teresa Peck, female   DOB: Feb 18, 1941, 75 y.o.   MRN: [id number] RN called me. With ambulation she tires and family was concerned she was not back to her normal self after pain medication etc. Will hold on discharge til AM

## 2015-04-14 NOTE — Progress Notes (Signed)
Occupational Therapy Evaluation Patient Details Name: Teresa Peck MRN: [id number] DOB: Mar 16, 1940 Today's Date: 04/14/2015    History of Present Illness 75 y.o. female admitted to [facility] on 04/13/15 for elective lumbar  (L4-5) decompression.  Pt with significant PMHx of CAD, HTN, anemia, DM, gout, diabetic neuropathy, CHF, PAD, tremor, MI s/p cardiac cath with stent, and R knee surgery.   Clinical Impression   Pt admitted with the above diagnoses and presents with below problem list. Pt will benefit from continued acute OT to address the below listed deficits and maximize independence with BADLs prior to d/c to venue below. PTA pt was mod I to independent with ADLs. Pt is currently min guard to mod A with ADLs. Pt with impaired cognition and decreased standing balance impacting safety and level of assist with ADLs. See session details below. Family reports pt is not at baseline with cognition and expressed concern with her discharging today. Feel pt would benefit from additional overnight stay. Pt with 1 LOB during pericare requiring mod A to prevent a fall. Cues needed throughout session for precautions and safety. OT to continue to follow acutely.      Follow Up Recommendations  Home health OT;Supervision/Assistance - 24 hour    Equipment Recommendations  3 in 1 bedside comode    Recommendations for Other Services       Precautions / Restrictions Precautions Precautions: Fall;Back Precaution Booklet Issued: Yes (comment) Precaution Comments: pt is unsteady on her feet, even with RW Restrictions Weight Bearing Restrictions: No      Mobility Bed Mobility Overal bed mobility: Needs Assistance Bed Mobility: Rolling;Sidelying to Sit Rolling: Min assist Sidelying to sit: Min assist       General bed mobility comments: in recliner  Transfers Overall transfer level: Needs assistance Equipment used: Rolling walker (2 wheeled) Transfers: Sit to/from Stand Sit to Stand: Min  guard         General transfer comment: min guard for sit<>stand from recliner and 3n1    Balance Overall balance assessment: Needs assistance Sitting-balance support: No upper extremity supported;Feet supported Sitting balance-Leahy Scale: Good     Standing balance support: Bilateral upper extremity supported;During functional activity;Single extremity supported Standing balance-Leahy Scale: Poor Standing balance comment: min guard for static standing balance with rw. 1 LOB during pericare in sit<>stand position with single extremity support and min - mod A for balance from therapist.                             ADL Overall ADL's : Needs assistance/impaired Eating/Feeding: Set up;Sitting   Grooming: Min guard;Cueing for safety;Cueing for compensatory techniques;Standing Grooming Details (indicate cue type and reason): cueing needed to avoid twisting with pt approaching sink from the side. Upper Body Bathing: Minimal assitance;Sitting;Cueing for compensatory techniques;Cueing for safety   Lower Body Bathing: Moderate assistance;Sit to/from stand;Cueing for compensatory techniques;Cueing for safety   Upper Body Dressing : Minimal assistance;Sitting   Lower Body Dressing: Moderate assistance;Cueing for safety;Cueing for sequencing;Cueing for compensatory techniques;Sit to/from stand   Toilet Transfer: Min guard;Ambulation;BSC;RW   Toileting- Clothing Manipulation and Hygiene: Moderate assistance;Sit to/from stand Toileting - Clothing Manipulation Details (indicate cue type and reason): Pt with 1 LOB during pericare in sit<>stand with mod A needed to prevent a fall.      Functional mobility during ADLs: Min guard;Minimal assistance;Rolling walker General ADL Comments: Pt with impaired cognition and balance this session impacting level of assist with ADLs this session.  Pt and family report baseline difficulty accessing feet. Cues needed throughout session to maintain  precautions. Impulsive at times. 1 LOB during pericare in sit<>stand. Pt carrying rw at times despite cueing. Will need reinforcement of back precautions and ADL education.     Vision     Perception     Praxis      Pertinent Vitals/Pain Pain Assessment: No/denies pain (No pain at rest. "It (Pain) grabs me sometimes.") Pain Score: 9  Pain Location: back Pain Descriptors / Indicators: Aching;Burning Pain Intervention(s): Limited activity within patient's tolerance;Monitored during session;Premedicated before session;RN gave pain meds during session     Hand Dominance     Extremity/Trunk Assessment Upper Extremity Assessment Upper Extremity Assessment: Overall WFL for tasks assessed;Generalized weakness   Lower Extremity Assessment Lower Extremity Assessment: Defer to PT evaluation   Cervical / Trunk Assessment Cervical / Trunk Assessment: Normal   Communication Communication Communication: No difficulties   Cognition Arousal/Alertness: Awake/alert Behavior During Therapy: Impulsive Overall Cognitive Status: Impaired/Different from baseline Area of Impairment: Attention;Safety/judgement;Awareness;Problem solving;Memory   Current Attention Level: Sustained Memory: Decreased short-term memory   Safety/Judgement: Decreased awareness of safety;Decreased awareness of deficits Awareness: Intellectual Problem Solving: Difficulty sequencing;Requires verbal cues General Comments: Delayed recall of birth month and day, assist to recall birth year and age. Pt needing max cues at times to maintain precautions and due to impulsivity with pt attempting to quickly stand and walk. Pt bumping into large obstacles in room with difficulty problem solving navigating around obstacles using rw. Pt chuckling with flat expression at times during session. Family report pt is not at baseline with cognition.    General Comments       Exercises       Shoulder Instructions      Home Living  Family/patient expects to be discharged to:: Private residence Living Arrangements: Alone Available Help at Discharge: Family;Available 24 hours/day;Available PRN/intermittently;Other (Comment) (daughter-in-law and brother live next door) Type of Home: House Home Access: Stairs to enter [employer] of Steps: 3 Entrance Stairs-Rails: Right;Left;Can reach both Home Layout: One level     Bathroom Shower/Tub: Walk-in shower;Door   [employer] Toilet: Standard     Home Equipment: Environmental consultant - standard (family reports no BSC)   Additional Comments: Pt's niece and daughter-in-law present and report daughter-in-law and brother live next door. They report the plan is to check in on pt as needed with daughter-in-law reporting they can can stay with pt "as needed."      Prior Functioning/Environment Level of Independence: Independent        Comments: mod I with LB ADLs due to difficulty accessing feet    OT Diagnosis: Generalized weakness;Acute pain   OT Problem List: Impaired balance (sitting and/or standing);Decreased knowledge of use of DME or AE;Decreased knowledge of precautions;Decreased safety awareness;Decreased cognition;Pain   OT Treatment/Interventions: Self-care/ADL training;DME and/or AE instruction;Therapeutic activities;Patient/family education;Balance training    OT Goals(Current goals can be found in the care plan section) Acute Rehab OT Goals Patient Stated Goal: home OT Goal Formulation: With patient/family Time For Goal Achievement: 04/21/15 Potential to Achieve Goals: Good ADL Goals Pt Will Perform Grooming: with modified independence;sitting;standing Pt Will Perform Upper Body Bathing: with modified independence;sitting Pt Will Perform Lower Body Bathing: with modified independence;sit to/from stand;with adaptive equipment Pt Will Perform Upper Body Dressing: with modified independence;sitting Pt Will Perform Lower Body Dressing: with modified  independence;with adaptive equipment;sit to/from stand Pt Will Transfer to Toilet: with modified independence;ambulating;bedside commode Pt Will Perform Toileting - Clothing Manipulation and  hygiene: with modified independence;sit to/from stand;with adaptive equipment;sitting/lateral leans Pt Will Perform Tub/Shower Transfer: with supervision;ambulating;3 in 1;rolling walker Additional ADL Goal #1: Pt will complete bed mobility at mod I level to prepare for OOB ADLs.   OT Frequency: Min 2X/week   Barriers to D/C:    Unsure level of assist family can provide: 24 hour vs. intermittent       Co-evaluation              End of Session Equipment Utilized During Treatment: Gait belt;Rolling walker Nurse Communication: Other (comment);Mobility status (cognition, LOB during pericare, d/c timeline)  Activity Tolerance: Patient tolerated treatment well;Other (comment) (some dizziness during mobility, resolved by end of session) Patient left: in chair;with call bell/phone within reach;with chair alarm set;with family/visitor present   Time: 1210-1230 OT Time Calculation (min): 20 min Charges:  OT General Charges $OT Visit: 1 Procedure OT Evaluation $OT Eval Low Complexity: 1 Procedure G-Codes:    Hortencia Pilar 05/06/15, 1:08 PM

## 2015-04-14 NOTE — Evaluation (Signed)
Physical Therapy Evaluation Patient Details Name: Teresa Peck MRN: [id number] DOB: 08-27-1940 Today's Date: 04/14/2015   History of Present Illness  75 y.o. female admitted to [facility] on 04/13/15 for elective lumbar  (L4-5) decompression.  Pt with significant PMHx of CAD, HTN, anemia, DM, gout, diabetic neuropathy, CHF, PAD, tremor, MI s/p cardiac cath with stent, and R knee surgery.  Clinical Impression  Pt is moderately confused, and imbalanced, making her a high fall risk.  She does better with use of the RW (requiring light min assist vs heavy min assist without it).  Her niece reports her daughter in law will be there with her all the time while she is recovering at home.  I would recommend HHPT as pt is such a high fall risk at this time.   PT to follow acutely for deficits listed below.       Follow Up Recommendations Home health PT;Supervision/Assistance - 24 hour    Equipment Recommendations  Rolling walker with 5" wheels    Recommendations for Other Services   NA    Precautions / Restrictions Precautions Precautions: Fall;Back Precaution Comments: pt is unsteady on her feet, even with RW      Mobility  Bed Mobility Overal bed mobility: Needs Assistance Bed Mobility: Rolling;Sidelying to Sit Rolling: Min assist Sidelying to sit: Min assist       General bed mobility comments: Min assist to support truk during transitions.  Verbal cues for log roll technique to protect back.    Transfers Overall transfer level: Needs assistance Equipment used: Rolling walker (2 wheeled) Transfers: Sit to/from Stand Sit to Stand: Min guard         General transfer comment: Min guard assist for safety and balance.   Ambulation/Gait Ambulation/Gait assistance: Min assist Ambulation Distance (Feet): 80 Feet Assistive device: Rolling walker (2 wheeled) Gait Pattern/deviations: Step-through pattern;Shuffle;Trunk flexed Gait velocity: decreased Gait velocity interpretation:  Below normal speed for age/gender General Gait Details: Pt with flexed gait pattern, min assist mostly to help her steer RW around obstacles.  Verbal cues to focus on walking as she was steering RW into walls, door frames, etc.  Verbal cues for upright posture and closer proximity to RW.   Stairs Stairs: Yes Stairs assistance: Min assist Stair Management: Two rails;Step to pattern;Forwards Number of Stairs: 5 (x2) General stair comments: Min assist for balance and stability on stairs.  PMHx of bil knee pain showed on the stairs and pt reported her knees did not like stairs and she had difficulty with them at baseline.          Balance Overall balance assessment: Needs assistance Sitting-balance support: Feet supported;No upper extremity supported Sitting balance-Leahy Scale: Good     Standing balance support: Bilateral upper extremity supported;Single extremity supported Standing balance-Leahy Scale: Poor Standing balance comment: Attempted short distance gait with only HHA to determine if she needed the support of the RW and she does. She is heavy mod hand held assist without the RW for support during gait.                              Pertinent Vitals/Pain Pain Assessment: 0-10 Pain Score: 9  Pain Location: low back Pain Descriptors / Indicators: Aching;Burning Pain Intervention(s): Limited activity within patient's tolerance;Monitored during session;Premedicated before session;RN gave pain meds during session    [place] expects to be discharged to:: Private residence Living Arrangements: Alone Available Help at Discharge:  Family;Available 24 hours/day (daughter in law will be there, per pt's niece) Type of Home: House Home Access: Stairs to enter Entrance Stairs-Rails: Right;Left;Can reach both Entrance Stairs-Number of Steps: 3 Home Layout: One level Home Equipment: Walker - standard;Bedside commode      Prior Function Level of  Independence: Independent                  Extremity/Trunk Assessment   Upper Extremity Assessment: Defer to OT evaluation           Lower Extremity Assessment: Generalized weakness (h/o bil R>L knee pain/dysfunction)      Cervical / Trunk Assessment: Normal  Communication   Communication: No difficulties  Cognition Arousal/Alertness: Awake/alert Behavior During Therapy: WFL for tasks assessed/performed Overall Cognitive Status: Impaired/Different from baseline Area of Impairment: Attention;Safety/judgement;Awareness   Current Attention Level: Sustained Memory: Decreased short-term memory   Safety/Judgement: Decreased awareness of safety;Decreased awareness of deficits Awareness: Intellectual   General Comments: Pt reading every sign in the hallway, mistaking niece's name at times.     General Comments General comments (skin integrity, edema, etc.): Attempted to review back precautions with pt, but she was easily distracted.  She will need reinforcement. [place] left on bedside table.           Assessment/Plan    PT Assessment Patient needs continued PT services  PT Diagnosis Difficulty walking;Abnormality of gait;Generalized weakness;Acute pain;Altered mental status   PT Problem List Decreased strength;Decreased activity tolerance;Decreased balance;Decreased mobility;Decreased cognition;Decreased knowledge of use of DME;Decreased knowledge of precautions;Decreased safety awareness;Pain  PT Treatment Interventions DME instruction;Gait training;Stair training;Functional mobility training;Therapeutic activities;Balance training;Therapeutic exercise;Neuromuscular re-education;Cognitive remediation;Patient/family education;Modalities   PT Goals (Current goals can be found in the Care Plan section) Acute Rehab PT Goals Patient Stated Goal: to go home, possibly today PT Goal Formulation: With patient Time For Goal Achievement: 04/21/15 Potential to Achieve Goals:  Good    Frequency Min 5X/week           End of Session Equipment Utilized During Treatment: Gait belt Activity Tolerance: Patient limited by pain Patient left: in chair;with call bell/phone within reach;with chair alarm set Nurse Communication: Mobility status    Functional Assessment Tool Used: assist level Functional Limitation: Mobility: Walking and moving around Mobility: Walking and Moving Around Current Status [phone]): At least 20 percent but less than 40 percent impaired, limited or restricted Mobility: Walking and Moving Around Goal Status [phone]): At least 1 percent but less than 20 percent impaired, limited or restricted    Time: 0926-0949 PT Time Calculation (min) (ACUTE ONLY): 23 min   Charges:   PT Evaluation $PT Eval Moderate Complexity: 1 Procedure PT Treatments $Gait Training: 8-22 mins   PT G Codes:   PT G-Codes **NOT FOR INPATIENT CLASS** Functional Assessment Tool Used: assist level Functional Limitation: Mobility: Walking and moving around Mobility: Walking and Moving Around Current Status [id number]): At least 20 percent but less than 40 percent impaired, limited or restricted Mobility: Walking and Moving Around Goal Status [phone]): At least 1 percent but less than 20 percent impaired, limited or restricted    Eilis Chestnutt B. [place], [place], DPT [phone]   04/14/2015, 10:08 AM

## 2015-04-14 NOTE — Progress Notes (Signed)
Subjective: 1 Day Post-Op Procedure(s) (LRB): L4-5 Decompression (N/A) Patient reports pain as mild.  My legs feel good , I want to go home today.   Objective: Vital signs in last 24 hours: Temp:  [97.2 F (36.2 C)-98.6 F (37 C)] 98.6 F (37 C) (02/23 0600) Pulse Rate:  [59-70] 70 (02/23 0600) Resp:  [11-20] 16 (02/23 0600) BP: (95-122)/(39-67) 115/46 mmHg (02/23 0600) SpO2:  [88 %-100 %] 97 % (02/23 0600) Weight:  [95.709 kg (211 lb)] 95.709 kg (211 lb) (02/22 1046)  Intake/Output from previous day: 02/22 0701 - 02/23 0700 In: 2000 [I.V.:2000] Out: 900 [Urine:650; Blood:250] Intake/Output this shift:     Recent Labs  04/13/15 1324 04/14/15 0500  HGB 11.2* 11.7*    Recent Labs  04/13/15 1324 04/14/15 0500  WBC  --  7.6  RBC  --  3.67*  HCT 33.0* 35.4*  PLT  --  178    Recent Labs  04/13/15 1324 04/14/15 0500  NA 138 137  K 4.2 4.2  CL  --  106  CO2  --  21*  BUN  --  18  CREATININE  --  1.50*  GLUCOSE 211* 101*  CALCIUM  --  8.8*   No results for input(s): LABPT, INR in the last 72 hours.  Neurologically intact  Assessment/Plan: 1 Day Post-Op Procedure(s) (LRB): L4-5 Decompression (N/A) Up with therapy discharge home today. Dressing change  Cedricka Sackrider C 04/14/2015, 7:24 AM

## 2015-04-14 NOTE — Progress Notes (Signed)
   04/14/15 1300  OT Visit Information  Last OT Received On 04/14/15  Assistance Needed +1  History of Present Illness 75 y.o. female admitted to [facility] on 04/13/15 for elective lumbar  (L4-5) decompression.  Pt with significant PMHx of CAD, HTN, anemia, DM, gout, diabetic neuropathy, CHF, PAD, tremor, MI s/p cardiac cath with stent, and R knee surgery.  OT G-codes **NOT FOR INPATIENT CLASS**  Functional Assessment Tool Used clinical judgement  Functional Limitation Self care  Self Care Current Status [phone]) CJ  Self Care Goal Status [id number]) [place]  additional entry for G-code status  Tyrone Schimke OTR/L Pager: [phone]

## 2015-04-15 DIAGNOSIS — E669 Obesity, unspecified: Secondary | ICD-10-CM | POA: Diagnosis not present

## 2015-04-15 DIAGNOSIS — E1151 Type 2 diabetes mellitus with diabetic peripheral angiopathy without gangrene: Secondary | ICD-10-CM | POA: Diagnosis not present

## 2015-04-15 DIAGNOSIS — E1142 Type 2 diabetes mellitus with diabetic polyneuropathy: Secondary | ICD-10-CM | POA: Diagnosis not present

## 2015-04-15 DIAGNOSIS — Z6836 Body mass index (BMI) 36.0-36.9, adult: Secondary | ICD-10-CM | POA: Diagnosis not present

## 2015-04-15 DIAGNOSIS — M1990 Unspecified osteoarthritis, unspecified site: Secondary | ICD-10-CM | POA: Diagnosis not present

## 2015-04-15 DIAGNOSIS — I2510 Atherosclerotic heart disease of native coronary artery without angina pectoris: Secondary | ICD-10-CM | POA: Diagnosis not present

## 2015-04-15 DIAGNOSIS — I110 Hypertensive heart disease with heart failure: Secondary | ICD-10-CM | POA: Diagnosis not present

## 2015-04-15 DIAGNOSIS — I509 Heart failure, unspecified: Secondary | ICD-10-CM | POA: Diagnosis not present

## 2015-04-15 DIAGNOSIS — M4806 Spinal stenosis, lumbar region: Secondary | ICD-10-CM | POA: Diagnosis not present

## 2015-04-15 LAB — CBC
HCT: 33.1 % — ABNORMAL LOW (ref 36.0–46.0)
HEMOGLOBIN: 11.1 g/dL — AB (ref 12.0–15.0)
MCH: 32.5 pg (ref 26.0–34.0)
MCHC: 33.5 g/dL (ref 30.0–36.0)
MCV: 96.8 fL (ref 78.0–100.0)
PLATELETS: 187 10*3/uL (ref 150–400)
RBC: 3.42 MIL/uL — ABNORMAL LOW (ref 3.87–5.11)
RDW: 15 % (ref 11.5–15.5)
WBC: 8.1 10*3/uL (ref 4.0–10.5)

## 2015-04-15 LAB — GLUCOSE, CAPILLARY
GLUCOSE-CAPILLARY: 154 mg/dL — AB (ref 65–99)
Glucose-Capillary: 209 mg/dL — ABNORMAL HIGH (ref 65–99)

## 2015-04-15 NOTE — Progress Notes (Signed)
Physical Therapy Treatment Patient Details Name: Teresa Peck MRN: [id number] DOB: [birth year] Today's Date: 04/15/2015    History of Present Illness 75 y.o. female admitted to [facility] on 04/13/15 for elective lumbar  (L4-5) decompression.  Pt with significant PMHx of CAD, HTN, anemia, DM, gout, diabetic neuropathy, CHF, PAD, tremor, MI s/p cardiac cath with stent, and R knee surgery.    PT Comments    Patient less confused this session but still different from baseline and easily distracted. Reviewed back precautions with pt able to recall 1/3 without assistance.   Follow Up Recommendations  Home health PT;Supervision/Assistance - 24 hour     Equipment Recommendations  Rolling walker with 5" wheels    Recommendations for Other Services       Precautions / Restrictions Precautions Precautions: Fall;Back Precaution Comments: pt is unsteady on her feet, even with RW    Mobility  Bed Mobility               General bed mobility comments: OOB in chair upon arrival  Transfers Overall transfer level: Needs assistance Equipment used: Rolling walker (2 wheeled) Transfers: Sit to/from Stand Sit to Stand: Min assist         General transfer comment: assist to power up into standing while maintaining back precautions; vc for hand placement and technique; pt able to follow one step commands this session  Ambulation/Gait Ambulation/Gait assistance: Min guard;Min assist Ambulation Distance (Feet): 100 Feet Assistive device: Rolling walker (2 wheeled) Gait Pattern/deviations: Step-through pattern;Decreased stride length;Trunk flexed Gait velocity: decreased   General Gait Details: vc for position of RW and assist to guide RW at times; vc for posture and to stay task focused; pt veers L and R while ambulating but with steady gait    Stairs            Wheelchair Mobility    Modified Rankin (Stroke Patients Only)       Balance Overall balance assessment: Needs  assistance Sitting-balance support: Bilateral upper extremity supported;Feet supported Sitting balance-Leahy Scale: Fair     Standing balance support: Bilateral upper extremity supported Standing balance-Leahy Scale: Poor                      Cognition Arousal/Alertness: Awake/alert Behavior During Therapy: WFL for tasks assessed/performed Overall Cognitive Status: Impaired/Different from baseline Area of Impairment: Attention;Safety/judgement;Awareness;Following commands;Orientation Orientation Level: Disoriented to;Time Current Attention Level: Focused Memory: Decreased short-term memory;Decreased recall of precautions   Safety/Judgement: Decreased awareness of safety;Decreased awareness of deficits Awareness: Intellectual   General Comments: pt still with confusion and wanting to read signs and  asking questions about differetn objects in room but improved cognitive status this am     Exercises      General Comments General comments (skin integrity, edema, etc.): pt oriented to situation; reviewed back precuations at beginning and end of session with pt able to recall 1/3 back precuations without assistance; pt continues to be easily distracted; refused further mobility with c/o bilat LE pain      Pertinent Vitals/Pain Pain Assessment: Faces Faces Pain Scale: Hurts even more Pain Location: back; bilat LE with mobility Pain Descriptors / Indicators: Grimacing;Sore;Sharp Pain Intervention(s): Limited activity within patient's tolerance;Monitored during session;Premedicated before session;Repositioned    Home Living                      Prior Function            PT Goals (current goals can  now be found in the care plan section) Acute Rehab PT Goals Patient Stated Goal: none stated PT Goal Formulation: With patient Time For Goal Achievement: 04/21/15 Potential to Achieve Goals: Good Progress towards PT goals: Progressing toward goals    Frequency   Min 5X/week    PT Plan Current plan remains appropriate    Co-evaluation             End of Session Equipment Utilized During Treatment: Gait belt Activity Tolerance: Other (comment) (limited by cognitive deficits) Patient left: in chair;with call bell/phone within reach;with chair alarm set     Time: 0822-0842 PT Time Calculation (min) (ACUTE ONLY): 20 min  Charges:  $Gait Training: 8-22 mins                    G Codes:  Functional Assessment Tool Used: assist level Functional Limitation: Mobility: Walking and moving around Mobility: Walking and Moving Around Current Status [phone]): At least 20 percent but less than 40 percent impaired, limited or restricted Mobility: Walking and Moving Around Goal Status [phone]): At least 1 percent but less than 20 percent impaired, limited or restricted   [place] April, PTA Pager: [phone]   04/15/2015, 9:45 AM

## 2015-04-15 NOTE — Discharge Instructions (Signed)
OK to shower. Ambulate daily. See Dr. Lorin Mercy in 1 week

## 2015-04-15 NOTE — Progress Notes (Signed)
Pt ready for discharge. Education/instructions reviewed with pt and pt's daughter, and all questions/concerns addressed. IV removed and belongings gathered. Pt will be transported out via wheelchair to daughter's car. Will continue to monitor

## 2015-04-15 NOTE — Progress Notes (Signed)
Pt continues to be disoriented throughout shift; is consistently oriented to self, but unable to answer other orientation questions appropriately. Pt remains high safety/fall risk due to disorientation and impulsive behaviors. Nursing staff continues to monitor closely and reorient pt. Bed alarm remains on, call bell within reach. Nursing will continue to monitor.

## 2015-04-15 NOTE — Progress Notes (Signed)
Subjective: 2 Days Post-Op Procedure(s) (LRB): L4-5 Decompression (N/A) Patient reports pain as mild.  I am ready to go home.   Objective: Vital signs in last 24 hours: Temp:  [97.9 F (36.6 C)-99.4 F (37.4 C)] 97.9 F (36.6 C) (02/24 0414) Pulse Rate:  [64-78] 78 (02/24 0414) Resp:  [16] 16 (02/24 0414) BP: (102-118)/(41-56) 118/56 mmHg (02/24 0414) SpO2:  [94 %-97 %] 97 % (02/24 0414)  Intake/Output from previous day: 02/23 0701 - 02/24 0700 In: 480 [P.O.:480] Out: 100 [Urine:100] Intake/Output this shift:     Recent Labs  04/13/15 1324 04/14/15 0500 04/15/15 0524  HGB 11.2* 11.7* 11.1*    Recent Labs  04/14/15 0500 04/15/15 0524  WBC 7.6 8.1  RBC 3.67* 3.42*  HCT 35.4* 33.1*  PLT 178 187    Recent Labs  04/13/15 1324 04/14/15 0500  NA 138 137  K 4.2 4.2  CL  --  106  CO2  --  21*  BUN  --  18  CREATININE  --  1.50*  GLUCOSE 211* 101*  CALCIUM  --  8.8*   No results for input(s): LABPT, INR in the last 72 hours.  Neurologically intact  Assessment/Plan: 2 Days Post-Op Procedure(s) (LRB): L4-5 Decompression (N/A) Up with therapy discharge home.  Discussed with pt and sister and family glyburide and  Renal function. She will review with LMD and correct. Ready for D/C home.    Bensen Chadderdon C 04/15/2015, 10:47 AM

## 2015-04-16 DIAGNOSIS — Z742 Need for assistance at home and no other household member able to render care: Secondary | ICD-10-CM | POA: Diagnosis not present

## 2015-04-16 DIAGNOSIS — Z7982 Long term (current) use of aspirin: Secondary | ICD-10-CM | POA: Diagnosis not present

## 2015-04-16 DIAGNOSIS — Z4789 Encounter for other orthopedic aftercare: Secondary | ICD-10-CM | POA: Diagnosis not present

## 2015-04-16 DIAGNOSIS — E1140 Type 2 diabetes mellitus with diabetic neuropathy, unspecified: Secondary | ICD-10-CM | POA: Diagnosis not present

## 2015-04-16 DIAGNOSIS — I252 Old myocardial infarction: Secondary | ICD-10-CM | POA: Diagnosis not present

## 2015-04-16 DIAGNOSIS — Z7984 Long term (current) use of oral hypoglycemic drugs: Secondary | ICD-10-CM | POA: Diagnosis not present

## 2015-04-16 DIAGNOSIS — Z7902 Long term (current) use of antithrombotics/antiplatelets: Secondary | ICD-10-CM | POA: Diagnosis not present

## 2015-04-16 DIAGNOSIS — E669 Obesity, unspecified: Secondary | ICD-10-CM | POA: Diagnosis not present

## 2015-04-18 ENCOUNTER — Telehealth: Payer: Self-pay | Admitting: Cardiovascular Disease

## 2015-04-18 NOTE — Telephone Encounter (Signed)
Spoke with patient's daughter, Caren Griffins, who called to discuss patient's medications.  She states several of the medications were held while the patient was in the hospital due to hypotension.  She states in hospital BP was 110/59 at discharge.  She is currently not taking carvedilol, ramipril, or spironolactone.  Caren Griffins states yesterday BP was 118/78, reports pulse approximately 60 bpm.  I advised her that I will discuss with Dr. Acie Fredrickson who is in the office today and call her back with his advice.  She verbalized understanding and agreement.

## 2015-04-18 NOTE — Telephone Encounter (Signed)
New Message--   Pt dtr calling to speak w/ Rn about starting medications since recent back surgery. Please call back and discuss.

## 2015-04-18 NOTE — Telephone Encounter (Signed)
The BP and HR are low . We will need to hold those meds for now. Have her continue to take BP and HR.

## 2015-04-18 NOTE — Telephone Encounter (Signed)
I left a detailed message for Teresa Peck on her voice mail to hold medications and continue to monitor vital signs.  I advised her to call the office with questions or concerns.

## 2015-04-19 DIAGNOSIS — E1140 Type 2 diabetes mellitus with diabetic neuropathy, unspecified: Secondary | ICD-10-CM | POA: Diagnosis not present

## 2015-04-19 DIAGNOSIS — Z7902 Long term (current) use of antithrombotics/antiplatelets: Secondary | ICD-10-CM | POA: Diagnosis not present

## 2015-04-19 DIAGNOSIS — Z7984 Long term (current) use of oral hypoglycemic drugs: Secondary | ICD-10-CM | POA: Diagnosis not present

## 2015-04-19 DIAGNOSIS — E669 Obesity, unspecified: Secondary | ICD-10-CM | POA: Diagnosis not present

## 2015-04-19 DIAGNOSIS — Z742 Need for assistance at home and no other household member able to render care: Secondary | ICD-10-CM | POA: Diagnosis not present

## 2015-04-19 DIAGNOSIS — Z4789 Encounter for other orthopedic aftercare: Secondary | ICD-10-CM | POA: Diagnosis not present

## 2015-04-19 DIAGNOSIS — Z7982 Long term (current) use of aspirin: Secondary | ICD-10-CM | POA: Diagnosis not present

## 2015-04-19 DIAGNOSIS — I252 Old myocardial infarction: Secondary | ICD-10-CM | POA: Diagnosis not present

## 2015-04-20 DIAGNOSIS — Z7982 Long term (current) use of aspirin: Secondary | ICD-10-CM | POA: Diagnosis not present

## 2015-04-20 DIAGNOSIS — Z4789 Encounter for other orthopedic aftercare: Secondary | ICD-10-CM | POA: Diagnosis not present

## 2015-04-20 DIAGNOSIS — I252 Old myocardial infarction: Secondary | ICD-10-CM | POA: Diagnosis not present

## 2015-04-20 DIAGNOSIS — E1140 Type 2 diabetes mellitus with diabetic neuropathy, unspecified: Secondary | ICD-10-CM | POA: Diagnosis not present

## 2015-04-20 DIAGNOSIS — Z7902 Long term (current) use of antithrombotics/antiplatelets: Secondary | ICD-10-CM | POA: Diagnosis not present

## 2015-04-20 DIAGNOSIS — Z742 Need for assistance at home and no other household member able to render care: Secondary | ICD-10-CM | POA: Diagnosis not present

## 2015-04-20 DIAGNOSIS — E669 Obesity, unspecified: Secondary | ICD-10-CM | POA: Diagnosis not present

## 2015-04-20 DIAGNOSIS — Z7984 Long term (current) use of oral hypoglycemic drugs: Secondary | ICD-10-CM | POA: Diagnosis not present

## 2015-04-25 DIAGNOSIS — Z7902 Long term (current) use of antithrombotics/antiplatelets: Secondary | ICD-10-CM | POA: Diagnosis not present

## 2015-04-25 DIAGNOSIS — Z7982 Long term (current) use of aspirin: Secondary | ICD-10-CM | POA: Diagnosis not present

## 2015-04-25 DIAGNOSIS — E669 Obesity, unspecified: Secondary | ICD-10-CM | POA: Diagnosis not present

## 2015-04-25 DIAGNOSIS — I252 Old myocardial infarction: Secondary | ICD-10-CM | POA: Diagnosis not present

## 2015-04-25 DIAGNOSIS — Z742 Need for assistance at home and no other household member able to render care: Secondary | ICD-10-CM | POA: Diagnosis not present

## 2015-04-25 DIAGNOSIS — Z7984 Long term (current) use of oral hypoglycemic drugs: Secondary | ICD-10-CM | POA: Diagnosis not present

## 2015-04-25 DIAGNOSIS — E1140 Type 2 diabetes mellitus with diabetic neuropathy, unspecified: Secondary | ICD-10-CM | POA: Diagnosis not present

## 2015-04-25 DIAGNOSIS — Z4789 Encounter for other orthopedic aftercare: Secondary | ICD-10-CM | POA: Diagnosis not present

## 2015-04-27 DIAGNOSIS — Z7902 Long term (current) use of antithrombotics/antiplatelets: Secondary | ICD-10-CM | POA: Diagnosis not present

## 2015-04-27 DIAGNOSIS — Z4789 Encounter for other orthopedic aftercare: Secondary | ICD-10-CM | POA: Diagnosis not present

## 2015-04-27 DIAGNOSIS — Z7982 Long term (current) use of aspirin: Secondary | ICD-10-CM | POA: Diagnosis not present

## 2015-04-27 DIAGNOSIS — Z742 Need for assistance at home and no other household member able to render care: Secondary | ICD-10-CM | POA: Diagnosis not present

## 2015-04-27 DIAGNOSIS — I252 Old myocardial infarction: Secondary | ICD-10-CM | POA: Diagnosis not present

## 2015-04-27 DIAGNOSIS — E1140 Type 2 diabetes mellitus with diabetic neuropathy, unspecified: Secondary | ICD-10-CM | POA: Diagnosis not present

## 2015-04-27 DIAGNOSIS — E669 Obesity, unspecified: Secondary | ICD-10-CM | POA: Diagnosis not present

## 2015-04-27 DIAGNOSIS — Z7984 Long term (current) use of oral hypoglycemic drugs: Secondary | ICD-10-CM | POA: Diagnosis not present

## 2015-05-01 NOTE — Discharge Summary (Signed)
Patient ID: Teresa Peck MRN: [id number] DOB/AGE: 10-20-1940 75 y.o.  Admit date: 04/13/2015 Discharge date: 05/01/2015  Admission Diagnoses:  Active Problems:   Lumbar stenosis with neurogenic claudication   Discharge Diagnoses:  Active Problems:   Lumbar stenosis with neurogenic claudication  status post Procedure(s): L4-5 Decompression  Past Medical History  Diagnosis Date  . Coronary artery disease     MI 2004-stent  . Hiatal hernia   . Hypertension   . Anemia   . Hyperlipidemia   . Diabetes mellitus   . Gout   . Diabetic neuropathy ([place])   . CAD (coronary artery disease)   . PAD (peripheral artery disease) ([place])   . CHF (congestive heart failure) ([place])   . Tremor   . Arthritis   . GERD (gastroesophageal reflux disease)     tums  . Cancer [facility])     skin cancer    Surgeries: Procedure(s): L4-5 Decompression on 04/13/2015   Consultants:    Discharged Condition: Improved  Hospital Course: Teresa Peck is an 75 y.o. female who was admitted 04/13/2015 for operative treatment of lumbar stenois. Patient failed conservative treatments (please see the history and physical for the specifics) and had severe unremitting pain that affects sleep, daily activities and work/hobbies. After pre-op clearance, the patient was taken to the operating room on 04/13/2015 and underwent  Procedure(s): L4-5 Decompression.    Patient was given perioperative antibiotics:  Anti-infectives    Start     Dose/Rate Route Frequency Ordered Stop   04/13/15 1200  ceFAZolin (ANCEF) IVPB 2 g/50 mL premix     2 g 100 mL/hr over 30 Minutes Intravenous To [facility] 04/12/15 1344 04/13/15 1300       Patient was given sequential compression devices and early ambulation to prevent DVT.   Patient benefited maximally from hospital stay and there were no complications. At the time of discharge, the patient was urinating/moving their bowels without difficulty, tolerating a regular  diet, pain is controlled with oral pain medications and they have been cleared by PT/OT.   Recent vital signs: No data found.    Recent laboratory studies: No results for input(s): WBC, HGB, HCT, PLT, NA, K, CL, CO2, BUN, CREATININE, GLUCOSE, INR, CALCIUM in the last 72 hours.  Invalid input(s): PT, 2   Discharge Medications:     Medication List    STOP taking these medications        propranolol ER 80 MG 24 hr capsule  Commonly known as:  INDERAL LA      TAKE these medications        aspirin 81 MG tablet  Take 81 mg by mouth daily.     carvedilol 3.125 MG tablet  Commonly known as:  COREG  Take 1 tablet (3.125 mg total) by mouth 2 (two) times daily.     clopidogrel 75 MG tablet  Commonly known as:  PLAVIX  Take 75 mg by mouth daily.     furosemide 40 MG tablet  Commonly known as:  LASIX  Take 40 mg by mouth daily.     gabapentin 600 MG tablet  Commonly known as:  NEURONTIN  Take 600 mg by mouth 2 (two) times daily.     glyBURIDE 5 MG tablet  Commonly known as:  DIABETA  Take 5 mg by mouth daily with breakfast.     HYDROcodone-acetaminophen 5-325 MG tablet  Commonly known as:  NORCO  Take 1-2 tablets by mouth every 4 (four) hours as needed  for moderate pain.     nitroGLYCERIN 0.4 MG SL tablet  Commonly known as:  NITROSTAT  Place 1 tablet (0.4 mg total) under the tongue every 5 (five) minutes as needed for chest pain.     quinapril 10 MG tablet  Commonly known as:  ACCUPRIL  Take 1 tablet (10 mg total) by mouth daily.     sitaGLIPtin 50 MG tablet  Commonly known as:  JANUVIA  Take 50 mg by mouth daily.     spironolactone 50 MG tablet  Commonly known as:  ALDACTONE  Take 25 mg by mouth daily.     ZANTAC PO  Take 1 tablet by mouth daily.        Diagnostic Studies: Dg Lumbar Spine 2-3 Views  04/13/2015  CLINICAL DATA:  Preop films L4-L5 decompression. EXAM: LUMBAR SPINE - 2-3 VIEW COMPARISON:  02/15/2015 FINDINGS: 3 portable lateral views of the  lumbar spine submitted. First view there is a posterior localization needle at the level of L3-L4 disc space. On the second view there is a posterior localization needle at the level of L4-L5 disc space just above spinous process of L5. The last view there is a posterior localization instrument at L5-S1 level just inferior aspect spinous process of L5. There is disc space flattening with vacuum disc phenomenon at L4-L5 and L5-S1 level. IMPRESSION: First view there is a posterior localization needle at the level of L3-L4 disc space. On the second view there is a posterior localization needle at the level of L4-L5 disc space just above spinous process of L5. The last view there is a posterior localization instrument at L5-S1 level just inferior aspect spinous process of L5. Electronically Signed   By: Lahoma Crocker M.D.   On: 04/13/2015 13:37          Follow-up Information    Follow up with [facility].   Why:  Someone from [facility] will contact you within 24-48hrs to arrange for start time for therapy.    Contact information:   [address] [place] [address] [phone]       Follow up with Marybelle Killings, MD In 1 week.   Specialty:  Orthopedic Surgery   Contact information:   [place] [address] [phone]       Discharge Plan:  discharge to home  Disposition:     Signed: Lanae Crumbly  [facility] orthopedics 05/01/2015, 6:35 PM

## 2015-05-02 DIAGNOSIS — E1165 Type 2 diabetes mellitus with hyperglycemia: Secondary | ICD-10-CM | POA: Diagnosis not present

## 2015-05-03 DIAGNOSIS — E1140 Type 2 diabetes mellitus with diabetic neuropathy, unspecified: Secondary | ICD-10-CM | POA: Diagnosis not present

## 2015-05-03 DIAGNOSIS — E669 Obesity, unspecified: Secondary | ICD-10-CM | POA: Diagnosis not present

## 2015-05-03 DIAGNOSIS — Z7984 Long term (current) use of oral hypoglycemic drugs: Secondary | ICD-10-CM | POA: Diagnosis not present

## 2015-05-03 DIAGNOSIS — Z7982 Long term (current) use of aspirin: Secondary | ICD-10-CM | POA: Diagnosis not present

## 2015-05-03 DIAGNOSIS — I252 Old myocardial infarction: Secondary | ICD-10-CM | POA: Diagnosis not present

## 2015-05-03 DIAGNOSIS — Z4789 Encounter for other orthopedic aftercare: Secondary | ICD-10-CM | POA: Diagnosis not present

## 2015-05-03 DIAGNOSIS — Z742 Need for assistance at home and no other household member able to render care: Secondary | ICD-10-CM | POA: Diagnosis not present

## 2015-05-03 DIAGNOSIS — Z7902 Long term (current) use of antithrombotics/antiplatelets: Secondary | ICD-10-CM | POA: Diagnosis not present

## 2015-05-05 DIAGNOSIS — Z742 Need for assistance at home and no other household member able to render care: Secondary | ICD-10-CM | POA: Diagnosis not present

## 2015-05-05 DIAGNOSIS — Z4789 Encounter for other orthopedic aftercare: Secondary | ICD-10-CM | POA: Diagnosis not present

## 2015-05-05 DIAGNOSIS — I252 Old myocardial infarction: Secondary | ICD-10-CM | POA: Diagnosis not present

## 2015-05-05 DIAGNOSIS — E1140 Type 2 diabetes mellitus with diabetic neuropathy, unspecified: Secondary | ICD-10-CM | POA: Diagnosis not present

## 2015-05-05 DIAGNOSIS — Z7982 Long term (current) use of aspirin: Secondary | ICD-10-CM | POA: Diagnosis not present

## 2015-05-05 DIAGNOSIS — Z7984 Long term (current) use of oral hypoglycemic drugs: Secondary | ICD-10-CM | POA: Diagnosis not present

## 2015-05-05 DIAGNOSIS — Z7902 Long term (current) use of antithrombotics/antiplatelets: Secondary | ICD-10-CM | POA: Diagnosis not present

## 2015-05-05 DIAGNOSIS — E669 Obesity, unspecified: Secondary | ICD-10-CM | POA: Diagnosis not present

## 2015-05-10 DIAGNOSIS — Z7984 Long term (current) use of oral hypoglycemic drugs: Secondary | ICD-10-CM | POA: Diagnosis not present

## 2015-05-10 DIAGNOSIS — E1140 Type 2 diabetes mellitus with diabetic neuropathy, unspecified: Secondary | ICD-10-CM | POA: Diagnosis not present

## 2015-05-10 DIAGNOSIS — E669 Obesity, unspecified: Secondary | ICD-10-CM | POA: Diagnosis not present

## 2015-05-10 DIAGNOSIS — Z7982 Long term (current) use of aspirin: Secondary | ICD-10-CM | POA: Diagnosis not present

## 2015-05-10 DIAGNOSIS — Z4789 Encounter for other orthopedic aftercare: Secondary | ICD-10-CM | POA: Diagnosis not present

## 2015-05-10 DIAGNOSIS — I252 Old myocardial infarction: Secondary | ICD-10-CM | POA: Diagnosis not present

## 2015-05-10 DIAGNOSIS — Z742 Need for assistance at home and no other household member able to render care: Secondary | ICD-10-CM | POA: Diagnosis not present

## 2015-05-10 DIAGNOSIS — Z7902 Long term (current) use of antithrombotics/antiplatelets: Secondary | ICD-10-CM | POA: Diagnosis not present

## 2015-05-12 DIAGNOSIS — Z7984 Long term (current) use of oral hypoglycemic drugs: Secondary | ICD-10-CM | POA: Diagnosis not present

## 2015-05-12 DIAGNOSIS — Z4789 Encounter for other orthopedic aftercare: Secondary | ICD-10-CM | POA: Diagnosis not present

## 2015-05-12 DIAGNOSIS — Z7902 Long term (current) use of antithrombotics/antiplatelets: Secondary | ICD-10-CM | POA: Diagnosis not present

## 2015-05-12 DIAGNOSIS — Z742 Need for assistance at home and no other household member able to render care: Secondary | ICD-10-CM | POA: Diagnosis not present

## 2015-05-12 DIAGNOSIS — E669 Obesity, unspecified: Secondary | ICD-10-CM | POA: Diagnosis not present

## 2015-05-12 DIAGNOSIS — E1140 Type 2 diabetes mellitus with diabetic neuropathy, unspecified: Secondary | ICD-10-CM | POA: Diagnosis not present

## 2015-05-12 DIAGNOSIS — I252 Old myocardial infarction: Secondary | ICD-10-CM | POA: Diagnosis not present

## 2015-05-12 DIAGNOSIS — Z7982 Long term (current) use of aspirin: Secondary | ICD-10-CM | POA: Diagnosis not present

## 2015-05-17 ENCOUNTER — Encounter: Payer: Self-pay | Admitting: Cardiovascular Disease

## 2015-05-17 ENCOUNTER — Ambulatory Visit (INDEPENDENT_AMBULATORY_CARE_PROVIDER_SITE_OTHER): Payer: Commercial Managed Care - HMO | Admitting: Cardiovascular Disease

## 2015-05-17 VITALS — BP 110/64 | HR 61 | Ht 64.0 in | Wt 213.4 lb

## 2015-05-17 DIAGNOSIS — I2510 Atherosclerotic heart disease of native coronary artery without angina pectoris: Secondary | ICD-10-CM

## 2015-05-17 DIAGNOSIS — I5022 Chronic systolic (congestive) heart failure: Secondary | ICD-10-CM | POA: Diagnosis not present

## 2015-05-17 NOTE — Progress Notes (Signed)
Cardiology Office Note   Date:  05/17/2015   ID:  Teresa Peck, DOB [birth year], MRN [id number]  PCP:  Horatio Pel, MD  Cardiologist:  = Nahser, Wonda Cheng, MD   Chief Complaint  Patient presents with  . Coronary Artery Disease    NO CHEST PAIN OR SOB JUST A LITTLE FEET SWELLING   Problem list: 1. Coronary artery disease: She has an occluded LAD. She status post stenting of her first diagonal 2. Hyperlipidemia 3. Chronic systolic congestive heart failure 4. Essential hypertension 5. 5. Diabetes mellitus - peripheral neuropathy     History of Present Illness: Teresa Peck is a 75 y.o. female who presents for follow-up of her coronary artery disease. She's been seen in the past by Dr. Rockey Situ. She was seen with her Daughter, [name].  She had a cath in 2004 and had stenting of her 1st diag. Dr. Minna Antis has stopped some of her meds ( Coreg) also stopped allopurinol and Digoxin.   She denies any cp.  Breathing is ok.   Eats lots of Progresso soup.  She does not exercise.   Knows that she needs to walk more.  Has claudication with ambulation .   Aug. 30, 2016:   Doing well.  Complains of leg pain - was told that she had diabetic neuropathy Lots of leg swelling ,  Still eating lots of soup and canned foods.   Dec. 2 , 2016:  Breathing is good.  Leg Swelling is better  She thinks the Actos is causing fluid retention and weight gain .   May 17, 2015:  Has had her back surgery  ,.   Legs and back feel much better.  She was hypotensive prior to her back surgery and we held her coreg and Quinipril    Past Medical History  Diagnosis Date  . Coronary artery disease     MI 2004-stent  . Hiatal hernia   . Hypertension   . Anemia   . Hyperlipidemia   . Diabetes mellitus   . Gout   . Diabetic neuropathy ([place])   . CAD (coronary artery disease)   . PAD (peripheral artery disease) ([place])   . CHF (congestive heart failure) ([place])   . Tremor   .  Arthritis   . GERD (gastroesophageal reflux disease)     tums  . Cancer [facility])     skin cancer    Past Surgical History  Procedure Laterality Date  . Cardiac catheterization      stent post MI   . Knee surgery  1974    right  . Cholecystectomy    . Bilateral cornea implants  2010  . Eye surgery    . Coronary angioplasty      2004  stent  . Lumbar laminectomy/decompression microdiscectomy N/A 04/13/2015    Procedure: L4-5 Decompression;  Surgeon: Marybelle Killings, MD;  Location: [facility];  Service: Orthopedics;  Laterality: N/A;  Need RNFA     Current Outpatient Prescriptions  Medication Sig Dispense Refill  . clopidogrel (PLAVIX) 75 MG tablet Take 75 mg by mouth daily.      . furosemide (LASIX) 40 MG tablet Take 40 mg by mouth daily.    Marland Kitchen gabapentin (NEURONTIN) 600 MG tablet Take 600 mg by mouth 2 (two) times daily.      Marland Kitchen glyBURIDE (DIABETA) 5 MG tablet Take 5 mg by mouth daily with breakfast.     . HYDROcodone-acetaminophen (NORCO) 5-325 MG tablet Take 1-2 tablets  by mouth every 4 (four) hours as needed for moderate pain. 60 tablet 0  . nitroGLYCERIN (NITROSTAT) 0.4 MG SL tablet Place 1 tablet (0.4 mg total) under the tongue every 5 (five) minutes as needed for chest pain. 25 tablet 6  . RaNITidine HCl (ZANTAC PO) Take 1 tablet by mouth daily.    . sitaGLIPtin (JANUVIA) 50 MG tablet Take 50 mg by mouth daily.    Marland Kitchen spironolactone (ALDACTONE) 50 MG tablet Take 25 mg by mouth daily.    Marland Kitchen aspirin 81 MG tablet Take 81 mg by mouth daily. Reported on 05/17/2015    . carvedilol (COREG) 3.125 MG tablet Take 1 tablet (3.125 mg total) by mouth 2 (two) times daily. (Patient not taking: Reported on 04/13/2015) 60 tablet 11  . quinapril (ACCUPRIL) 10 MG tablet Take 1 tablet (10 mg total) by mouth daily. (Patient not taking: Reported on 04/13/2015) 31 tablet 11   No current facility-administered medications for this visit.    Allergies:   Sulfur    Social History:  The patient  reports that she  quit smoking about 37 years ago. Her smoking use included Cigarettes. She has a 2.5 pack-year smoking history. She has never used smokeless tobacco. She reports that she does not drink alcohol or use illicit drugs.   Family History:  The patient's family history includes CAD in her mother; Cancer in her mother; Heart attack in her brother, father, and sister; Heart disease in her mother; Stroke in her mother.    ROS:  Please see the history of present illness.    Review of Systems: Constitutional:  denies fever, chills, diaphoresis, appetite change and fatigue.  HEENT: denies photophobia, eye pain, redness, hearing loss, ear pain, congestion, sore throat, rhinorrhea, sneezing, neck pain, neck stiffness and tinnitus.  Respiratory: denies SOB, DOE, cough, chest tightness, and wheezing.  Cardiovascular: denies chest pain, palpitations and leg swelling.  Gastrointestinal: denies nausea, vomiting, abdominal pain, diarrhea, constipation, blood in stool.  Genitourinary: denies dysuria, urgency, frequency, hematuria, flank pain and difficulty urinating.  Musculoskeletal: admits to  claudication in both legs with walking.   Skin: denies pallor, rash and wound.  Neurological: denies dizziness, seizures, syncope, weakness, light-headedness, numbness and headaches.   Hematological: denies adenopathy, easy bruising, personal or family bleeding history.  Psychiatric/ Behavioral: denies suicidal ideation, mood changes, confusion, nervousness, sleep disturbance and agitation.       All other systems are reviewed and negative.    PHYSICAL EXAM: VS:  BP 110/64 mmHg  Pulse 61  Ht 5\' 4"  (1.626 m)  Wt 213 lb 6.4 oz (96.798 kg)  BMI 36.61 kg/m2 , BMI Body mass index is 36.61 kg/(m^2). GEN: Well nourished, well developed, in no acute distress HEENT: normal Neck: no JVD, carotid bruits, or masses Cardiac: RRR; no murmurs, rubs, or gallops,no edema  Respiratory:  clear to auscultation bilaterally, normal  work of breathing GI: soft, nontender, nondistended, + BS MS: no deformity or atrophy Skin: warm and dry, no rash Neuro:  Strength and sensation are intact Psych: normal   EKG:  EKG is ordered today. The ekg ordered today demonstrates NSR at 61.  Old septal MI    Recent Labs: 04/08/2015: ALT 10* 04/14/2015: BUN 18; Creatinine, Ser 1.50*; Potassium 4.2; Sodium 137 04/15/2015: Hemoglobin 11.1*; Platelets 187    Lipid Panel No results found for: CHOL, TRIG, HDL, CHOLHDL, VLDL, LDLCALC, LDLDIRECT    Wt Readings from Last 3 Encounters:  05/17/15 213 lb 6.4 oz (96.798 kg)  04/13/15  211 lb (95.709 kg)  04/08/15 211 lb 12.8 oz (96.072 kg)     Other studies Reviewed: Additional studies/ records that were reviewed today include: . Review of the above records demonstrates:    ASSESSMENT AND PLAN:  1. Coronary artery disease: She has an occluded LAD. She status post stenting of her first diagonal.  She's not having any recent episodes of angina. Continue same medications.  2. Hyperlipidemia - followed by her general medical doctor.  3. Chronic systolic congestive heart failure - she has an occluded LAD and has an ischemic cardiopathy.   spironolactone 25 mg a day has been added to her medical regimen .   She was on Quinipril and Coreg but these were DC'd  Due to hypotension      4. Essential hypertension - blood pressure is well-controlled.  5. 5. Diabetes mellitus - she likely has a peripheral neuropathy   6. Leg edema :   Encouraged leg elevation and compression hose. Marland Kitchen Avoid salt , avoid canned foods.   Current medicines are reviewed at length with the patient today.  The patient does not have concerns regarding medicines.  The following changes have been made:  no change  Disposition:   FU with me in 6 months.     Nahser, Wonda Cheng, MD  05/17/2015 4:28 PM    [facility] [place], [place], [place]  [address] Phone: [phone]; Fax: [phone]

## 2015-05-17 NOTE — Patient Instructions (Addendum)
Medication Instructions:  STOP Quinapril STOP Carvedilol (Coreg)    Labwork: None Ordered   Testing/Procedures: Your physician recommends that you wear compression stockings for your leg edema.    Follow-Up: Your physician wants you to follow-up in: 6 months with Dr. Acie Fredrickson.  You will receive a reminder letter in the mail two months in advance. If you don't receive a letter, please call our office to schedule the follow-up appointment.    For your  leg edema you  should do  the following 1. Leg elevation - I recommend the Lounge Dr. Leg rest.  See below for details  2. Salt restriction  -  Use potassium chloride instead of regular salt as a salt substitute. 3. Walk regularly 4. Compression hose - [facility] 5. Weight loss     Go to [email or web address]        If you need a refill on your cardiac medications before your next appointment, please call your pharmacy.   Thank you for choosing [facility]! Christen Bame, RN [phone]

## 2015-06-06 DIAGNOSIS — E119 Type 2 diabetes mellitus without complications: Secondary | ICD-10-CM | POA: Diagnosis not present

## 2015-07-08 DIAGNOSIS — M1712 Unilateral primary osteoarthritis, left knee: Secondary | ICD-10-CM | POA: Diagnosis not present

## 2015-08-09 DIAGNOSIS — M1712 Unilateral primary osteoarthritis, left knee: Secondary | ICD-10-CM | POA: Diagnosis not present

## 2015-08-09 DIAGNOSIS — M4806 Spinal stenosis, lumbar region: Secondary | ICD-10-CM | POA: Diagnosis not present

## 2015-08-09 DIAGNOSIS — M545 Low back pain: Secondary | ICD-10-CM | POA: Diagnosis not present

## 2015-09-20 DIAGNOSIS — E1165 Type 2 diabetes mellitus with hyperglycemia: Secondary | ICD-10-CM | POA: Diagnosis not present

## 2015-09-20 DIAGNOSIS — I10 Essential (primary) hypertension: Secondary | ICD-10-CM | POA: Diagnosis not present

## 2015-09-20 DIAGNOSIS — Z794 Long term (current) use of insulin: Secondary | ICD-10-CM | POA: Diagnosis not present

## 2015-09-20 DIAGNOSIS — I2510 Atherosclerotic heart disease of native coronary artery without angina pectoris: Secondary | ICD-10-CM | POA: Diagnosis not present

## 2015-09-20 DIAGNOSIS — Z0000 Encounter for general adult medical examination without abnormal findings: Secondary | ICD-10-CM | POA: Diagnosis not present

## 2015-09-27 DIAGNOSIS — E1165 Type 2 diabetes mellitus with hyperglycemia: Secondary | ICD-10-CM | POA: Diagnosis not present

## 2015-09-27 DIAGNOSIS — N390 Urinary tract infection, site not specified: Secondary | ICD-10-CM | POA: Diagnosis not present

## 2015-10-25 ENCOUNTER — Encounter: Payer: Self-pay | Admitting: Cardiovascular Disease

## 2015-11-07 ENCOUNTER — Other Ambulatory Visit: Payer: Self-pay | Admitting: Pharmacist

## 2015-11-07 NOTE — Patient Outreach (Signed)
Outreach call to [employer] regarding her request for follow up from the [facility] Medication Adherence Campaign. Called and spoke with patient. HIPAA identifiers verified and verbal consent received.  Ms. Thacher reports that she sees Caren Hassell Done, pharmacist with [facility], who manages her medications for diabetes. Reports that she currently takes metformin 500 mg twice daily as directed. Reports that she also takes Lantus. Reports that she currently takes 4-5 units under the skin twice daily. Reports that this dose is lower than she was directed to take by Caren. However, reports that she adjusted the dose down herself based on her blood sugar. Ms. Stradling reports the following morning fasting blood sugars from the past few days: 131 (today), 137, 115, 114, 141 mg/dL. Reports that she checks her blood sugar in the evening as well, but has not been recording these because the numbers have been high, around 200s. Reports that her blood sugar was 185 mg/dL last night before bed.  Counsel patient on the importance of continuing to check her blood sugar as directed and of recording all readings in her log. Patient verbalizes understanding. Counsel patient about the importance of communicating with her provider about making any changes to her medications. Patient reports that her next appointment with Caren is scheduled for 12/06/15. Advised patient to go ahead and call the office to let Caren know about the change that she made to her Lantus regimen and to report her recent blood sugars. Ms. Haston states that she will do this.  Ms. Jen reports that she has no further medication questions or concerns for me today. Provide patient with my phone number.  Harlow Asa, PharmD Clinical Pharmacist [facility] [phone]

## 2015-11-08 ENCOUNTER — Encounter: Payer: Self-pay | Admitting: Cardiovascular Disease

## 2015-11-08 ENCOUNTER — Ambulatory Visit (INDEPENDENT_AMBULATORY_CARE_PROVIDER_SITE_OTHER): Payer: Commercial Managed Care - HMO | Admitting: Cardiovascular Disease

## 2015-11-08 VITALS — BP 114/70 | HR 70 | Ht 64.0 in | Wt 205.4 lb

## 2015-11-08 DIAGNOSIS — I4891 Unspecified atrial fibrillation: Secondary | ICD-10-CM | POA: Diagnosis not present

## 2015-11-08 DIAGNOSIS — I481 Persistent atrial fibrillation: Secondary | ICD-10-CM | POA: Diagnosis not present

## 2015-11-08 DIAGNOSIS — I4819 Other persistent atrial fibrillation: Secondary | ICD-10-CM

## 2015-11-08 DIAGNOSIS — I2510 Atherosclerotic heart disease of native coronary artery without angina pectoris: Secondary | ICD-10-CM | POA: Diagnosis not present

## 2015-11-08 MED ORDER — METOPROLOL TARTRATE 25 MG PO TABS: 25.0000 mg | ORAL_TABLET | Freq: Two times a day (BID) | ORAL | 11 refills | Status: DC

## 2015-11-08 MED ORDER — NITROGLYCERIN 0.4 MG SL SUBL: 0.4000 mg | SUBLINGUAL_TABLET | SUBLINGUAL | 6 refills | Status: DC | PRN

## 2015-11-08 MED ORDER — APIXABAN 2.5 MG PO TABS: 2.5000 mg | ORAL_TABLET | Freq: Two times a day (BID) | ORAL | 11 refills | Status: DC

## 2015-11-08 NOTE — Patient Instructions (Addendum)
Medication Instructions:  STOP Plavix (clopidogrel) START Eliquis (apixaban) 5 mg twice daily START Metoprolol tartrate (Lopressor) 25 mg twice daily   Labwork: TODAY - TSH, complete metabolic panel, cholesterol  Your physician recommends that you return for lab work in: 1 month for CBC, basic metabolic panel   Testing/Procedures: Your physician has requested that you have an echocardiogram. Echocardiography is a painless test that uses sound waves to create images of your heart. It provides your doctor with information about the size and shape of your heart and how well your heart's chambers and valves are working. This procedure takes approximately one hour. There are no restrictions for this procedure.   Follow-Up: Your physician recommends that you schedule a follow-up appointment in: 3 months with Dr. Acie Fredrickson   If you need a refill on your cardiac medications before your next appointment, please call your pharmacy.   Thank you for choosing [facility]! Christen Bame, RN [phone]

## 2015-11-08 NOTE — Progress Notes (Signed)
Cardiology Office Note   Date:  11/08/2015   ID:  [name], DOB 1940-07-29, MRN [id number]  PCP:  Horatio Pel, MD  Cardiologist:   Mertie Moores, MD   No chief complaint on file.  Problem list: 1. Coronary artery disease: She has an occluded LAD. She status post stenting of her first diagonal 2. Hyperlipidemia 3. Chronic systolic congestive heart failure 4. Essential hypertension 5. 5. Diabetes mellitus - peripheral neuropathy     History of Present Illness: Teresa Peck is a 75 y.o. female who presents for follow-up of her coronary artery disease. She's been seen in the past by Dr. Rockey Situ. She was seen with her Daughter, [name].  She had a cath in 2004 and had stenting of her 1st diag. Dr. Minna Antis has stopped some of her meds ( Coreg) also stopped allopurinol and Digoxin.   She denies any cp.  Breathing is ok.   Eats lots of Progresso soup.  She does not exercise.   Knows that she needs to walk more.  Has claudication with ambulation .   Aug. 30, 2016:   Doing well.  Complains of leg pain - was told that she had diabetic neuropathy Lots of leg swelling ,  Still eating lots of soup and canned foods.   Dec. 2 , 2016:  Breathing is good.  Leg Swelling is better  She thinks the Actos is causing fluid retention and weight gain .   May 17, 2015:  Has had her back surgery  ,.   Legs and back feel much better.  She was hypotensive prior to her back surgery and we held her coreg and Quinipril   Sept. 19, 2017:  [name] is seen back for a follow up visit. Has cut out her salt. Has lots 8 lbs.  Breathing is ok   Cannot tell that her HR is irregular.    Past Medical History:  Diagnosis Date  . Anemia   . Arthritis   . CAD (coronary artery disease)   . Cancer ([place])    skin cancer  . CHF (congestive heart failure) ([place])   . Coronary artery disease    MI 2004-stent  . Diabetes mellitus   . Diabetic neuropathy ([place] Peck)   . GERD  (gastroesophageal reflux disease)    tums  . Gout   . Hiatal hernia   . Hyperlipidemia   . Hypertension   . PAD (peripheral artery disease) ([place])   . Tremor     Past Surgical History:  Procedure Laterality Date  . BILATERAL CORNEA IMPLANTS  2010  . CARDIAC CATHETERIZATION     stent post MI   . CHOLECYSTECTOMY    . CORONARY ANGIOPLASTY     2004  stent  . EYE SURGERY    . Alligator   right  . LUMBAR LAMINECTOMY/DECOMPRESSION MICRODISCECTOMY N/A 04/13/2015   Procedure: L4-5 Decompression;  Surgeon: Marybelle Killings, MD;  Location: [facility];  Service: Orthopedics;  Laterality: N/A;  Need RNFA     Current Outpatient Prescriptions  Medication Sig Dispense Refill  . allopurinol (ZYLOPRIM) 300 MG tablet Take 300 mg by mouth daily.    Marland Kitchen aspirin 81 MG tablet Take 81 mg by mouth daily. Reported on 05/17/2015    . atorvastatin (LIPITOR) 20 MG tablet Take 20 mg by mouth daily.    . clopidogrel (PLAVIX) 75 MG tablet Take 75 mg by mouth daily.      . furosemide (LASIX) 40 MG tablet  Take 40 mg by mouth daily.    Marland Kitchen gabapentin (NEURONTIN) 600 MG tablet Take 600 mg by mouth 2 (two) times daily.      . insulin glargine (LANTUS) 100 unit/mL SOPN Inject 5 Units into the skin 2 (two) times daily.    . metFORMIN (GLUCOPHAGE) 500 MG tablet Take 500 mg by mouth 2 (two) times daily with a meal.    . nitroGLYCERIN (NITROSTAT) 0.4 MG SL tablet Place 1 tablet (0.4 mg total) under the tongue every 5 (five) minutes as needed for chest pain. 25 tablet 6  . propranolol ER (INDERAL LA) 80 MG 24 hr capsule Take 80 mg by mouth daily.    . RaNITidine HCl (ZANTAC PO) Take 1 tablet by mouth daily.     No current facility-administered medications for this visit.     Allergies:   Sulfur    Social History:  The patient  reports that she quit smoking about 37 years ago. Her smoking use included Cigarettes. She has a 2.50 pack-year smoking history. She has never used smokeless tobacco. She reports that she does not  drink alcohol or use drugs.   Family History:  The patient's family history includes CAD in her mother; Cancer in her mother; Heart attack in her brother, father, and sister; Heart disease in her mother; Stroke in her mother.    ROS:  Please see the history of present illness.    Review of Systems: Constitutional:  denies fever, chills, diaphoresis, appetite change and fatigue.  HEENT: denies photophobia, eye pain, redness, hearing loss, ear pain, congestion, sore throat, rhinorrhea, sneezing, neck pain, neck stiffness and tinnitus.  Respiratory: denies SOB, DOE, cough, chest tightness, and wheezing.  Cardiovascular: denies chest pain, palpitations and leg swelling.  Gastrointestinal: denies nausea, vomiting, abdominal pain, diarrhea, constipation, blood in stool.  Genitourinary: denies dysuria, urgency, frequency, hematuria, flank pain and difficulty urinating.  Musculoskeletal: admits to  claudication in both legs with walking.   Skin: denies pallor, rash and wound.  Neurological: denies dizziness, seizures, syncope, weakness, light-headedness, numbness and headaches.   Hematological: denies adenopathy, easy bruising, personal or family bleeding history.  Psychiatric/ Behavioral: denies suicidal ideation, mood changes, confusion, nervousness, sleep disturbance and agitation.       All other systems are reviewed and negative.    PHYSICAL EXAM: VS:  BP 114/70 (BP Location: Right Arm, Patient Position: Sitting, Cuff Size: Large)   Pulse 70   Ht 5\' 4"  (1.626 m)   Wt 205 lb 6.4 oz (93.2 kg)   BMI 35.26 kg/m  , BMI Body mass index is 35.26 kg/m. GEN: Well nourished, well developed, in no acute distress  HEENT: normal  Neck: no JVD, carotid bruits, or masses Cardiac: irreg. Irreg. ; no murmurs, rubs, or gallops,no edema  Respiratory:  clear to auscultation bilaterally, normal work of breathing GI: soft, nontender, nondistended, + BS MS: no deformity or atrophy  Skin: warm and  dry, no rash Neuro:  Strength and sensation are intact Psych: normal  EKG:  EKG is ordered today. Sept. 19, 2017.   Atrial fib with HR of 96.   The atrial fib is new since her previous ECG   Recent Labs: 04/08/2015: ALT 10 04/14/2015: BUN 18; Creatinine, Ser 1.50; Potassium 4.2; Sodium 137 04/15/2015: Hemoglobin 11.1; Platelets 187    Lipid Panel No results found for: CHOL, TRIG, HDL, CHOLHDL, VLDL, LDLCALC, LDLDIRECT    Wt Readings from Last 3 Encounters:  11/08/15 205 lb 6.4 oz (93.2 kg)  05/17/15 213 lb 6.4 oz (96.8 kg)  04/13/15 211 lb (95.7 kg)     Other studies Reviewed: Additional studies/ records that were reviewed today include: . Review of the above records demonstrates:    ASSESSMENT AND PLAN:  1. Coronary artery disease: She has an occluded LAD. She status post stenting of her first diagonal.  She's not having any recent episodes of angina.    Will DC plavix since we are starting Eliquis  2. Atrial Fibrillation :   Has newly diagnosed A-Fib. She is completely asymptomatic .    Had the sensation that her HR was faster several days ago but cannot tell that she has any HR irregularity at this point    [place] is  26  ( female, age 48, DM,  HTN)  Will start Eliquis 5 mg BID HR is slighly fast.   Will add metoprolol 25 BID Will get an echo  DC plavix   3. Chronic systolic congestive heart failure - she has an occluded LAD and has an ischemic cardiopathy.      4. Essential hypertension - blood pressure is well-controlled.  5. 5. Diabetes mellitus - she likely has a peripheral neuropathy   6. Leg edema :   Encouraged leg elevation and compression hose. Marland Kitchen Avoid salt , avoid canned foods.   Current medicines are reviewed at length with the patient today.  The patient does not have concerns regarding medicines.     Disposition:   FU with me in  3  months.     Mertie Moores, MD  11/08/2015 4:29 PM    [address] [place],  [place],   [address] Phone: [phone]; Fax: [phone]

## 2015-11-10 ENCOUNTER — Other Ambulatory Visit: Payer: Commercial Managed Care - HMO | Admitting: *Deleted

## 2015-11-10 DIAGNOSIS — I2510 Atherosclerotic heart disease of native coronary artery without angina pectoris: Secondary | ICD-10-CM

## 2015-11-10 DIAGNOSIS — E785 Hyperlipidemia, unspecified: Secondary | ICD-10-CM

## 2015-11-10 DIAGNOSIS — I2583 Coronary atherosclerosis due to lipid rich plaque: Secondary | ICD-10-CM

## 2015-11-10 LAB — COMPREHENSIVE METABOLIC PANEL
ALBUMIN: 3.7 g/dL (ref 3.6–5.1)
ALK PHOS: 62 U/L (ref 33–130)
ALT: 14 U/L (ref 6–29)
AST: 19 U/L (ref 10–35)
BILIRUBIN TOTAL: 0.4 mg/dL (ref 0.2–1.2)
BUN: 24 mg/dL (ref 7–25)
CALCIUM: 8.8 mg/dL (ref 8.6–10.4)
CO2: 24 mmol/L (ref 20–31)
CREATININE: 1.22 mg/dL — AB (ref 0.60–0.93)
Chloride: 104 mmol/L (ref 98–110)
Glucose, Bld: 183 mg/dL — ABNORMAL HIGH (ref 65–99)
POTASSIUM: 4 mmol/L (ref 3.5–5.3)
Sodium: 137 mmol/L (ref 135–146)
TOTAL PROTEIN: 6.1 g/dL (ref 6.1–8.1)

## 2015-11-10 LAB — CBC WITH DIFFERENTIAL/PLATELET
BASOS ABS: 0 {cells}/uL (ref 0–200)
Basophils Relative: 0 %
EOS ABS: 408 {cells}/uL (ref 15–500)
Eosinophils Relative: 4 %
HCT: 38.7 % (ref 35.0–45.0)
Hemoglobin: 12.9 g/dL (ref 11.7–15.5)
Lymphocytes Relative: 23 %
Lymphs Abs: 2346 cells/uL (ref 850–3900)
MCH: 31.5 pg (ref 27.0–33.0)
MCHC: 33.3 g/dL (ref 32.0–36.0)
MCV: 94.6 fL (ref 80.0–100.0)
MONOS PCT: 5 %
MPV: 10.3 fL (ref 7.5–12.5)
Monocytes Absolute: 510 cells/uL (ref 200–950)
NEUTROS ABS: 6936 {cells}/uL (ref 1500–7800)
NEUTROS PCT: 68 %
PLATELETS: 198 10*3/uL (ref 140–400)
RBC: 4.09 MIL/uL (ref 3.80–5.10)
RDW: 15.5 % — ABNORMAL HIGH (ref 11.0–15.0)
WBC: 10.2 10*3/uL (ref 3.8–10.8)

## 2015-11-10 LAB — TSH: TSH: 3.51 mIU/L

## 2015-11-14 ENCOUNTER — Telehealth: Payer: Self-pay | Admitting: Cardiovascular Disease

## 2015-11-14 NOTE — Telephone Encounter (Signed)
Calling wanting to know if Dr. Acie Fredrickson checked her Iron.  Advised that he did check her CBC and Hgb had increased.  States she still feels tired all the time.  Advised for her to contact Dr. Shelia Media.  Sent copy of labs to him. She will call them.

## 2015-11-14 NOTE — Telephone Encounter (Signed)
°  New Prob   Pt has questions regarding most recent blood work results. Please call.

## 2015-11-15 NOTE — Addendum Note (Signed)
Addended by: Emmaline Life on: 11/15/2015 09:49 AM   Modules accepted: Orders

## 2015-12-06 DIAGNOSIS — E119 Type 2 diabetes mellitus without complications: Secondary | ICD-10-CM | POA: Diagnosis not present

## 2015-12-06 DIAGNOSIS — E1165 Type 2 diabetes mellitus with hyperglycemia: Secondary | ICD-10-CM | POA: Diagnosis not present

## 2015-12-06 DIAGNOSIS — G250 Essential tremor: Secondary | ICD-10-CM | POA: Diagnosis not present

## 2015-12-08 ENCOUNTER — Ambulatory Visit (HOSPITAL_COMMUNITY): Payer: Commercial Managed Care - HMO | Attending: Cardiovascular Disease

## 2015-12-08 ENCOUNTER — Other Ambulatory Visit: Payer: Self-pay

## 2015-12-08 ENCOUNTER — Other Ambulatory Visit: Payer: Commercial Managed Care - HMO | Admitting: *Deleted

## 2015-12-08 DIAGNOSIS — I4891 Unspecified atrial fibrillation: Secondary | ICD-10-CM | POA: Insufficient documentation

## 2015-12-08 DIAGNOSIS — I2510 Atherosclerotic heart disease of native coronary artery without angina pectoris: Secondary | ICD-10-CM

## 2015-12-08 LAB — CBC
HCT: 40.8 % (ref 35.0–45.0)
Hemoglobin: 13.8 g/dL (ref 11.7–15.5)
MCH: 31.9 pg (ref 27.0–33.0)
MCHC: 33.8 g/dL (ref 32.0–36.0)
MCV: 94.2 fL (ref 80.0–100.0)
MPV: 10.4 fL (ref 7.5–12.5)
PLATELETS: 226 10*3/uL (ref 140–400)
RBC: 4.33 MIL/uL (ref 3.80–5.10)
RDW: 14.5 % (ref 11.0–15.0)
WBC: 10.4 10*3/uL (ref 3.8–10.8)

## 2015-12-08 LAB — BASIC METABOLIC PANEL
BUN: 26 mg/dL — ABNORMAL HIGH (ref 7–25)
CALCIUM: 9.6 mg/dL (ref 8.6–10.4)
CO2: 25 mmol/L (ref 20–31)
CREATININE: 1.47 mg/dL — AB (ref 0.60–0.93)
Chloride: 102 mmol/L (ref 98–110)
Glucose, Bld: 185 mg/dL — ABNORMAL HIGH (ref 65–99)
Potassium: 4.8 mmol/L (ref 3.5–5.3)
Sodium: 139 mmol/L (ref 135–146)

## 2015-12-09 ENCOUNTER — Other Ambulatory Visit: Payer: Self-pay | Admitting: Nurse Practitioner

## 2015-12-12 ENCOUNTER — Telehealth: Payer: Self-pay | Admitting: Cardiovascular Disease

## 2015-12-12 NOTE — Telephone Encounter (Signed)
Spoke with patient who called back to clarify that she does not need to take propranolol 80 mg because this instruction is not listed on her last discharge summary.  I advised her that per Dr. Acie Fredrickson, she should d/c propranolol and continue metoprolol.  She verbalized understanding and agreement and I thanked her for double-checking for accuracy.

## 2015-12-12 NOTE — Telephone Encounter (Signed)
New Message  Pt call requesting to speak with RN about a medication she takes she is taking but its not on her med list. Pt did not know the correct name or spelling of medication. Please call back to discuss

## 2016-02-23 ENCOUNTER — Ambulatory Visit: Payer: Commercial Managed Care - HMO | Admitting: Cardiovascular Disease

## 2016-03-19 ENCOUNTER — Other Ambulatory Visit: Payer: Self-pay | Admitting: *Deleted

## 2016-03-19 MED ORDER — APIXABAN 5 MG PO TABS: 5.0000 mg | ORAL_TABLET | Freq: Two times a day (BID) | ORAL | 5 refills | Status: DC

## 2016-03-19 MED ORDER — METOPROLOL TARTRATE 25 MG PO TABS: 25.0000 mg | ORAL_TABLET | Freq: Two times a day (BID) | ORAL | 2 refills | Status: DC

## 2016-03-19 NOTE — Telephone Encounter (Signed)
Pt has requested a refill on Eliquis 2.5mg  twice a day through her pharmacy; after assessing dosing criteria the pt should be on Eliquis 5mg  daily.  The pt is 76 years old, Creatinine 1.47 on 12/08/15, Weight-205.4lbs=93.4kg; spoke with Dr. Acie Fredrickson regarding this & determined that the pt should be on Eliquis 5mg  twice a day.  Called the pt & informed her of the dosing change and instructed her to take two Eliquis 2.5mg  tablets twice a day until her Eliquis 5 mg arrives in the mail, then she will start taking the Eliquis 5mg  twice day. She states she did not want it sent locally because it costs to much to get locally.  I did advise the pt that she will run out of her current supply sooner because of the increase & advised that we can send locally but she stated she would await her mail order & inform us if needed.  Eliquis 5mg  twice a day sent in to [facility] as requested by the patient.

## 2016-03-23 ENCOUNTER — Ambulatory Visit: Payer: Commercial Managed Care - HMO | Admitting: Cardiovascular Disease

## 2016-04-03 ENCOUNTER — Telehealth: Payer: Self-pay | Admitting: Cardiovascular Disease

## 2016-04-03 MED ORDER — APIXABAN 5 MG PO TABS: 5.0000 mg | ORAL_TABLET | Freq: Two times a day (BID) | ORAL | 1 refills | Status: DC

## 2016-04-03 NOTE — Telephone Encounter (Signed)
[facility] pharmacy is requesting a 90 day supply of Eliquis 2.5 mg tablet. It was sent in for 30 day. Please advised

## 2016-04-03 NOTE — Telephone Encounter (Signed)
RX sent for 90 day supply for Eliquis 5mg  (the correct dose based on Scr<1.5, Age <80, wt>60kg). Pt has been on this dose.

## 2016-05-13 IMAGING — MR MR LUMBAR SPINE W/O CM
5 series · 44 of 48 positions shown · non-contrast
Comparison: Radiography 06/22/2014

CLINICAL DATA: Low back pain, 6 months duration. No known injury.
No radicular pain.

EXAM:
MRI LUMBAR SPINE WITHOUT CONTRAST
TECHNIQUE: Multiplanar, multisequence MR imaging of the lumbar spine was
performed. No intravenous contrast was administered.

[Series 3: T2 · sagittal · 4.0mm · 0.88mm/px · 6 of 13 slices shown (1 of 2)]
[im 1/13]
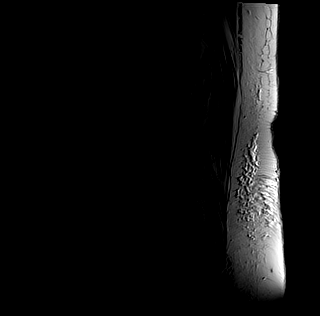
[im 3/13]
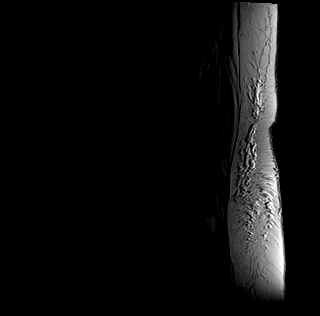
[im 5/13]
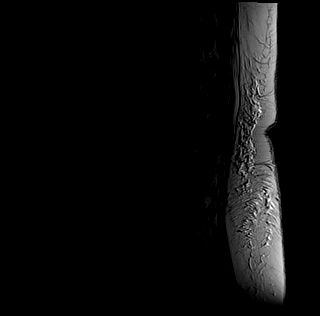
[im 8/13]
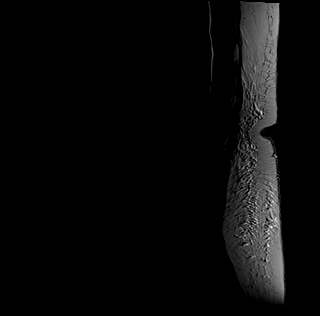
[im 10/13]
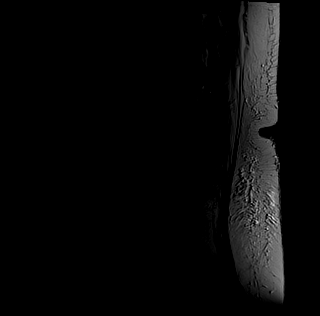
[im 13/13]
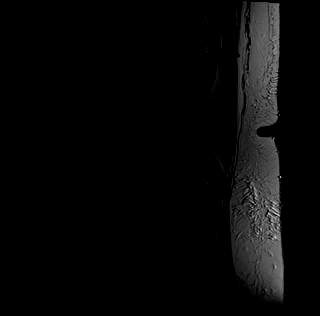

[Series 4: tirm sag · sagittal · 4.0mm · 0.55mm/px · 6 of 13 slices shown]
[im 1/13]
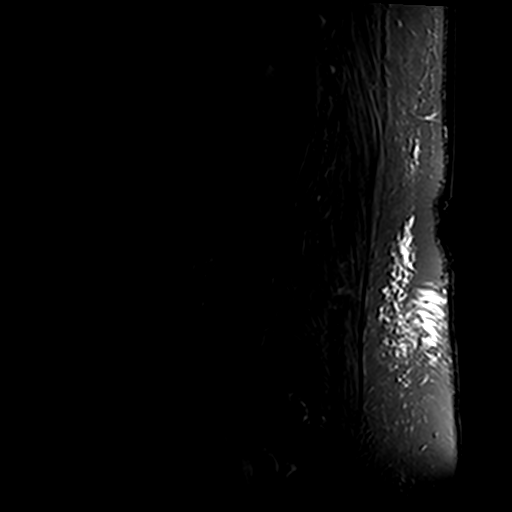
[im 3/13]
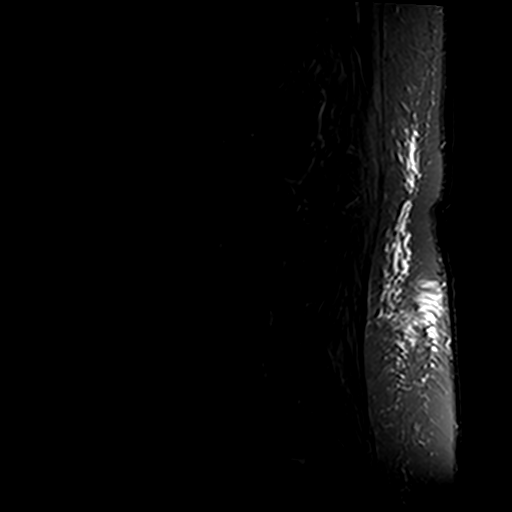
[im 5/13]
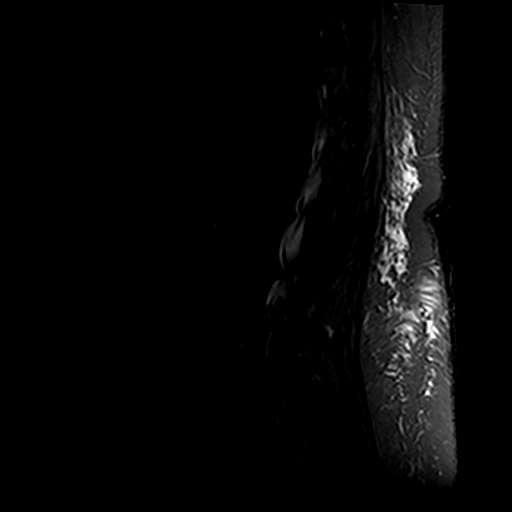
[im 8/13]
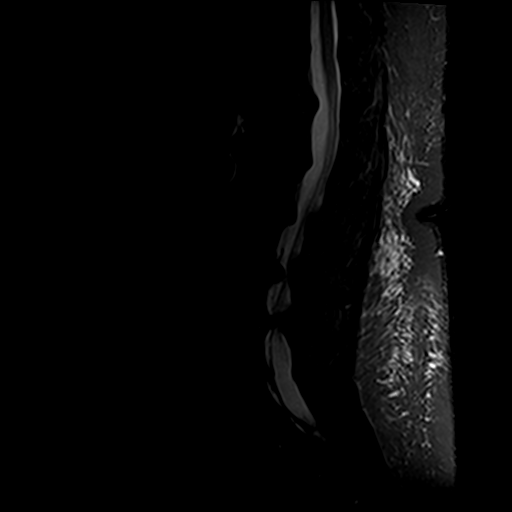
[im 10/13]
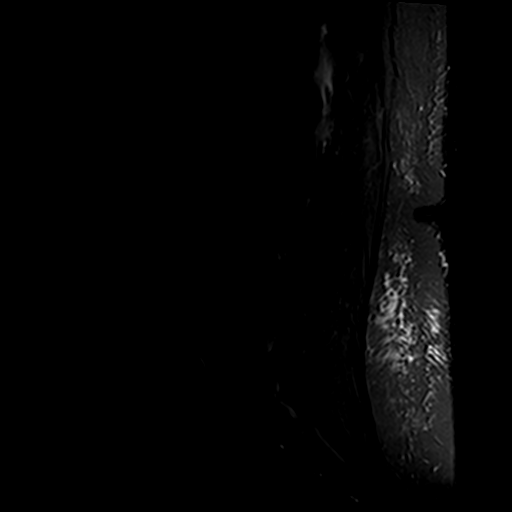
[im 13/13]
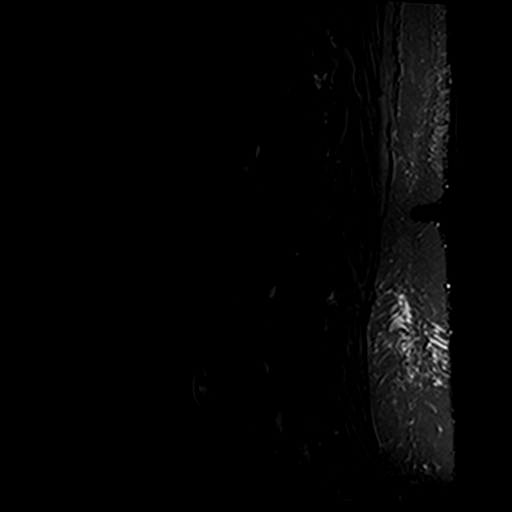

[Series 5: T1 · sagittal · 4.0mm · 0.88mm/px · 6 of 13 slices shown (1 of 2)]
[im 1/13]
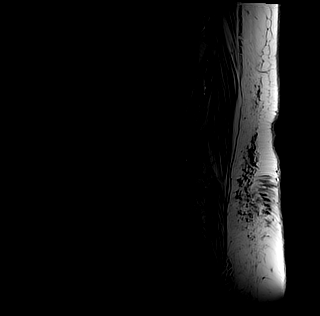
[im 3/13]
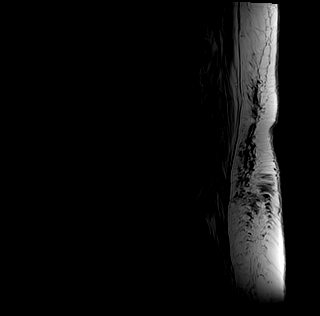
[im 5/13]
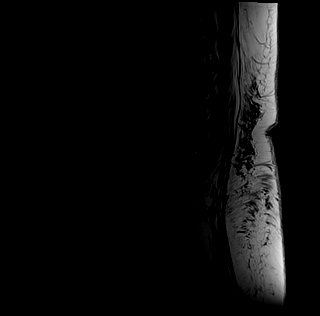
[im 8/13]
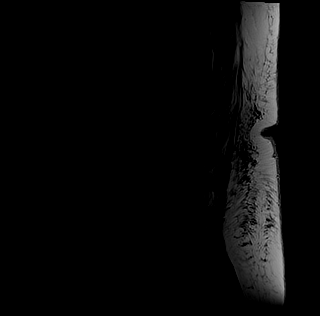
[im 10/13]
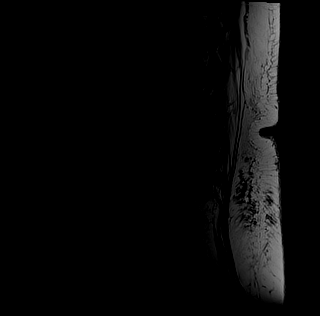
[im 13/13]
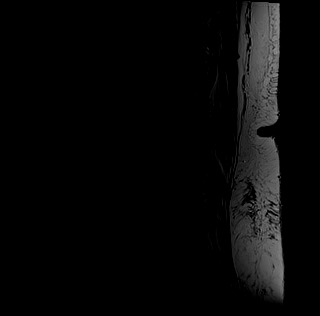

[Series 6: T1 · axial · 4.0mm · 0.78mm/px · z∈[-78,+101]mm · 11 of 34 slices shown (2 of 2)]
[im 1/34]
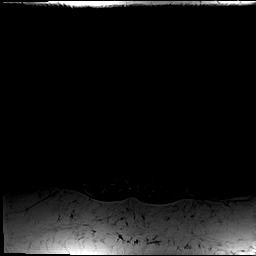
[im 3/34]
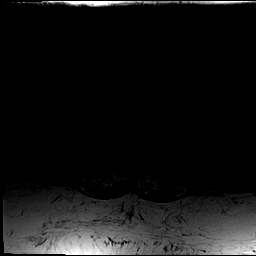
[im 5/34]
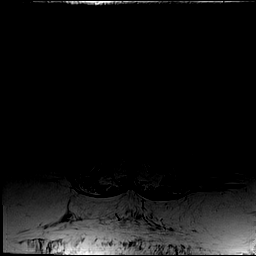
[im 8/34]
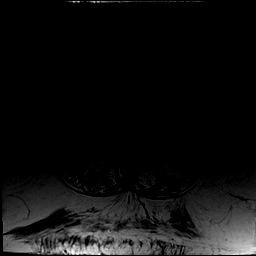
[im 10/34]
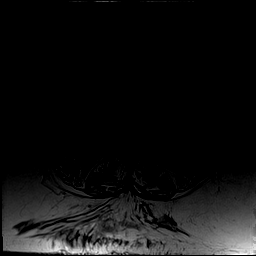
[im 15/34]
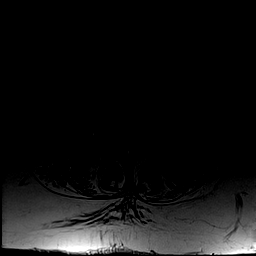
[im 17/34]
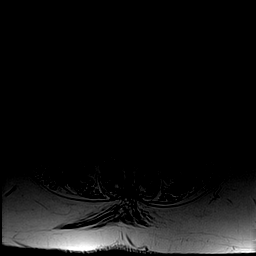
[im 19/34]
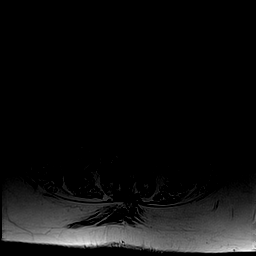
[im 24/34]
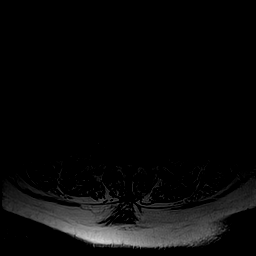
[im 29/34]
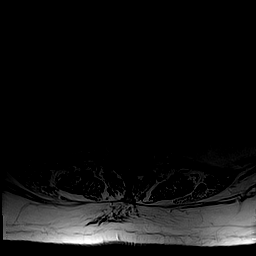
[im 34/34]
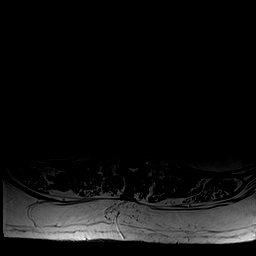

[Series 7: T2 · axial · 4.0mm · 0.78mm/px · z∈[-78,+101]mm · 15 of 34 slices shown (2 of 2)]
[im 1/34]
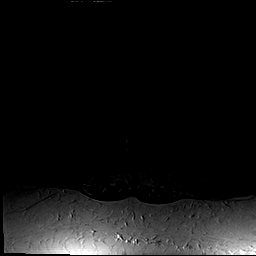
[im 3/34]
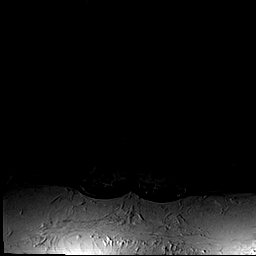
[im 5/34]
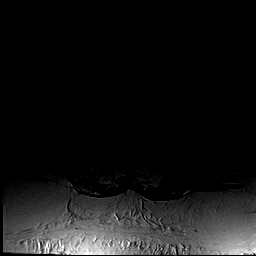
[im 8/34]
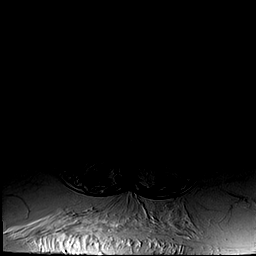
[im 10/34]
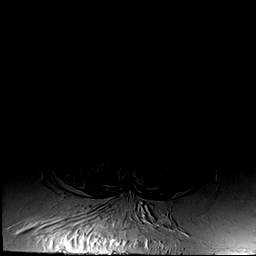
[im 12/34]
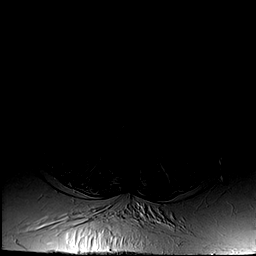
[im 15/34]
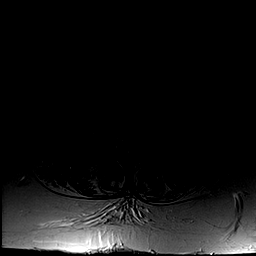
[im 17/34]
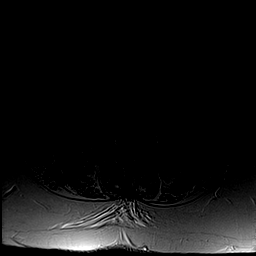
[im 19/34]
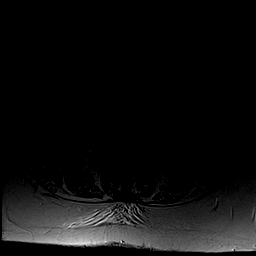
[im 22/34]
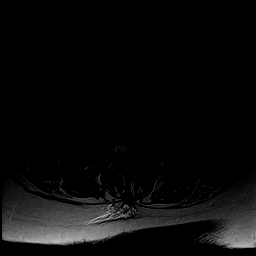
[im 24/34]
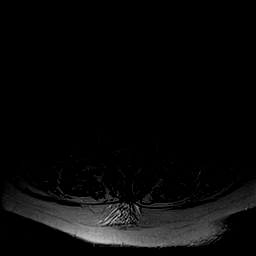
[im 26/34]
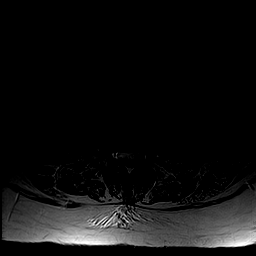
[im 29/34]
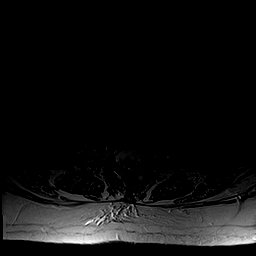
[im 31/34]
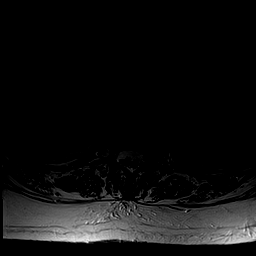
[im 34/34]
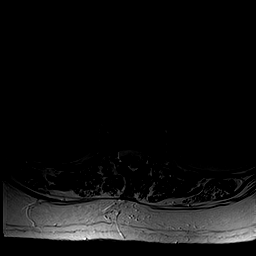

[44 of 48 positions shown; findings below may reference images not displayed]

FINDINGS: T11-12: Normal.

T12-L1: Desiccation and bulging of the disc. No significant
stenosis. Conus tip at this level.

L1-2:  Desiccation and bulging of the disc.  No stenosis.

L2-3: Chronic disc degeneration with loss of disc height. Endplate
osteophytes indent the thecal sac slightly. No stenosis.

L3-4: Broad-based disc herniation more prominent in the left
posterior lateral direction indents the thecal sac. Narrowing of
both lateral recesses that could be symptomatic.

L4-5: Severe multifactorial spinal stenosis because of endplate
osteophytes, bulging of the disc and facet and ligamentous
hypertrophy. This could be a cause of back pain or radicular pain.
Discogenic edema within the marrow could also contribute to back
pain.

L5-S1: Endplate osteophytes and mild bulging of the disc. No
significant stenosis.
IMPRESSION: Severe multifactorial stenosis at the L4-5 level that could be a
cause of back pain or radicular pain. Discogenic edema within the
vertebral marrow could also contribute to back pain.

Broad-based disc herniation at L3-4 more prominent towards the left.
Mild stenosis of both lateral recesses that could be symptomatic.

Chronic, non-compressive degenerative changes at the other levels.

## 2016-05-29 DIAGNOSIS — E119 Type 2 diabetes mellitus without complications: Secondary | ICD-10-CM | POA: Diagnosis not present

## 2016-05-29 DIAGNOSIS — E1141 Type 2 diabetes mellitus with diabetic mononeuropathy: Secondary | ICD-10-CM | POA: Diagnosis not present

## 2016-05-29 DIAGNOSIS — E7800 Pure hypercholesterolemia, unspecified: Secondary | ICD-10-CM | POA: Diagnosis not present

## 2016-05-29 DIAGNOSIS — R251 Tremor, unspecified: Secondary | ICD-10-CM | POA: Diagnosis not present

## 2016-06-05 DIAGNOSIS — G250 Essential tremor: Secondary | ICD-10-CM | POA: Diagnosis not present

## 2016-06-05 DIAGNOSIS — E1165 Type 2 diabetes mellitus with hyperglycemia: Secondary | ICD-10-CM | POA: Diagnosis not present

## 2016-06-19 ENCOUNTER — Other Ambulatory Visit: Payer: Self-pay

## 2016-06-20 MED ORDER — APIXABAN 5 MG PO TABS: 5.0000 mg | ORAL_TABLET | Freq: Two times a day (BID) | ORAL | 5 refills | Status: DC

## 2016-06-20 NOTE — Telephone Encounter (Signed)
Pt returning call to refill concerning her Eliquis. Please call pt.

## 2016-06-20 NOTE — Telephone Encounter (Signed)
Age 76 Wt 93.2kg 11/08/2015 Saw Dr Acie Fredrickson 11/08/2015 12/08/2015 Hgb 13.8 HCT 40.8 12/08/2015 SrCr 1.47 Cancelled order of Eliquis at [facility]  Ordered refill of Eliquis 5mg  q 12 hours  from [facility] as pt requested

## 2016-07-05 DIAGNOSIS — I2510 Atherosclerotic heart disease of native coronary artery without angina pectoris: Secondary | ICD-10-CM | POA: Diagnosis not present

## 2016-07-05 DIAGNOSIS — R251 Tremor, unspecified: Secondary | ICD-10-CM | POA: Diagnosis not present

## 2016-08-21 ENCOUNTER — Institutional Professional Consult (permissible substitution): Payer: Commercial Managed Care - HMO | Admitting: Neurology

## 2016-09-19 ENCOUNTER — Ambulatory Visit: Payer: Commercial Managed Care - HMO | Admitting: Neurology

## 2016-10-18 ENCOUNTER — Telehealth: Payer: Self-pay | Admitting: Cardiovascular Disease

## 2016-10-18 NOTE — Telephone Encounter (Signed)
Spoke with patient and made her aware that I could place two weeks of samples at the front desk. I asked her if she was interested in applying for patient assistance and she stated that she called the company today and they told her that they would be in contact with our office. Patient also asked that I place an application in with her samples in case she still needs to fill one out. Patient very appreciative for my assistance.

## 2016-10-18 NOTE — Telephone Encounter (Signed)
New message    Pt is calling asking if there is a coupon for eliquis.

## 2016-10-18 NOTE — Telephone Encounter (Signed)
Called pt to inform he because she is a medicare pt that the eliquis coupon will not work for her, that the coupons are for pt with private insurance, per [place], [place]. I advised the pt that if she has any other problems, questions or concerns to call the office. Pt verbalized understanding.

## 2016-10-18 NOTE — Telephone Encounter (Signed)
New message   Patient calling the office for samples of medication:   1.  What medication and dosage are you requesting samples for?apixaban (ELIQUIS) 5 MG TABS tablet  2.  Are you currently out of this medication? Pt states "not quite"

## 2016-10-23 DIAGNOSIS — E119 Type 2 diabetes mellitus without complications: Secondary | ICD-10-CM | POA: Diagnosis not present

## 2016-10-23 DIAGNOSIS — H35373 Puckering of macula, bilateral: Secondary | ICD-10-CM | POA: Diagnosis not present

## 2016-10-23 DIAGNOSIS — Z961 Presence of intraocular lens: Secondary | ICD-10-CM | POA: Diagnosis not present

## 2016-10-23 DIAGNOSIS — H26492 Other secondary cataract, left eye: Secondary | ICD-10-CM | POA: Diagnosis not present

## 2016-10-23 DIAGNOSIS — H04123 Dry eye syndrome of bilateral lacrimal glands: Secondary | ICD-10-CM | POA: Diagnosis not present

## 2016-10-23 DIAGNOSIS — H40013 Open angle with borderline findings, low risk, bilateral: Secondary | ICD-10-CM | POA: Diagnosis not present

## 2016-10-25 ENCOUNTER — Encounter: Payer: Self-pay | Admitting: Neurology

## 2016-10-25 ENCOUNTER — Ambulatory Visit (INDEPENDENT_AMBULATORY_CARE_PROVIDER_SITE_OTHER): Payer: Medicare HMO | Admitting: Neurology

## 2016-10-25 VITALS — BP 131/78 | HR 77 | Ht 64.0 in | Wt 181.0 lb

## 2016-10-25 DIAGNOSIS — G252 Other specified forms of tremor: Secondary | ICD-10-CM

## 2016-10-25 NOTE — Patient Instructions (Addendum)
Your leg tremor is unusual, but not in keeping with Parkinson's disease or familial tremor.  I don't believe any of your medications is causing the tremor.  I would like for you to use your walker for safety while walking.  Please talk to Dr. Shelia Media about seeking another opinion at [facility] or [facility] or [facility].  Please do not drive if your foot tremor affects your driving.

## 2016-10-25 NOTE — Progress Notes (Signed)
nffSubjective:    Patient ID: Teresa Peck is a 76 y.o. female.  HPI     Interim history:   Dear Dr. Shelia Media,  I saw your patient, Teresa Peck, upon your kind request in my neurologic clinic today for initial consultation of her lower body tremor. She is accompanied by her friend Shauna Hugh and her niece today. As you know, Teresa Peck is a 76 year old right-handed woman with an underlying medical history of anemia, arthritis, coronary artery disease, skin cancer, CHF, status post MI in 2004, diabetes with diabetic neuropathy, reflux disease, gout, hyperlipidemia, hypertension, and obesity, who reports a lower body and leg tremor for the past few years. I reviewed your office note from 07/05/2016, which you kindly included. I have seen her one time before over 2 years ago for neuropathy at which time she had a very slight upper extremity postural tremor. She was on a higher dose of gabapentin at the time. I considered EMG and nerve conduction testing of her lower extremities but she was not keen on having this done. I suggested as needed follow-up. She was noted to have a intermittent lower body tremor with standing, improved with walking. She was on a beta blocker at the time. I reviewed your office note from 07/05/2016, which you kindly included. She has a lower body or leg tremor for the past 3 years or so. She wonders if it's medication induced. It happens at rest and with standing and feels embarrassing to her. She does not have hand or upper body or neck or head or voice tremors. She reports that her son takes medication for tremors of his head. She does not have a family history of Parkinson's disease. She has no recent history of fall. She has a walker but when she had back surgery but does not use her walker. She lives alone, son lives next door, her daughter sadly passed away from lung cancer.  Previously (copied from previous notes for reference):   10/11/2014: Teresa Peck is a very  pleasant 76 year old right-handed woman with an underlying medical history of coronary artery disease, congestive heart failure, status post MI, status post PTCA, paroxysmal atrial fibrillation, gout, chronic edema, obesity, anxiety, hyperlipidemia, hypertension, and diabetes who has had diabetic neuropathy. She also has a history of tremors for which she saw another neurologist, Dr. Manuella Ghazi in [place]. She follows with Dr. Acie Fredrickson in cardiology. She is currently on metoprolol, Plavix, spironolactone, propranolol, Klor-Con, furosemide, cyclobenzaprine, and gabapentin which is currently at 1200 mg twice daily. She has a tremor, which affects her midline.    She reports that she has pain in her legs and lower back. She lives alone, and denies any falls. Her son lives close my. He has a head tremor.    I reviewed your office note from 08/16/2014, which you kindly included. She had labs on 08/10/2014 through your office which I reviewed: CBC with differential was unremarkable, CMP showed blood sugar level of 158, BUN at 25, creatinine at 1.4, total cholesterol elevated at 262, triglycerides elevated at 394, LDL elevated at 137. SPEP from 05/19/2014 was unremarkable. Hemoglobin A1c on 08/10/2014 was 7.1.  Her Past Medical History Is Significant For: Past Medical History:  Diagnosis Date  . Anemia   . Arthritis   . CAD (coronary artery disease)   . CAD (coronary artery disease)   . Cancer ([place])    skin cancer  . CHF (congestive heart failure) ([place])   . Coronary artery disease    MI  2004-stent  . Diabetes mellitus   . Diabetic neuropathy ([place])   . GERD (gastroesophageal reflux disease)    tums  . Gout   . Hiatal hernia   . Hyperlipidemia   . Hypertension   . PAD (peripheral artery disease) ([place])   . Tremor    Her Past Surgical History Is Significant For: Past Surgical History:  Procedure Laterality Date  . BILATERAL CORNEA IMPLANTS  2010  . CARDIAC CATHETERIZATION     stent post MI   .  CHOLECYSTECTOMY    . CORONARY ANGIOPLASTY     2004  stent  . EYE SURGERY    . [place]   right  . LUMBAR LAMINECTOMY/DECOMPRESSION MICRODISCECTOMY N/A 04/13/2015   Procedure: L4-5 Decompression;  Surgeon: Marybelle Killings, MD;  Location: [facility];  Service: Orthopedics;  Laterality: N/A;  Need RNFA    Her Family History Is Significant For: Family History  Problem Relation Age of Onset  . Heart attack Father   . Heart attack Sister   . Heart attack Brother   . CAD Mother   . Stroke Mother   . Heart disease Mother   . Cancer Mother     Her Social History Is Significant For: Social History   Social History  . Marital status: Divorced    Spouse name: N/A  . Number of children: 2  . Years of education: 8th   Occupational History  . N/A    Social History Main Topics  . Smoking status: Former Smoker    Packs/day: 0.50    Years: 5.00    Types: Cigarettes    Quit date: 02/19/1978  . Smokeless tobacco: Never Used     Comment: Quit 1982  . Alcohol use No  . Drug use: No  . Sexual activity: Not Asked   Other Topics Concern  . None   Social History Narrative   Drinks 2 cups of coffee a day     Her Allergies Are:  Allergies  Allergen Reactions  . Sulfur Rash    Rash/flushed, blood red eyes  :   Her Current Medications Are:  Outpatient Encounter Prescriptions as of 10/25/2016  Medication Sig  . allopurinol (ZYLOPRIM) 300 MG tablet Take 300 mg by mouth daily.  Marland Kitchen apixaban (ELIQUIS) 5 MG TABS tablet Take 1 tablet (5 mg total) by mouth 2 (two) times daily.  . furosemide (LASIX) 20 MG tablet Take 20 mg by mouth daily.  Marland Kitchen gabapentin (NEURONTIN) 600 MG tablet Take 600 mg by mouth 2 (two) times daily.    . insulin glargine (LANTUS) 100 unit/mL SOPN Inject 8 Units into the skin 2 (two) times daily.   . metFORMIN (GLUCOPHAGE) 500 MG tablet Take 500 mg by mouth 2 (two) times daily with a meal.  . nitroGLYCERIN (NITROSTAT) 0.4 MG SL tablet Place 1 tablet (0.4 mg total) under  the tongue every 5 (five) minutes as needed for chest pain.  . RaNITidine HCl (ZANTAC PO) Take 1 tablet by mouth daily.  . rosuvastatin (CRESTOR) 10 MG tablet Take 10 mg by mouth daily.  . metoprolol tartrate (LOPRESSOR) 25 MG tablet Take 1 tablet (25 mg total) by mouth 2 (two) times daily.  . [DISCONTINUED] atorvastatin (LIPITOR) 20 MG tablet Take 20 mg by mouth daily.  . [DISCONTINUED] furosemide (LASIX) 40 MG tablet Take 40 mg by mouth daily.   No facility-administered encounter medications on file as of 10/25/2016.   :  Review of Systems:  Out of a  complete 14 point review of systems, all are reviewed and negative with the exception of these symptoms as listed below: Review of Systems  Neurological:       Pt presents today to discuss her lower body tremors. Pt reports that her tremors are present all day but not as much when she is sleeping. Pt does not have a tremor in her hands, just in her legs.    Objective:  Neurological Exam  Physical Exam Physical Examination:   Vitals:   10/25/16 1259  BP: 131/78  Pulse: 77   General Examination: The patient is a very pleasant 76 y.o. female in no acute distress. She appears well-developed and well-nourished and well groomed.   HEENT: Normocephalic, atraumatic, pupils are equal, round and reactive to light and accommodation. She is status post cataract removal, status post corneal implants. Extraocular tracking is good without limitation to gaze excursion or nystagmus noted. Normal smooth pursuit is noted. Hearing is grossly intact. Face is symmetric with normal facial animation and normal facial sensation. Speech is clear with no dysarthria noted. There is no hypophonia. There is no lip, or jaw or voice tremor and no head tremor.  Neck is supple with full range of passive and active motion. Oropharynx exam reveals: severe mouth dryness, adequate dental hygiene and mild airway crowding. Mallampati is class II. Tongue protrudes centrally and  palate elevates symmetrically.   Chest: Clear to auscultation without wheezing, rhonchi or crackles noted.  Heart: S1+S2+0, regular and normal without murmurs, rubs or gallops noted.   Abdomen: Soft, non-tender and non-distended with normal bowel sounds appreciated on auscultation.  Extremities: There is 1+ pitting edema in the distal lower extremities bilaterally, right more than left.   Skin: Warm and dry without trophic changes noted. There are no varicose veins, but she has chronic appearing changes in her distal legs with mild erythema noted.  Musculoskeletal: exam reveals no obvious joint deformities, tenderness or joint swelling or erythema.   Neurologically:  Mental status: The patient is awake, alert and oriented in all 4 spheres. Her immediate and remote memory, attention, language skills and fund of knowledge are appropriate. There is no evidence of aphasia, agnosia, apraxia or anomia. Speech is clear with normal prosody and enunciation. Thought process is linear. Mood is normal and affect is normal.  Cranial nerves II - XII are as described above under HEENT exam. In addition: shoulder shrug is normal with equal shoulder height noted. Motor exam: Normal bulk, strength and tone is noted. There is no drift, rest tremor or rebound.  she has no postural tremor or action tremor in her upper extremities. She has an intermittent foot bobbing and side-to-side shaking of her thighs. It is somewhat distractible. She has while standing a mild tremulousness in both legs, no tremor or dysmetria with walking. She has no fine motor dyscontrol in the upper or lower extremities. Reflexes are 1+ in the upper extremities, 1+ in the knees and absent in the ankles.  Cerebellar testing: No dysmetria or intention tremor on finger to nose testing. There is no truncal or gait ataxia.  Sensory exam: intact to light touch.   Gait, station and balance: She stands with no significant difficulty. No veering  to one side is noted. No leaning to one side is noted. Posture is age-appropriate and stance is slightly wide-based. She has preserved arm swing with walking and no shuffling is noted.   Assessment and Plan:    In summary, Teresa Peck is a very  pleasant 76 year old female with an underlying medical history of coronary artery disease, congestive heart failure, status post MI, status post PTCA, paroxysmal atrial fibrillation, gout, chronic edema, obesity, anxiety, hyperlipidemia, hypertension, and diabetes who Presents for neurologic evaluation of her lower body and leg tremor. This appears to be intermittent and is unusual in the sense that she does not have a resting tremor per se, she has a side-to-side trembling of her thighs with sitting and also some foot bobbing is noted as well as trembling with standing. Her history and exam are not in keeping with Parkinson's disease, essential tremor, or orthostatic tremor. Unfortunately, I do not have any suggestions as far as symptomatic treatment. She is encouraged to talk to you about seeking another opinion at a academic neurologic Center. She does report that she saw Dr. Manuella Ghazi, neurologist, a couple of times as well.  She is cautioned about her driving as she reports that sometimes her right foot trembles when she puts it on the gas pedal. She is advised not to drive if she feels that she does not have good control over her right foot. She does not have a parkinsonian foot tremor. She is furthermore advised to start using her walker for gait safety. Thankfully, she has not fallen. She is advised to follow-up with you as planned and consider seeking another opinion at a tertiary care center. I answered all their questions today and the patient was in agreement.  Star Age, MD, PhD [facility])

## 2016-10-30 ENCOUNTER — Encounter: Payer: Self-pay | Admitting: Cardiovascular Disease

## 2016-10-30 ENCOUNTER — Ambulatory Visit (INDEPENDENT_AMBULATORY_CARE_PROVIDER_SITE_OTHER): Payer: Medicare HMO | Admitting: Cardiovascular Disease

## 2016-10-30 VITALS — BP 118/70 | HR 74 | Ht 64.0 in | Wt 184.2 lb

## 2016-10-30 DIAGNOSIS — I2510 Atherosclerotic heart disease of native coronary artery without angina pectoris: Secondary | ICD-10-CM

## 2016-10-30 DIAGNOSIS — I482 Chronic atrial fibrillation, unspecified: Secondary | ICD-10-CM

## 2016-10-30 DIAGNOSIS — E782 Mixed hyperlipidemia: Secondary | ICD-10-CM | POA: Diagnosis not present

## 2016-10-30 DIAGNOSIS — I2583 Coronary atherosclerosis due to lipid rich plaque: Secondary | ICD-10-CM

## 2016-10-30 DIAGNOSIS — I5022 Chronic systolic (congestive) heart failure: Secondary | ICD-10-CM

## 2016-10-30 NOTE — Patient Instructions (Signed)
Medication Instructions:  Your physician recommends that you continue on your current medications as directed. Please refer to the Current Medication list given to you today.   Labwork: TODAY - magnesium, basic metabolic panel, cholesterol, liver panel   Testing/Procedures: None Ordered   Follow-Up: Your physician wants you to follow-up in: 6 months with Dr. Acie Fredrickson.  You will receive a reminder letter in the mail two months in advance. If you don't receive a letter, please call our office to schedule the follow-up appointment.   If you need a refill on your cardiac medications before your next appointment, please call your pharmacy.   Thank you for choosing [facility]! Christen Bame, RN [phone]

## 2016-10-30 NOTE — Progress Notes (Signed)
Cardiology Office Note   Date:  10/30/2016   ID:  Teresa Peck, DOB [birth year], MRN [id number]  PCP:  Deland Pretty, MD  Cardiologist:   Mertie Moores, MD   Chief Complaint  Patient presents with  . Follow-up    Atrial fib   Problem list: 1. Coronary artery disease: She has an occluded LAD. She status post stenting of her first diagonal 2. Hyperlipidemia 3. Chronic systolic congestive heart failure 4. Essential hypertension 5. 5. Diabetes mellitus - peripheral neuropathy     History of Present Illness: Teresa Peck is a 76 y.o. female who presents for follow-up of her coronary artery disease. She's been seen in the past by Dr. Rockey Situ. She was seen with her Daughter, [name].  She had a cath in 2004 and had stenting of her 1st diag. Dr. Minna Antis has stopped some of her meds ( Coreg) also stopped allopurinol and Digoxin.   She denies any cp.  Breathing is ok.   Eats lots of Progresso soup.  She does not exercise.   Knows that she needs to walk more.  Has claudication with ambulation .   Aug. 30, 2016:   Doing well.  Complains of leg pain - was told that she had diabetic neuropathy Lots of leg swelling ,  Still eating lots of soup and canned foods.   Dec. 2 , 2016:  Breathing is good.  Leg Swelling is better  She thinks the Actos is causing fluid retention and weight gain .   May 17, 2015:  Has had her back surgery  ,.   Legs and back feel much better.  She was hypotensive prior to her back surgery and we held her coreg and Quinipril   Sept. 19, 2017:  [place] is seen back for a follow up visit. Has cut out her salt. Has lots 8 lbs.  Breathing is ok   Cannot tell that her HR is irregular.   Sept. 11, 2018:  Seen today for follow  Breathing is going well.     Past Medical History:  Diagnosis Date  . Anemia   . Arthritis   . CAD (coronary artery disease)   . CAD (coronary artery disease)   . Cancer ([place])    skin cancer  . CHF  (congestive heart failure) ([place])   . Coronary artery disease    MI 2004-stent  . Diabetes mellitus   . Diabetic neuropathy ([place])   . GERD (gastroesophageal reflux disease)    tums  . Gout   . Hiatal hernia   . Hyperlipidemia   . Hypertension   . PAD (peripheral artery disease) ([place])   . Tremor     Past Surgical History:  Procedure Laterality Date  . BILATERAL CORNEA IMPLANTS  2010  . CARDIAC CATHETERIZATION     stent post MI   . CHOLECYSTECTOMY    . CORONARY ANGIOPLASTY     2004  stent  . EYE SURGERY    . [place]   right  . LUMBAR LAMINECTOMY/DECOMPRESSION MICRODISCECTOMY N/A 04/13/2015   Procedure: L4-5 Decompression;  Surgeon: Marybelle Killings, MD;  Location: [facility];  Service: Orthopedics;  Laterality: N/A;  Need RNFA     Current Outpatient Prescriptions  Medication Sig Dispense Refill  . allopurinol (ZYLOPRIM) 300 MG tablet Take 300 mg by mouth daily.    Marland Kitchen apixaban (ELIQUIS) 5 MG TABS tablet Take 1 tablet (5 mg total) by mouth 2 (two) times daily. 60 tablet 5  .  furosemide (LASIX) 20 MG tablet Take 20 mg by mouth daily.    Marland Kitchen gabapentin (NEURONTIN) 600 MG tablet Take 600 mg by mouth 2 (two) times daily.      . insulin glargine (LANTUS) 100 unit/mL SOPN Inject 8 Units into the skin 2 (two) times daily.     . metFORMIN (GLUCOPHAGE) 500 MG tablet Take 500 mg by mouth 2 (two) times daily with a meal.    . metoprolol tartrate (LOPRESSOR) 25 MG tablet Take 25 mg by mouth 2 (two) times daily.    . nitroGLYCERIN (NITROSTAT) 0.4 MG SL tablet Place 1 tablet (0.4 mg total) under the tongue every 5 (five) minutes as needed for chest pain. 25 tablet 6  . RaNITidine HCl (ZANTAC PO) Take 1 tablet by mouth daily.    . rosuvastatin (CRESTOR) 10 MG tablet Take 10 mg by mouth daily.     No current facility-administered medications for this visit.     Allergies:   Sulfur    Social History:  The patient  reports that she quit smoking about 38 years ago. Her smoking use included  Cigarettes. She has a 2.50 pack-year smoking history. She has never used smokeless tobacco. She reports that she does not drink alcohol or use drugs.   Family History:  The patient's family history includes CAD in her mother; Cancer in her mother; Heart attack in her brother, father, and sister; Heart disease in her mother; Stroke in her mother.    ROS:  Please see the history of present illness.    Review of Systems: Constitutional:  denies fever, chills, diaphoresis, appetite change and fatigue.  HEENT: denies photophobia, eye pain, redness, hearing loss, ear pain, congestion, sore throat, rhinorrhea, sneezing, neck pain, neck stiffness and tinnitus.  Respiratory: denies SOB, DOE, cough, chest tightness, and wheezing.  Cardiovascular: denies chest pain, palpitations and leg swelling.  Gastrointestinal: denies nausea, vomiting, abdominal pain, diarrhea, constipation, blood in stool.  Genitourinary: denies dysuria, urgency, frequency, hematuria, flank pain and difficulty urinating.  Musculoskeletal: admits to  claudication in both legs with walking.   Skin: denies pallor, rash and wound.  Neurological: denies dizziness, seizures, syncope, weakness, light-headedness, numbness and headaches.   Hematological: denies adenopathy, easy bruising, personal or family bleeding history.  Psychiatric/ Behavioral: denies suicidal ideation, mood changes, confusion, nervousness, sleep disturbance and agitation.       All other systems are reviewed and negative.    PHYSICAL EXAM: VS:  BP 118/70   Pulse 74   Ht 5\' 4"  (1.626 m)   Wt 184 lb 3.2 oz (83.6 kg)   BMI 31.62 kg/m  , BMI Body mass index is 31.62 kg/m. GEN: Well nourished, well developed, in no acute distress  HEENT: normal  Neck: no JVD, carotid bruits, or masses Cardiac: irreg. Irreg. ; no murmurs, rubs, or gallops,no edema  Respiratory:  clear to auscultation bilaterally, normal work of breathing GI: soft, nontender, nondistended, +  BS MS: no deformity or atrophy  Skin: warm and dry, no rash Neuro:  Strength and sensation are intact Psych: normal  EKG:  EKG is ordered today. Sept. 19, 2017.   Atrial fib with HR of 96.   The atrial fib is new since her previous ECG   Recent Labs: 11/10/2015: ALT 14; TSH 3.51 12/08/2015: BUN 26; Creat 1.47; Hemoglobin 13.8; Platelets 226; Potassium 4.8; Sodium 139    Lipid Panel No results found for: CHOL, TRIG, HDL, CHOLHDL, VLDL, LDLCALC, LDLDIRECT    Wt Readings from Last  3 Encounters:  10/30/16 184 lb 3.2 oz (83.6 kg)  10/25/16 181 lb (82.1 kg)  11/08/15 205 lb 6.4 oz (93.2 kg)     Other studies Reviewed: Additional studies/ records that were reviewed today include: . Review of the above records demonstrates:    ASSESSMENT AND PLAN:  1. Coronary artery disease: She has an occluded LAD. She status post stenting of her first diagonal.  She's not having any recent episodes of angina.       2. Atrial Fibrillation :   Is doing well .  Thinks the Eliquis may be causing some tingling in her left leg.   CHADS2VASC is  42  ( female, age 60, DM,  HTN)  On  Eliquis 5 mg BID  Rate is well controlled.   3. Chronic systolic congestive heart failure - she has an occluded LAD and has an ischemic cardiopathy.      4. Essential hypertension - blood pressure is well-controlled.  5. 5. Diabetes mellitus - she likely has a peripheral neuropathy   6. Leg edema :   Encouraged leg elevation and compression hose. Marland Kitchen Avoid salt , avoid canned foods.   Current medicines are reviewed at length with the patient today.  The patient does not have concerns regarding medicines.     Disposition:   FU with me in  3  months.     Mertie Moores, MD  10/30/2016 1:59 PM    [facility] [place], [place], [place]  [address] Phone: [phone]; Fax: [phone]

## 2016-10-31 ENCOUNTER — Telehealth: Payer: Self-pay | Admitting: Nurse Practitioner

## 2016-10-31 LAB — LIPID PANEL
Chol/HDL Ratio: 2.3 ratio (ref 0.0–4.4)
Cholesterol, Total: 132 mg/dL (ref 100–199)
HDL: 57 mg/dL (ref >39)
LDL Calculated: 54 mg/dL (ref 0–99)
Triglycerides: 107 mg/dL (ref 0–149)
VLDL Cholesterol Cal: 21 mg/dL (ref 5–40)

## 2016-10-31 LAB — BASIC METABOLIC PANEL
BUN/Creatinine Ratio: 15 (ref 12–28)
BUN: 17 mg/dL (ref 8–27)
CO2: 21 mmol/L (ref 20–29)
Calcium: 9.6 mg/dL (ref 8.7–10.3)
Chloride: 100 mmol/L (ref 96–106)
Creatinine, Ser: 1.12 mg/dL — ABNORMAL HIGH (ref 0.57–1.00)
GFR calc Af Amer: 54 mL/min/{1.73_m2} — ABNORMAL LOW (ref >59)
GFR calc non Af Amer: 47 mL/min/{1.73_m2} — ABNORMAL LOW (ref >59)
Glucose: 212 mg/dL — ABNORMAL HIGH (ref 65–99)
POTASSIUM: 5.4 mmol/L — AB (ref 3.5–5.2)
Sodium: 141 mmol/L (ref 134–144)

## 2016-10-31 LAB — HEPATIC FUNCTION PANEL
ALBUMIN: 4.3 g/dL (ref 3.5–4.8)
ALT: 20 IU/L (ref 0–32)
AST: 28 IU/L (ref 0–40)
Alkaline Phosphatase: 77 IU/L (ref 39–117)
BILIRUBIN TOTAL: 0.5 mg/dL (ref 0.0–1.2)
BILIRUBIN, DIRECT: 0.17 mg/dL (ref 0.00–0.40)
Total Protein: 6.6 g/dL (ref 6.0–8.5)

## 2016-10-31 LAB — MAGNESIUM: MAGNESIUM: 1.7 mg/dL (ref 1.6–2.3)

## 2016-10-31 MED ORDER — MAGNESIUM OXIDE -MG SUPPLEMENT 400 (240 MG) MG PO TABS: 400.0000 mg | ORAL_TABLET | Freq: Every day | ORAL | Status: AC

## 2016-10-31 NOTE — Telephone Encounter (Signed)
Reviewed lab results and plan of care with patient who verbalized understanding and agreement. She states she has taken the magnesium supplement in the past and is aware of the dose. She thanked me for the call.

## 2016-10-31 NOTE — Telephone Encounter (Signed)
-----   Message from Thayer Headings, MD sent at 10/31/2016 12:04 PM EDT ----- BMP is ok Mag level is a little low. Recommend Mag ox 400 mg a day

## 2016-11-19 ENCOUNTER — Other Ambulatory Visit: Payer: Self-pay | Admitting: Cardiovascular Disease

## 2016-11-20 ENCOUNTER — Other Ambulatory Visit: Payer: Self-pay

## 2016-11-20 MED ORDER — METOPROLOL TARTRATE 25 MG PO TABS: 25.0000 mg | ORAL_TABLET | Freq: Two times a day (BID) | ORAL | 11 refills | Status: DC

## 2016-11-21 NOTE — Telephone Encounter (Signed)
Medication Detail    Disp Refills Start End   metoprolol tartrate (LOPRESSOR) 25 MG tablet 60 tablet 11 11/20/2016    Sig - Route: Take 1 tablet (25 mg total) by mouth 2 (two) times daily. - Oral   Sent to pharmacy as: metoprolol tartrate (LOPRESSOR) 25 MG tablet   E-Prescribing Status: Receipt confirmed by pharmacy (11/20/2016 2:45 PM EDT)   Pharmacy   [facility] [place], [place] - [address]

## 2016-12-18 ENCOUNTER — Telehealth: Payer: Self-pay | Admitting: Cardiovascular Disease

## 2016-12-18 NOTE — Telephone Encounter (Signed)
Patient calling the office for samples of medication:   1.  What medication and dosage are you requesting samples for? eliquis 5mg   2.  Are you currently out of this medication? almost

## 2016-12-19 NOTE — Telephone Encounter (Signed)
Follow Up:    Pt calling to see if there were any samples for Eliquis?

## 2016-12-19 NOTE — Telephone Encounter (Signed)
The pt called the office requesting samples of eliquis. She was asked by a member of the refill team if she has turned in her [employer] Pt assistance application and she stated yes. She was given [employer]'s phone number to call and check the status of her application as we can not find it.  She stated that her daughter in law returned her application to [place].  I called the pt back to discuss her [employer] pt assistance application. I advised her that we never received it. She stated that her daughter in law brought it back to [place] but was unsure of when. I asked her if she called [employer] to ask if they received it and she she had and that they have not received it.  I advised her that since we never got it she will need to re apply. I advised her that she will need to bring in a letter from her pharmacy showing her out of pocket expense report from her pharmacy and proof of income.  She seemed really confused because then she stated that she mailed everything to me and I replied I thought your daughter in law dropped it off here at the office and she said "well she did".  I offered to leave her another application at the front desk with her Eliquis samples and she stated I have an application already and I said where did you get it and she stated that [employer] mailed it to her. I asked her to get that application so I could explain it to her. She went and got the application, came back to the phone and stated "its got your name written on it". I asked her what application was brought back to me by her daughter in law and she then said"I dont know whats going on".  She is advised to complete her part of the application and return it to [place] ASAP as we cannot always supply her Eliquis samples.  She states that she will have her daughter in law help her and if not she will call us back for help.

## 2016-12-24 MED ORDER — APIXABAN 5 MG PO TABS: 5.0000 mg | ORAL_TABLET | Freq: Two times a day (BID) | ORAL | 3 refills | Status: DC

## 2016-12-24 NOTE — Telephone Encounter (Signed)
**Note De-Identified Alexanderjames Berg Obfuscation** The pt brought in her completed [employer] pt assistance application along with her out of pocket expense report from her pharmacy and proof of income.  I have completed the provider part of the application and printed a Eliquis RX. I have placed both in Dr Lanny Hurst mail bin awaiting his signature.

## 2016-12-24 NOTE — Telephone Encounter (Signed)
Dr Acie Fredrickson has signed the application and Eliquis RX. I have faxed all to [place] pt assistance program.

## 2016-12-27 NOTE — Telephone Encounter (Signed)
Received fax from [place] patient assistance foundation regarding patients ELIQUIS. It states patient IS NOT ELIGIBLE for assistance because the product is covered by insurance.

## 2016-12-27 NOTE — Telephone Encounter (Signed)
Called Teresa Peck and informed her that [place] denied her request for patient assistance with her Arne [place] stating she has coverage from her insurance. Patient states she doesn't have insurance to cover her medications but upon review with her she realizes she does with [employer]. Her co pay with eliquis, now that she is in donut hole, is $150.00/month. She is going to pick up a 15 day supply today for $73.00 because this is all she can afford.   She states she was on plavix at one time by Dr Glade Lloyd and wonders if she can restart this, she said it wasn't as expensive but doesn't know how much it was exactly. I instructed her to call [facility] and ask them how much plavix would be per month and how much the eliquis will be when she comes out of donut hole in 2019.   She does also state that Dr Acie Fredrickson had mentioned to her coumadin but she really doesn't want to take that. I informed her of risks of not taking a blood thinner with her diagnosis.

## 2016-12-27 NOTE — Telephone Encounter (Signed)
She needs eliquis or one of the other DOACS or coumadin. Plavix is not adequate for preventing CVA with atrial fib

## 2016-12-28 NOTE — Telephone Encounter (Signed)
Called patient and explained that she cannot take Plavix in the place of Eliquis, Xarelto, or Coumadin. I explained the different action to her and she verbalized understanding. She states she will continue on eliquis. She thanked me for the call.

## 2017-01-07 NOTE — Telephone Encounter (Signed)
The pt states that Eliquis is too expensive for her. Pt assistance says that she has insurance coverage. She was advised of this on 12/27/16 and at that time she stated she would pay out of pocket for it.  Today she is calling stating that she needs more Eliquis because she is almost out. I explained to her that our samples are for new starts and that her options at this point is to switch to Coumadin or pay for her Eliquis. She is requesting to talk with Florene Glen, RN (Dr Elmarie Shiley nurse).  Will forward message to [place].

## 2017-01-07 NOTE — Telephone Encounter (Signed)
Spoke with patient and advised that the cost of eliquis will decrease again in January. She states she thinks that she can afford to continue it until the end of the year. I advised her to call back if a sample is needed but advised we cannot give her enough to finish the year. She verbalized understanding and agreement and thanked me for the call.

## 2017-01-16 ENCOUNTER — Other Ambulatory Visit: Payer: Self-pay

## 2017-01-16 MED ORDER — APIXABAN 5 MG PO TABS: 5.0000 mg | ORAL_TABLET | Freq: Two times a day (BID) | ORAL | 3 refills | Status: DC

## 2017-01-31 ENCOUNTER — Encounter: Payer: Self-pay | Admitting: Podiatry

## 2017-01-31 ENCOUNTER — Ambulatory Visit: Payer: Medicare HMO | Admitting: Podiatry

## 2017-01-31 DIAGNOSIS — E1140 Type 2 diabetes mellitus with diabetic neuropathy, unspecified: Secondary | ICD-10-CM

## 2017-01-31 DIAGNOSIS — M79675 Pain in left toe(s): Secondary | ICD-10-CM

## 2017-01-31 DIAGNOSIS — Q828 Other specified congenital malformations of skin: Secondary | ICD-10-CM

## 2017-01-31 DIAGNOSIS — M79674 Pain in right toe(s): Secondary | ICD-10-CM

## 2017-01-31 DIAGNOSIS — E1149 Type 2 diabetes mellitus with other diabetic neurological complication: Secondary | ICD-10-CM

## 2017-01-31 DIAGNOSIS — D689 Coagulation defect, unspecified: Secondary | ICD-10-CM | POA: Diagnosis not present

## 2017-01-31 DIAGNOSIS — B351 Tinea unguium: Secondary | ICD-10-CM | POA: Diagnosis not present

## 2017-01-31 NOTE — Progress Notes (Signed)
   Subjective:    Patient ID: Teresa Peck, female    DOB: Jan 13, 1941, 76 y.o.   MRN: [id number]  HPI    Review of Systems  All other systems reviewed and are negative.      Objective:   Physical Exam        Assessment & Plan:

## 2017-02-14 ENCOUNTER — Telehealth: Payer: Self-pay | Admitting: *Deleted

## 2017-02-14 NOTE — Telephone Encounter (Signed)
Received fax from [place] stating patient has been approved for [employer] through 02/18/2017. Patient notified via phone of approval.

## 2017-02-15 ENCOUNTER — Telehealth: Payer: Self-pay | Admitting: Cardiovascular Disease

## 2017-02-15 NOTE — Telephone Encounter (Signed)
New Message   Pt c/o medication issue:  1. Name of Medication: Eliquis  2. How are you currently taking this medication (dosage and times per day)? 5 mg 2 times a day   3. Are you having a reaction (difficulty breathing--STAT)? No   4. What is your medication issue? Patient has started taking B complex and is wanting to know will it interfere with the Eliquis

## 2017-02-15 NOTE — Telephone Encounter (Signed)
Called patient about her taking complex B and vitamins. Informed patient she should be fine taking vitamins. Patient was concerned that her urine was lime green. Informed patient that some vitamins can give your urine a different color. Encouraged patient to drink some water with her vitamins. Will forward to Dr. Elmarie Shiley nurse for further follow up.

## 2017-02-20 ENCOUNTER — Telehealth: Payer: Self-pay

## 2017-02-20 NOTE — Telephone Encounter (Signed)
Noted, no further action needed.

## 2017-02-20 NOTE — Telephone Encounter (Signed)
**Note De-Identified [employer] Loll Obfuscation** We received a Pt Assistance application in the mail from [employer]. I have placed the application in our mail bin addressed to the pt. so that she can re-apply for pt assistance for her Eliquis once she meets the eligibility requirements.  I completed the providers part of the application and placed it in the yellow folder in the PA dept.

## 2017-03-18 ENCOUNTER — Other Ambulatory Visit: Payer: Self-pay

## 2017-03-18 MED ORDER — APIXABAN 5 MG PO TABS: 5.0000 mg | ORAL_TABLET | Freq: Two times a day (BID) | ORAL | 3 refills | Status: DC

## 2017-03-18 NOTE — Telephone Encounter (Addendum)
The pt called to let me know that she has filled out her part of the [employer] Pt assistance application for Eliquis and that she has mailed her part back on Friday 03/15/17. She is requesting that I fax in the provider part of the application along with a Eliquis RX.  I have completed the provider part of the application and printed a RX for Eliquis and placed both in Dr Lanny Hurst mail bin awaiting his signature.

## 2017-03-19 NOTE — Telephone Encounter (Signed)
Dr Acie Fredrickson has signed the BMS pt assistance application and Eliquis RX. I have faxed all to BMS pt assistance foundation.

## 2017-04-11 DIAGNOSIS — F325 Major depressive disorder, single episode, in full remission: Secondary | ICD-10-CM | POA: Diagnosis not present

## 2017-04-11 DIAGNOSIS — R251 Tremor, unspecified: Secondary | ICD-10-CM | POA: Diagnosis not present

## 2017-04-11 DIAGNOSIS — E1165 Type 2 diabetes mellitus with hyperglycemia: Secondary | ICD-10-CM | POA: Diagnosis not present

## 2017-05-02 ENCOUNTER — Telehealth: Payer: Self-pay | Admitting: *Deleted

## 2017-05-02 NOTE — Telephone Encounter (Signed)
Received fax from [place] regarding patient assistance with Teresa Peck, it is approved free of charge through 02/18/2018. I left patient a message on phone regarding this information.

## 2017-05-09 ENCOUNTER — Ambulatory Visit: Payer: Medicare HMO | Admitting: Neurology

## 2017-06-04 ENCOUNTER — Encounter: Payer: Self-pay | Admitting: Cardiovascular Disease

## 2017-06-04 ENCOUNTER — Ambulatory Visit (INDEPENDENT_AMBULATORY_CARE_PROVIDER_SITE_OTHER): Payer: Medicare HMO | Admitting: Cardiovascular Disease

## 2017-06-04 VITALS — BP 109/74 | HR 80 | Ht 64.0 in | Wt 175.8 lb

## 2017-06-04 DIAGNOSIS — I739 Peripheral vascular disease, unspecified: Secondary | ICD-10-CM

## 2017-06-04 DIAGNOSIS — I482 Chronic atrial fibrillation, unspecified: Secondary | ICD-10-CM

## 2017-06-04 NOTE — Progress Notes (Signed)
Cardiology Office Note   Date:  06/04/2017   ID:  Teresa Peck, DOB Sep 17, 1940, MRN [id number]  PCP:  Deland Pretty, MD  Cardiologist:   Mertie Moores, MD   Chief Complaint  Patient presents with  . Coronary Artery Disease  . Atrial Fibrillation   Problem list: 1. Coronary artery disease: She has an occluded LAD. She status post stenting of her first diagonal 2. Hyperlipidemia 3. Chronic systolic congestive heart failure 4. Essential hypertension 5. 5. Diabetes mellitus - peripheral neuropathy     History of Present Illness: Teresa Peck is a 77 y.o. female who presents for follow-up of her coronary artery disease. She's been seen in the past by Dr. Rockey Situ. She was seen with her Daughter, [name].  She had a cath in 2004 and had stenting of her 1st diag. Dr. Minna Antis has stopped some of her meds ( Coreg) also stopped allopurinol and Digoxin.   She denies any cp.  Breathing is ok.   Eats lots of Progresso soup.  She does not exercise.   Knows that she needs to walk more.  Has claudication with ambulation .   Aug. 30, 2016:   Doing well.  Complains of leg pain - was told that she had diabetic neuropathy Lots of leg swelling ,  Still eating lots of soup and canned foods.   Dec. 2 , 2016:  Breathing is good.  Leg Swelling is better  She thinks the Actos is causing fluid retention and weight gain .   May 17, 2015:  Has had her back surgery  ,.   Legs and back feel much better.  She was hypotensive prior to her back surgery and we held her coreg and Quinipril   Sept. 19, 2017:  Lorriane is seen back for a follow up visit. Has cut out her salt. Has lots 8 lbs.  Breathing is ok   Cannot tell that her HR is irregular.   Sept. 11, 2018:  Seen today for follow  Breathing is going well.   June 04, 2017:   [name] is seen today for follow-up of her coronary artery disease, chronic systolic congestive heart failure, atrial fibrillation.  She also  has a history of hyperlipidemia.  Has bilateral leg pain when she walks   Past Medical History:  Diagnosis Date  . Anemia   . Arthritis   . CAD (coronary artery disease)   . CAD (coronary artery disease)   . Cancer ([place])    skin cancer  . CHF (congestive heart failure) ([place])   . Coronary artery disease    MI 2004-stent  . Diabetes mellitus   . Diabetic neuropathy ([place])   . GERD (gastroesophageal reflux disease)    tums  . Gout   . Hiatal hernia   . Hyperlipidemia   . Hypertension   . PAD (peripheral artery disease) ([place])   . Tremor     Past Surgical History:  Procedure Laterality Date  . BILATERAL CORNEA IMPLANTS  2010  . CARDIAC CATHETERIZATION     stent post MI   . CHOLECYSTECTOMY    . CORONARY ANGIOPLASTY     2004  stent  . EYE SURGERY    . [place]   right  . LUMBAR LAMINECTOMY/DECOMPRESSION MICRODISCECTOMY N/A 04/13/2015   Procedure: L4-5 Decompression;  Surgeon: Marybelle Killings, MD;  Location: [facility];  Service: Orthopedics;  Laterality: N/A;  Need RNFA     Current Outpatient Medications  Medication Sig  Dispense Refill  . allopurinol (ZYLOPRIM) 300 MG tablet Take 300 mg by mouth daily.    Marland Kitchen apixaban (ELIQUIS) 5 MG TABS tablet Take 1 tablet (5 mg total) by mouth 2 (two) times daily. 180 tablet 3  . furosemide (LASIX) 20 MG tablet Take 20 mg by mouth daily.    Marland Kitchen gabapentin (NEURONTIN) 600 MG tablet Take 600 mg by mouth 2 (two) times daily.      . insulin glargine (LANTUS) 100 unit/mL SOPN Inject 8 Units into the skin 2 (two) times daily.     . Magnesium Oxide 400 (240 Mg) MG TABS Take 1 tablet (400 mg total) by mouth daily. 30 tablet   . metFORMIN (GLUCOPHAGE) 500 MG tablet Take 500 mg by mouth 2 (two) times daily with a meal.    . metoprolol tartrate (LOPRESSOR) 25 MG tablet Take 1 tablet (25 mg total) by mouth 2 (two) times daily. 60 tablet 11  . nitroGLYCERIN (NITROSTAT) 0.4 MG SL tablet Place 1 tablet (0.4 mg total) under the tongue every 5 (five)  minutes as needed for chest pain. 25 tablet 6  . RaNITidine HCl (ZANTAC PO) Take 1 tablet by mouth daily.    . rosuvastatin (CRESTOR) 10 MG tablet Take 10 mg by mouth daily.     No current facility-administered medications for this visit.     Allergies:   Sulfur    Social History:  The patient  reports that she quit smoking about 39 years ago. Her smoking use included cigarettes. She has a 2.50 pack-year smoking history. She has never used smokeless tobacco. She reports that she does not drink alcohol or use drugs.   Family History:  The patient's family history includes CAD in her mother; Cancer in her mother; Heart attack in her brother, father, and sister; Heart disease in her mother; Stroke in her mother.    ROS: Noted in current history, otherwise review of systems is negative.  Physical Exam: Blood pressure 109/74, pulse 80, height 5\' 4"  (1.626 m), weight 175 lb 12.8 oz (79.7 kg), SpO2 98 %.  GEN:   Elderly female, chronicially ill appearing   HEENT: Normal NECK: No JVD; No carotid bruits LYMPHATICS: No lymphadenopathy CARDIAC: Irreg. Irreg.  RESPIRATORY:  Clear to auscultation without rales, wheezing or rhonchi  ABDOMEN: Soft, non-tender, non-distended MUSCULOSKELETAL:  No edema; No deformity  SKIN: Warm and dry NEUROLOGIC:  Alert and oriented x 3  EKG:    Recent Labs: 10/30/2016: ALT 20; BUN 17; Creatinine, Ser 1.12; Magnesium 1.7; Potassium 5.4; Sodium 141    Lipid Panel    Component Value Date/Time   CHOL 132 10/30/2016 1422   TRIG 107 10/30/2016 1422   HDL 57 10/30/2016 1422   CHOLHDL 2.3 10/30/2016 1422   LDLCALC 54 10/30/2016 1422      Wt Readings from Last 3 Encounters:  06/04/17 175 lb 12.8 oz (79.7 kg)  10/30/16 184 lb 3.2 oz (83.6 kg)  10/25/16 181 lb (82.1 kg)    Other studies Reviewed: Additional studies/ records that were reviewed today include: . Review of the above records demonstrates:   ASSESSMENT AND PLAN:  1. Coronary artery  disease: She has an occluded LAD. She status post stenting of her first diagonal.         2. Atrial Fibrillation :   CHADS2VASC is  22  ( female, age 14, DM,  HTN)  On  Eliquis 5 mg BID HR is well controlled   3. Chronic systolic congestive heart failure -  she has an occluded LAD and has an ischemic cardiopathy.  Seems to be stable , no dyspnea ,  Does not get much exercise      4. Essential hypertension -.  BP is well controlled.   5. Diabetes mellitus -  Plans per primary md   6. Leg pain :   Had ankle-brachial indexes in 2016.  We will repeat lower extremity arterial duplex study.  Current medicines are reviewed at length with the patient today.  The patient does not have concerns regarding medicines.     Disposition:   FU with  APP in 6 months    Mertie Moores, MD  06/04/2017 11:37 AM    [address], [place],   [address] Phone: [phone]; Fax: [phone]

## 2017-06-04 NOTE — Patient Instructions (Signed)
Medication Instructions:  Your physician recommends that you continue on your current medications as directed. Please refer to the Current Medication list given to you today.   Labwork: None Ordered   Testing/Procedures: Your physician has requested that you have a lower extremity arterial duplex. This test is an ultrasound of the arteries in the legs or arms. It looks at arterial blood flow in the legs and arms. Allow one hour for Lower and Upper Arterial scans. There are no restrictions or special instructions   Follow-Up: Your physician wants you to follow-up in: 6 months with a PA or Nurse Practitioner on Dr. Elmarie Shiley team. You will receive a reminder letter in the mail two months in advance. If you don't receive a letter, please call our office to schedule the follow-up appointment.   If you need a refill on your cardiac medications before your next appointment, please call your pharmacy.   Thank you for choosing [facility]! Christen Bame, RN [phone]

## 2017-06-06 ENCOUNTER — Other Ambulatory Visit: Payer: Self-pay | Admitting: Cardiovascular Disease

## 2017-06-06 DIAGNOSIS — I739 Peripheral vascular disease, unspecified: Secondary | ICD-10-CM

## 2017-06-14 ENCOUNTER — Ambulatory Visit (HOSPITAL_COMMUNITY)
Admission: RE | Admit: 2017-06-14 | Discharge: 2017-06-14 | Disposition: A | Discharge status: Home / Self Care | Payer: Medicare HMO | Source: Ambulatory Visit | Attending: Internal Medicine | Admitting: Internal Medicine

## 2017-06-14 DIAGNOSIS — I739 Peripheral vascular disease, unspecified: Secondary | ICD-10-CM | POA: Diagnosis not present

## 2017-07-29 ENCOUNTER — Telehealth: Payer: Self-pay

## 2017-07-29 NOTE — Telephone Encounter (Signed)
Dr. Rexene Alberts reviewed pt's records. Pt is being referred back from Dr. Shelia Media for tremors. Dr. Rexene Alberts asked that Dr. Shelia Media refer to pt an academic medical center for further evaluation of her tremors; Dr. Rexene Alberts has nothing further to offer this pt.   I called pt to discuss. No answer, left a message asking her to call me back.

## 2017-07-30 ENCOUNTER — Ambulatory Visit: Payer: Medicare HMO | Admitting: Neurology

## 2017-07-30 NOTE — Telephone Encounter (Signed)
I called pt again and explained Dr. Guadelupe Sabin recommendations. Pt says " I thought something funny was going on." She is agreeable to discussing with Dr. Shelia Media the need for a referral to an academic medical center/tertiary care center. Pt verbalized understanding and the appt for today was cancelled.

## 2017-07-30 NOTE — Telephone Encounter (Signed)
Dr. Guadelupe Sabin last progress note has been faxed to Dr. Pennie Banter office. Received a receipt of confirmation.

## 2017-07-30 NOTE — Telephone Encounter (Signed)
Please fax my last note and recommendations to Dr. Pennie Banter office. Nothing further needed, thanks for calling patient.

## 2017-08-12 ENCOUNTER — Other Ambulatory Visit: Payer: Self-pay | Admitting: Cardiovascular Disease

## 2017-09-23 DIAGNOSIS — N183 Chronic kidney disease, stage 3 (moderate): Secondary | ICD-10-CM | POA: Diagnosis not present

## 2017-09-23 DIAGNOSIS — E1121 Type 2 diabetes mellitus with diabetic nephropathy: Secondary | ICD-10-CM | POA: Diagnosis not present

## 2017-09-23 DIAGNOSIS — E7800 Pure hypercholesterolemia, unspecified: Secondary | ICD-10-CM | POA: Diagnosis not present

## 2017-09-23 DIAGNOSIS — I10 Essential (primary) hypertension: Secondary | ICD-10-CM | POA: Diagnosis not present

## 2017-09-23 DIAGNOSIS — E1165 Type 2 diabetes mellitus with hyperglycemia: Secondary | ICD-10-CM | POA: Diagnosis not present

## 2017-09-23 DIAGNOSIS — I2510 Atherosclerotic heart disease of native coronary artery without angina pectoris: Secondary | ICD-10-CM | POA: Diagnosis not present

## 2017-09-23 DIAGNOSIS — M1000 Idiopathic gout, unspecified site: Secondary | ICD-10-CM | POA: Diagnosis not present

## 2017-09-23 DIAGNOSIS — G250 Essential tremor: Secondary | ICD-10-CM | POA: Diagnosis not present

## 2017-09-23 DIAGNOSIS — E1142 Type 2 diabetes mellitus with diabetic polyneuropathy: Secondary | ICD-10-CM | POA: Diagnosis not present

## 2017-10-02 ENCOUNTER — Encounter (HOSPITAL_COMMUNITY): Payer: Self-pay

## 2017-10-02 ENCOUNTER — Ambulatory Visit (HOSPITAL_COMMUNITY)
Admission: EM | Admit: 2017-10-02 | Discharge: 2017-10-02 | Disposition: A | Discharge status: Home / Self Care | Payer: Medicare HMO | Attending: Family Medicine | Admitting: Family Medicine

## 2017-10-02 ENCOUNTER — Ambulatory Visit (INDEPENDENT_AMBULATORY_CARE_PROVIDER_SITE_OTHER): Payer: Medicare HMO

## 2017-10-02 DIAGNOSIS — M47816 Spondylosis without myelopathy or radiculopathy, lumbar region: Secondary | ICD-10-CM

## 2017-10-02 DIAGNOSIS — M4696 Unspecified inflammatory spondylopathy, lumbar region: Secondary | ICD-10-CM | POA: Diagnosis not present

## 2017-10-02 MED ORDER — HYDROCODONE-ACETAMINOPHEN 5-325 MG PO TABS: 1.0000 | ORAL_TABLET | Freq: Four times a day (QID) | ORAL | 0 refills | Status: DC | PRN

## 2017-10-02 NOTE — ED Provider Notes (Signed)
[facility]   [id number] 10/02/17 Arrival Time: 1853   SUBJECTIVE:  Teresa Peck is a 77 y.o. female who presents to the urgent care with complaint of sharp upper lumbar back pain, more on the right side, for the last week with bending over.  Patient denies fever, urinary tract infection symptoms, past history of similar pain, recent trauma, recent heavy lifting, or radiating nature of pain.  Patient states that the pain is intense when she gets it.  He states that her blood sugars have been relatively well controlled.  She is taking gabapentin for diabetic neuropathy.     Past Medical History:  Diagnosis Date  . Anemia   . Arthritis   . CAD (coronary artery disease)   . CAD (coronary artery disease)   . Cancer ([place])    skin cancer  . CHF (congestive heart failure) ([place])   . Coronary artery disease    MI 2004-stent  . Diabetes mellitus   . Diabetic neuropathy ([place])   . GERD (gastroesophageal reflux disease)    tums  . Gout   . Hiatal hernia   . Hyperlipidemia   . Hypertension   . PAD (peripheral artery disease) ([place])   . Tremor    Family History  Problem Relation Age of Onset  . Heart attack Father   . Heart attack Sister   . Heart attack Brother   . CAD Mother   . Stroke Mother   . Heart disease Mother   . Cancer Mother    Social History   Socioeconomic History  . Marital status: Divorced    Spouse name: Not on file  . Number of children: 2  . Years of education: 8th  . Highest education level: Not on file  Occupational History  . Occupation: N/A  Social Needs  . Financial resource strain: Not on file  . Food insecurity:    Worry: Not on file    Inability: Not on file  . Transportation needs:    Medical: Not on file    Non-medical: Not on file  Tobacco Use  . Smoking status: Former Smoker    Packs/day: 0.50    Years: 5.00    Pack years: 2.50    Types: Cigarettes    Last attempt to quit: 02/19/1978    Years since quitting: 39.6    . Smokeless tobacco: Never Used  . Tobacco comment: Quit 1982  Substance and Sexual Activity  . Alcohol use: No    Alcohol/week: 0.0 standard drinks  . Drug use: No  . Sexual activity: Not on file  Lifestyle  . Physical activity:    Days per week: Not on file    Minutes per session: Not on file  . Stress: Not on file  Relationships  . Social connections:    Talks on phone: Not on file    Gets together: Not on file    Attends religious service: Not on file    Active member of club or organization: Not on file    Attends meetings of clubs or organizations: Not on file    Relationship status: Not on file  . Intimate partner violence:    Fear of current or ex partner: Not on file    Emotionally abused: Not on file    Physically abused: Not on file    Forced sexual activity: Not on file  Other Topics Concern  . Not on file  Social History Narrative   Drinks 2 cups of coffee  a day    No outpatient medications have been marked as taking for the 10/02/17 encounter [facility] Encounter).   Allergies  Allergen Reactions  . Sulfur Rash    Rash/flushed, blood red eyes      ROS: As per HPI, remainder of ROS negative.   OBJECTIVE:   Vitals:   10/02/17 1915 10/02/17 1917 10/02/17 1926  BP:   119/89  Pulse: 81    Resp: 18    Temp: 98.8 F (37.1 C)    TempSrc: Oral    SpO2: 99%    Weight:  78.9 kg      General appearance: alert; no distress Eyes: PERRL; EOMI; conjunctiva normal HENT: normocephalic; atraumatic;  oral mucosa normal Neck: supple Lungs: clear to auscultation bilaterally Heart: regular rate and rhythm Abdomen: soft, non-tender; bowel sounds normal; no masses or organomegaly; no guarding or rebound tenderness Back: no CVA tenderness; moderate kyphotic curve to thoracic spine, nontender ribs in the posterior region of her thorax and nontender spinous processes and thoracic and lumbar regions. Extremities: no cyanosis or edema; symmetrical with no gross  deformities Skin: warm and dry; right erythematous lesion on left cheek Neurologic: normal gait; grossly normal Psychological: alert and cooperative; normal mood and affect      Labs:  Results for orders placed or performed in visit on 10/30/16  Lipid Profile  Result Value Ref Range   Cholesterol, Total 132 100 - 199 mg/dL   Triglycerides 107 0 - 149 mg/dL   HDL 57 >39 mg/dL   VLDL Cholesterol Cal 21 5 - 40 mg/dL   LDL Calculated 54 0 - 99 mg/dL   Chol/HDL Ratio 2.3 0.0 - 4.4 ratio  Magnesium  Result Value Ref Range   Magnesium 1.7 1.6 - 2.3 mg/dL  Hepatic function panel  Result Value Ref Range   Total Protein 6.6 6.0 - 8.5 g/dL   Albumin 4.3 3.5 - 4.8 g/dL   Bilirubin Total 0.5 0.0 - 1.2 mg/dL   Bilirubin, Direct 0.17 0.00 - 0.40 mg/dL   Alkaline Phosphatase 77 39 - 117 IU/L   AST 28 0 - 40 IU/L   ALT 20 0 - 32 IU/L  Basic Metabolic Panel (BMET)  Result Value Ref Range   Glucose 212 (H) 65 - 99 mg/dL   BUN 17 8 - 27 mg/dL   Creatinine, Ser 1.12 (H) 0.57 - 1.00 mg/dL   GFR calc non Af Amer 47 (L) >59 mL/min/1.73   GFR calc Af Amer 54 (L) >59 mL/min/1.73   BUN/Creatinine Ratio 15 12 - 28   Sodium 141 134 - 144 mmol/L   Potassium 5.4 (H) 3.5 - 5.2 mmol/L   Chloride 100 96 - 106 mmol/L   CO2 21 20 - 29 mmol/L   Calcium 9.6 8.7 - 10.3 mg/dL    Labs Reviewed - No data to display  No results found.     ASSESSMENT & PLAN:  1. Arthritis of lumbar spine   There appears to be quite a bit of arthritis and small compression fracture of the vertebra.  I want you to take the pain medicine as needed and follow up in the next few days with your personal physician to discuss the possible treatment options.  Meds ordered this encounter  Medications  . HYDROcodone-acetaminophen (NORCO) 5-325 MG tablet    Sig: Take 1 tablet by mouth every 6 (six) hours as needed for moderate pain.    Dispense:  12 tablet    Refill:  0  Reviewed expectations re: course of current  medical issues. Questions answered. Outlined signs and symptoms indicating need for more acute intervention. Patient verbalized understanding. After Visit Summary given.    Procedures:      Robyn Haber, MD 10/02/17 [phone]

## 2017-10-02 NOTE — Discharge Instructions (Addendum)
There appears to be quite a bit of arthritis and small compression fracture of the vertebra.  I want you to take the pain medicine as needed and follow up in the next few days with your personal physician to discuss the possible treatment options.

## 2017-10-02 NOTE — ED Triage Notes (Signed)
Back pain 1 week

## 2017-10-22 ENCOUNTER — Other Ambulatory Visit: Payer: Self-pay | Admitting: Cardiovascular Disease

## 2017-10-22 MED ORDER — METOPROLOL TARTRATE 25 MG PO TABS: 25.0000 mg | ORAL_TABLET | Freq: Two times a day (BID) | ORAL | 7 refills | Status: DC

## 2017-12-17 DIAGNOSIS — H26492 Other secondary cataract, left eye: Secondary | ICD-10-CM | POA: Diagnosis not present

## 2017-12-17 DIAGNOSIS — H02834 Dermatochalasis of left upper eyelid: Secondary | ICD-10-CM | POA: Diagnosis not present

## 2017-12-17 DIAGNOSIS — E119 Type 2 diabetes mellitus without complications: Secondary | ICD-10-CM | POA: Diagnosis not present

## 2017-12-17 DIAGNOSIS — Z961 Presence of intraocular lens: Secondary | ICD-10-CM | POA: Diagnosis not present

## 2017-12-17 DIAGNOSIS — H02831 Dermatochalasis of right upper eyelid: Secondary | ICD-10-CM | POA: Diagnosis not present

## 2017-12-26 ENCOUNTER — Ambulatory Visit: Payer: Medicare HMO | Admitting: Cardiovascular Disease

## 2018-01-21 DIAGNOSIS — Z961 Presence of intraocular lens: Secondary | ICD-10-CM | POA: Diagnosis not present

## 2018-01-21 DIAGNOSIS — H26492 Other secondary cataract, left eye: Secondary | ICD-10-CM | POA: Diagnosis not present

## 2018-01-21 DIAGNOSIS — H16223 Keratoconjunctivitis sicca, not specified as Sjogren's, bilateral: Secondary | ICD-10-CM | POA: Diagnosis not present

## 2018-01-21 DIAGNOSIS — H40013 Open angle with borderline findings, low risk, bilateral: Secondary | ICD-10-CM | POA: Diagnosis not present

## 2018-01-23 ENCOUNTER — Telehealth: Payer: Self-pay | Admitting: *Deleted

## 2018-01-23 NOTE — Telephone Encounter (Signed)
I completed provider part for patient assistance through [facility] for her [place]. I will place in Dr Lanny Hurst box for signature, he is back in office 01/27/2018. I will place the patients information in yellow folder.

## 2018-01-27 ENCOUNTER — Ambulatory Visit (HOSPITAL_COMMUNITY)
Admission: EM | Admit: 2018-01-27 | Discharge: 2018-01-27 | Disposition: A | Discharge status: Home / Self Care | Payer: Medicare HMO | Attending: Family Medicine | Admitting: Family Medicine

## 2018-01-27 ENCOUNTER — Other Ambulatory Visit: Payer: Self-pay

## 2018-01-27 ENCOUNTER — Encounter (HOSPITAL_COMMUNITY): Payer: Self-pay

## 2018-01-27 DIAGNOSIS — M25562 Pain in left knee: Secondary | ICD-10-CM | POA: Diagnosis not present

## 2018-01-27 DIAGNOSIS — M1712 Unilateral primary osteoarthritis, left knee: Secondary | ICD-10-CM | POA: Diagnosis not present

## 2018-01-27 NOTE — Telephone Encounter (Signed)
I called the pt and she confirmed that this [employer] Pt Asst application for Eliquis is for the year 2020.  I have advised her to watch her mail as [employer] my reach out to her if they need more info from her. She verbalized understanding.  I have faxed the pts BMS pt asst application to [employer].

## 2018-01-27 NOTE — ED Triage Notes (Signed)
Pt cc she states that when she tries to bend her left knee she feels something pulling. X 3 days.

## 2018-01-27 NOTE — Discharge Instructions (Addendum)
Cortisone injection given in office Rest, ice, and elevate Limit painful activities Continue with OTC tylenol as instructed  Follow up with orthopedist for further evaluation and management of knee pain Return or go to the ED if you have any new or worsening symptoms such as worsening pain, swelling, redness, fever, chills, nausea, vomiting, etc..Marland KitchenMarland Kitchen

## 2018-01-27 NOTE — ED Provider Notes (Signed)
[place]   [id number] 01/27/18 Arrival Time: 23  CC: Left knee pain  SUBJECTIVE: History from: patient. Teresa Peck is a 77 y.o. female hx significant for CHF, HLD, CAD, DM type 2, complains of left knee pain that began 3 days ago.  Denies a precipitating event or specific injury.  Localizes the pain to the inside of left knee.  Describes the pain as constant and "pulling" in character.  Has tried OTC medications without relief.  Symptoms are made worse with knee ROM and weight-bearing.  Denies similar symptoms in the past.  Complains of associated knee swelling.  Denies fever, chills, erythema, ecchymosis, weakness, numbness and tingling.      Patient requests cortisone injection.    DM well controlled.  Blood glucose158 this AM.    ROS: As per HPI.  Past Medical History:  Diagnosis Date  . Anemia   . Arthritis   . CAD (coronary artery disease)   . CAD (coronary artery disease)   . Cancer ([place])    skin cancer  . CHF (congestive heart failure) ([place])   . Coronary artery disease    MI 2004-stent  . Diabetes mellitus   . Diabetic neuropathy ([place])   . GERD (gastroesophageal reflux disease)    tums  . Gout   . Hiatal hernia   . Hyperlipidemia   . Hypertension   . PAD (peripheral artery disease) ([place])   . Tremor    Past Surgical History:  Procedure Laterality Date  . BILATERAL CORNEA IMPLANTS  2010  . CARDIAC CATHETERIZATION     stent post MI   . CHOLECYSTECTOMY    . CORONARY ANGIOPLASTY     2004  stent  . EYE SURGERY    . [place]   right  . LUMBAR LAMINECTOMY/DECOMPRESSION MICRODISCECTOMY N/A 04/13/2015   Procedure: L4-5 Decompression;  Surgeon: Marybelle Killings, MD;  Location: [facility];  Service: Orthopedics;  Laterality: N/A;  Need RNFA   Allergies  Allergen Reactions  . Sulfur Rash    Rash/flushed, blood red eyes   No current facility-administered medications on file prior to encounter.    Current Outpatient Medications on File Prior  to Encounter  Medication Sig Dispense Refill  . allopurinol (ZYLOPRIM) 300 MG tablet Take 300 mg by mouth daily.    Marland Kitchen apixaban (ELIQUIS) 5 MG TABS tablet Take 1 tablet (5 mg total) by mouth 2 (two) times daily. 180 tablet 3  . furosemide (LASIX) 20 MG tablet Take 20 mg by mouth daily.    Marland Kitchen gabapentin (NEURONTIN) 600 MG tablet Take 600 mg by mouth 2 (two) times daily.      Marland Kitchen HYDROcodone-acetaminophen (NORCO) 5-325 MG tablet Take 1 tablet by mouth every 6 (six) hours as needed for moderate pain. 12 tablet 0  . insulin glargine (LANTUS) 100 unit/mL SOPN Inject 8 Units into the skin 2 (two) times daily.     . Magnesium Oxide 400 (240 Mg) MG TABS Take 1 tablet (400 mg total) by mouth daily. 30 tablet   . metFORMIN (GLUCOPHAGE) 500 MG tablet Take 500 mg by mouth 2 (two) times daily with a meal.    . metoprolol tartrate (LOPRESSOR) 25 MG tablet Take 1 tablet (25 mg total) by mouth 2 (two) times daily. 60 tablet 7  . nitroGLYCERIN (NITROSTAT) 0.4 MG SL tablet PLACE 1 TABLET UNDER THE TONGUE EVERY 5 MINUTES AS NEEDED FOR CHEST PAIN 25 tablet 6  . RaNITidine HCl (ZANTAC PO) Take 1 tablet  by mouth daily.    . rosuvastatin (CRESTOR) 10 MG tablet Take 10 mg by mouth daily.     Social History   Socioeconomic History  . Marital status: Divorced    Spouse name: Not on file  . Number of children: 2  . Years of education: 8th  . Highest education level: Not on file  Occupational History  . Occupation: N/A  Social Needs  . Financial resource strain: Not on file  . Food insecurity:    Worry: Not on file    Inability: Not on file  . Transportation needs:    Medical: Not on file    Non-medical: Not on file  Tobacco Use  . Smoking status: Former Smoker    Packs/day: 0.50    Years: 5.00    Pack years: 2.50    Types: Cigarettes    Last attempt to quit: 02/19/1978    Years since quitting: 39.9  . Smokeless tobacco: Never Used  . Tobacco comment: Quit 1982  Substance and Sexual Activity  . Alcohol  use: No    Alcohol/week: 0.0 standard drinks  . Drug use: No  . Sexual activity: Not on file  Lifestyle  . Physical activity:    Days per week: Not on file    Minutes per session: Not on file  . Stress: Not on file  Relationships  . Social connections:    Talks on phone: Not on file    Gets together: Not on file    Attends religious service: Not on file    Active member of club or organization: Not on file    Attends meetings of clubs or organizations: Not on file    Relationship status: Not on file  . Intimate partner violence:    Fear of current or ex partner: Not on file    Emotionally abused: Not on file    Physically abused: Not on file    Forced sexual activity: Not on file  Other Topics Concern  . Not on file  Social History Narrative   Drinks 2 cups of coffee a day    Family History  Problem Relation Age of Onset  . Heart attack Father   . Heart attack Sister   . Heart attack Brother   . CAD Mother   . Stroke Mother   . Heart disease Mother   . Cancer Mother     OBJECTIVE:  Vitals:   01/27/18 1700 01/27/18 1702 01/27/18 1703  BP:   100/67  Pulse: 95    Resp: 18    Temp: 98.1 F (36.7 C)    SpO2: 99%    Weight:  174 lb (78.9 kg)     General appearance: Alert3; in no acute distress.  Head: NCAT Lungs: CTA bilaterally Heart: RRR.  Clear S1 and S2 without murmur, gallops, or rubs.  Radial pulses 2+ bilaterally. Musculoskeletal: Left knee Inspection: Skin warm, dry, clear and intact.  Mild swelling over the superior medial aspect of the knee Palpation: Tender to palpation over the medial aspect ROM: FROM active and passive Strength: 5/5 knee abduction, 5/5 knee adduction, 5/5 knee flexion, 5/5 knee extension, 5/5 dorsiflexion, 5/5 plantar flexion Skin: warm and dry Neurologic: Ambulates with minimal difficulty; Sensation intact about the lower extremities Psychological: alert and cooperative; normal mood and affect  PROCEDURE  Consent obtained.   Procedure performed by Audery Amel PA-C.  Knee anesthetized with 2-3 cc of lidocaine prior to injection.  Cleansed with betadine.  Using sterile gloves,  4 cc of lidocaine mixed with 1 cc of depo 60 mg administered to inferior medial aspect of anterior left knee.  Pt tolerated procedure well.    ASSESSMENT & PLAN:  1. Acute pain of left knee   2. Osteoarthritis of left knee, unspecified osteoarthritis type     No orders of the defined types were placed in this encounter.  Cortisone injection given in office Rest, ice, and elevate Limit painful activities Continue with OTC tylenol as instructed  Follow up with orthopedist for further evaluation and management of knee pain Return or go to the ED if you have any new or worsening symptoms such as worsening pain, swelling, redness, fever, chills, nausea, vomiting, etc....  Reviewed expectations re: course of current medical issues. Questions answered. Outlined signs and symptoms indicating need for more acute intervention. Patient verbalized understanding. After Visit Summary given.    Lestine Box, PA-C 01/27/18 2118

## 2018-02-25 DIAGNOSIS — H16223 Keratoconjunctivitis sicca, not specified as Sjogren's, bilateral: Secondary | ICD-10-CM | POA: Diagnosis not present

## 2018-02-25 DIAGNOSIS — Z961 Presence of intraocular lens: Secondary | ICD-10-CM | POA: Diagnosis not present

## 2018-02-25 DIAGNOSIS — H40013 Open angle with borderline findings, low risk, bilateral: Secondary | ICD-10-CM | POA: Diagnosis not present

## 2018-02-25 DIAGNOSIS — H26492 Other secondary cataract, left eye: Secondary | ICD-10-CM | POA: Diagnosis not present

## 2018-02-26 ENCOUNTER — Ambulatory Visit: Payer: Medicare HMO | Admitting: Cardiovascular Disease

## 2018-02-26 ENCOUNTER — Encounter: Payer: Self-pay | Admitting: Cardiovascular Disease

## 2018-02-26 VITALS — BP 120/72 | HR 55 | Ht 64.0 in | Wt 174.0 lb

## 2018-02-26 DIAGNOSIS — I5022 Chronic systolic (congestive) heart failure: Secondary | ICD-10-CM

## 2018-02-26 DIAGNOSIS — I2583 Coronary atherosclerosis due to lipid rich plaque: Secondary | ICD-10-CM | POA: Diagnosis not present

## 2018-02-26 DIAGNOSIS — E7849 Other hyperlipidemia: Secondary | ICD-10-CM

## 2018-02-26 DIAGNOSIS — I2510 Atherosclerotic heart disease of native coronary artery without angina pectoris: Secondary | ICD-10-CM | POA: Diagnosis not present

## 2018-02-26 NOTE — Progress Notes (Signed)
Cardiology Office Note   Date:  02/26/2018   ID:  Teresa Peck, DOB [birth year], MRN [id number]  PCP:  Deland Pretty, MD  Cardiologist:   Mertie Moores, MD   Chief Complaint  Patient presents with  . Atrial Fibrillation   Problem list: 1. Coronary artery disease: She has an occluded LAD. She status post stenting of her first diagonal 2. Hyperlipidemia 3. Chronic systolic congestive heart failure 4. Essential hypertension 5. 5. Diabetes mellitus - peripheral neuropathy      Teresa Peck is a 78 y.o. female who presents for follow-up of her coronary artery disease. She's been seen in the past by Dr. Rockey Situ. She was seen with her Daughter, Teresa Peck.  She had a cath in 2004 and had stenting of her 1st diag. Dr. Minna Antis has stopped some of her meds ( Coreg) also stopped allopurinol and Digoxin.   She denies any cp.  Breathing is ok.   Eats lots of Progresso soup.  She does not exercise.   Knows that she needs to walk more.  Has claudication with ambulation .   Aug. 30, 2016:   Doing well.  Complains of leg pain - was told that she had diabetic neuropathy Lots of leg swelling ,  Still eating lots of soup and canned foods.   Dec. 2 , 2016:  Breathing is good.  Leg Swelling is better  She thinks the Actos is causing fluid retention and weight gain .   May 17, 2015:  Has had her back surgery  ,.   Legs and back feel much better.  She was hypotensive prior to her back surgery and we held her coreg and Quinipril   Sept. 19, 2017:  Teresa Peck is seen back for a follow up visit. Has cut out her salt. Has lots 8 lbs.  Breathing is ok   Cannot tell that her HR is irregular.   Sept. 11, 2018:  Seen today for follow  Breathing is going well.   June 04, 2017:   Teresa Peck is seen today for follow-up of her coronary artery disease, chronic systolic congestive heart failure, atrial fibrillation.  She also has a history of hyperlipidemia.  Has bilateral leg  pain when she walks   February 26, 2018  Seen for follow  Up  Still has frequent episodes of leg pain, she admits that she does not walk enough. Her breathing seems to be okay.  She is not having any episodes of chest pain.  Past Medical History:  Diagnosis Date  . Anemia   . Arthritis   . CAD (coronary artery disease)   . CAD (coronary artery disease)   . Cancer ([place])    skin cancer  . CHF (congestive heart failure) ([place])   . Coronary artery disease    MI 2004-stent  . Diabetes mellitus   . Diabetic neuropathy ([place])   . GERD (gastroesophageal reflux disease)    tums  . Gout   . Hiatal hernia   . Hyperlipidemia   . Hypertension   . PAD (peripheral artery disease) ([place])   . Tremor     Past Surgical History:  Procedure Laterality Date  . BILATERAL CORNEA IMPLANTS  2010  . CARDIAC CATHETERIZATION     stent post MI   . CHOLECYSTECTOMY    . CORONARY ANGIOPLASTY     2004  stent  . EYE SURGERY    . [place]   right  . LUMBAR LAMINECTOMY/DECOMPRESSION MICRODISCECTOMY N/A 04/13/2015  Procedure: L4-5 Decompression;  Surgeon: Marybelle Killings, MD;  Location: [facility];  Service: Orthopedics;  Laterality: N/A;  Need RNFA     Current Outpatient Medications  Medication Sig Dispense Refill  . allopurinol (ZYLOPRIM) 300 MG tablet Take 300 mg by mouth daily.    Marland Kitchen apixaban (ELIQUIS) 5 MG TABS tablet Take 1 tablet (5 mg total) by mouth 2 (two) times daily. 180 tablet 3  . furosemide (LASIX) 20 MG tablet Take 20 mg by mouth daily.    Marland Kitchen gabapentin (NEURONTIN) 600 MG tablet Take 600 mg by mouth 2 (two) times daily.      . insulin glargine (LANTUS) 100 unit/mL SOPN Inject 8 Units into the skin 2 (two) times daily.     . Magnesium Oxide 400 (240 Mg) MG TABS Take 1 tablet (400 mg total) by mouth daily. 30 tablet   . metFORMIN (GLUCOPHAGE) 500 MG tablet Take 500 mg by mouth 2 (two) times daily with a meal.    . metoprolol tartrate (LOPRESSOR) 25 MG tablet Take 1 tablet (25 mg total) by  mouth 2 (two) times daily. 60 tablet 7  . nitroGLYCERIN (NITROSTAT) 0.4 MG SL tablet PLACE 1 TABLET UNDER THE TONGUE EVERY 5 MINUTES AS NEEDED FOR CHEST PAIN 25 tablet 6  . RaNITidine HCl (ZANTAC PO) Take 1 tablet by mouth daily.    . rosuvastatin (CRESTOR) 10 MG tablet Take 10 mg by mouth daily.     No current facility-administered medications for this visit.     Allergies:   Sulfur    Social History:  The patient  reports that she quit smoking about 40 years ago. Her smoking use included cigarettes. She has a 2.50 pack-year smoking history. She has never used smokeless tobacco. She reports that she does not drink alcohol or use drugs.   Family History:  The patient's family history includes CAD in her mother; Cancer in her mother; Heart attack in her brother, father, and sister; Heart disease in her mother; Stroke in her mother.    ROS: Noted in current history, otherwise review of systems is negative.  Physical Exam: Blood pressure 120/72, pulse (!) 55, height 5\' 4"  (1.626 m), weight 174 lb (78.9 kg), SpO2 (!) 87 %.  GEN: Elderly, chronically ill-appearing female, no acute distress HEENT: Normal NECK: No JVD; No carotid bruits LYMPHATICS: No lymphadenopathy CARDIAC: Irregularly irregular, soft systolic murmur RESPIRATORY:  Clear to auscultation without rales, wheezing or rhonchi  ABDOMEN: Soft, non-tender, non-distended MUSCULOSKELETAL:  No edema; No deformity  SKIN: Warm and dry NEUROLOGIC:  Alert and oriented x 3  EKG:    February 26, 2018: Atrial fibrillation with a heart rate of 81.  Left anterior fascicular block.  Previous septal myocardial infarction  Recent Labs: No results found for requested labs within last 8760 hours.    Lipid Panel    Component Value Date/Time   CHOL 132 10/30/2016 1422   TRIG 107 10/30/2016 1422   HDL 57 10/30/2016 1422   CHOLHDL 2.3 10/30/2016 1422   LDLCALC 54 10/30/2016 1422      Wt Readings from Last 3 Encounters:  02/26/18 174 lb  (78.9 kg)  01/27/18 174 lb (78.9 kg)  10/02/17 174 lb (78.9 kg)    Other studies Reviewed: Additional studies/ records that were reviewed today include: . Review of the above records demonstrates:   ASSESSMENT AND PLAN:  1. Coronary artery disease: She has an occluded LAD. She status post stenting of her first diagonal.  Not had any episodes of angina.  She does not really get much exercise.  Lipid levels look great.  2. Atrial Fibrillation :   CHADS2VASC is  58  ( female, age 69, DM,  HTN)  Tinea with Eliquis.  She is basically asymptomatic.  We discussed the natural course of atrial fibrillation. Point we will continue with the rate control and anticoagulation strategy  3. Chronic systolic congestive heart failure -not really having any symptoms of congestive heart failure.  Continue current medications  4. Essential hypertension -.  Pressure seems to be fairly well controlled.  To continue to watch her salt.  5. Diabetes mellitus -   6. Leg pain :   Current medicines are reviewed at length with the patient today.  The patient does not have concerns regarding medicines.     Disposition:   FU with  Me in  6 month    Mertie Moores, MD  02/26/2018 4:18 PM    [facility] [place], [place], [place]  [address] Phone: [phone]; Fax: [phone]

## 2018-02-26 NOTE — Patient Instructions (Addendum)
Your physician recommends that you continue on your current medications as directed. Please refer to the Current Medication list given to you today.   Your physician recommends that you return for lab work in:  [place] wants you to follow-up in: [place] will receive a reminder letter in the mail two months in advance. If you don't receive a letter, please call our office to schedule the follow-up appointment.

## 2018-02-27 LAB — HEPATIC FUNCTION PANEL
ALBUMIN: 4 g/dL (ref 3.5–4.8)
ALK PHOS: 57 IU/L (ref 39–117)
ALT: 19 IU/L (ref 0–32)
AST: 26 IU/L (ref 0–40)
BILIRUBIN, DIRECT: 0.17 mg/dL (ref 0.00–0.40)
Bilirubin Total: 0.5 mg/dL (ref 0.0–1.2)
Total Protein: 6.3 g/dL (ref 6.0–8.5)

## 2018-02-27 LAB — BASIC METABOLIC PANEL
BUN / CREAT RATIO: 17 (ref 12–28)
BUN: 22 mg/dL (ref 8–27)
CHLORIDE: 103 mmol/L (ref 96–106)
CO2: 26 mmol/L (ref 20–29)
Calcium: 9.6 mg/dL (ref 8.7–10.3)
Creatinine, Ser: 1.31 mg/dL — ABNORMAL HIGH (ref 0.57–1.00)
GFR calc Af Amer: 45 mL/min/{1.73_m2} — ABNORMAL LOW (ref >59)
GFR calc non Af Amer: 39 mL/min/{1.73_m2} — ABNORMAL LOW (ref >59)
Glucose: 121 mg/dL — ABNORMAL HIGH (ref 65–99)
POTASSIUM: 4.8 mmol/L (ref 3.5–5.2)
Sodium: 144 mmol/L (ref 134–144)

## 2018-02-27 LAB — LIPID PANEL
Chol/HDL Ratio: 2.2 ratio (ref 0.0–4.4)
Cholesterol, Total: 115 mg/dL (ref 100–199)
HDL: 52 mg/dL (ref >39)
LDL Calculated: 41 mg/dL (ref 0–99)
Triglycerides: 112 mg/dL (ref 0–149)
VLDL CHOLESTEROL CAL: 22 mg/dL (ref 5–40)

## 2018-03-03 DIAGNOSIS — H04123 Dry eye syndrome of bilateral lacrimal glands: Secondary | ICD-10-CM | POA: Diagnosis not present

## 2018-03-03 DIAGNOSIS — H16101 Unspecified superficial keratitis, right eye: Secondary | ICD-10-CM | POA: Diagnosis not present

## 2018-03-31 DIAGNOSIS — E7800 Pure hypercholesterolemia, unspecified: Secondary | ICD-10-CM | POA: Diagnosis not present

## 2018-03-31 DIAGNOSIS — E1165 Type 2 diabetes mellitus with hyperglycemia: Secondary | ICD-10-CM | POA: Diagnosis not present

## 2018-03-31 DIAGNOSIS — I10 Essential (primary) hypertension: Secondary | ICD-10-CM | POA: Diagnosis not present

## 2018-04-07 DIAGNOSIS — R5383 Other fatigue: Secondary | ICD-10-CM | POA: Diagnosis not present

## 2018-04-07 DIAGNOSIS — N183 Chronic kidney disease, stage 3 (moderate): Secondary | ICD-10-CM | POA: Diagnosis not present

## 2018-04-07 DIAGNOSIS — E1121 Type 2 diabetes mellitus with diabetic nephropathy: Secondary | ICD-10-CM | POA: Diagnosis not present

## 2018-04-07 DIAGNOSIS — Z0000 Encounter for general adult medical examination without abnormal findings: Secondary | ICD-10-CM | POA: Diagnosis not present

## 2018-04-07 DIAGNOSIS — I10 Essential (primary) hypertension: Secondary | ICD-10-CM | POA: Diagnosis not present

## 2018-04-07 DIAGNOSIS — R600 Localized edema: Secondary | ICD-10-CM | POA: Diagnosis not present

## 2018-04-07 DIAGNOSIS — I2510 Atherosclerotic heart disease of native coronary artery without angina pectoris: Secondary | ICD-10-CM | POA: Diagnosis not present

## 2018-04-07 DIAGNOSIS — G250 Essential tremor: Secondary | ICD-10-CM | POA: Diagnosis not present

## 2018-04-07 DIAGNOSIS — E7800 Pure hypercholesterolemia, unspecified: Secondary | ICD-10-CM | POA: Diagnosis not present

## 2018-04-07 DIAGNOSIS — E875 Hyperkalemia: Secondary | ICD-10-CM | POA: Diagnosis not present

## 2018-04-07 DIAGNOSIS — E1142 Type 2 diabetes mellitus with diabetic polyneuropathy: Secondary | ICD-10-CM | POA: Diagnosis not present

## 2018-04-21 DIAGNOSIS — H169 Unspecified keratitis: Secondary | ICD-10-CM | POA: Diagnosis not present

## 2018-04-21 DIAGNOSIS — H04123 Dry eye syndrome of bilateral lacrimal glands: Secondary | ICD-10-CM | POA: Diagnosis not present

## 2018-04-21 DIAGNOSIS — H26492 Other secondary cataract, left eye: Secondary | ICD-10-CM | POA: Diagnosis not present

## 2018-04-21 DIAGNOSIS — H16421 Pannus (corneal), right eye: Secondary | ICD-10-CM | POA: Diagnosis not present

## 2018-04-21 DIAGNOSIS — Z961 Presence of intraocular lens: Secondary | ICD-10-CM | POA: Diagnosis not present

## 2018-04-25 DIAGNOSIS — I2510 Atherosclerotic heart disease of native coronary artery without angina pectoris: Secondary | ICD-10-CM | POA: Diagnosis not present

## 2018-04-25 DIAGNOSIS — I5022 Chronic systolic (congestive) heart failure: Secondary | ICD-10-CM | POA: Diagnosis not present

## 2018-04-25 DIAGNOSIS — E1165 Type 2 diabetes mellitus with hyperglycemia: Secondary | ICD-10-CM | POA: Diagnosis not present

## 2018-04-25 DIAGNOSIS — M8589 Other specified disorders of bone density and structure, multiple sites: Secondary | ICD-10-CM | POA: Diagnosis not present

## 2018-04-25 DIAGNOSIS — I4891 Unspecified atrial fibrillation: Secondary | ICD-10-CM | POA: Diagnosis not present

## 2018-04-25 DIAGNOSIS — E875 Hyperkalemia: Secondary | ICD-10-CM | POA: Diagnosis not present

## 2018-04-25 DIAGNOSIS — M810 Age-related osteoporosis without current pathological fracture: Secondary | ICD-10-CM | POA: Diagnosis not present

## 2018-04-25 DIAGNOSIS — E7800 Pure hypercholesterolemia, unspecified: Secondary | ICD-10-CM | POA: Diagnosis not present

## 2018-05-13 NOTE — Telephone Encounter (Signed)
**Note De-Identified Felisia Balcom Obfuscation** Letter received from [employer] stating that they have denied pt assistance to the pt for her Eliquis. Reason: Product covered by insurance  The letter states that they have notified the pt.

## 2018-05-16 DIAGNOSIS — I5022 Chronic systolic (congestive) heart failure: Secondary | ICD-10-CM | POA: Diagnosis not present

## 2018-05-16 DIAGNOSIS — I4891 Unspecified atrial fibrillation: Secondary | ICD-10-CM | POA: Diagnosis not present

## 2018-05-16 DIAGNOSIS — E1165 Type 2 diabetes mellitus with hyperglycemia: Secondary | ICD-10-CM | POA: Diagnosis not present

## 2018-05-16 DIAGNOSIS — M810 Age-related osteoporosis without current pathological fracture: Secondary | ICD-10-CM | POA: Diagnosis not present

## 2018-05-19 ENCOUNTER — Telehealth: Payer: Self-pay | Admitting: Cardiovascular Disease

## 2018-05-19 MED ORDER — APIXABAN 5 MG PO TABS: 5.0000 mg | ORAL_TABLET | Freq: Two times a day (BID) | ORAL | 5 refills | Status: DC

## 2018-05-19 NOTE — Telephone Encounter (Signed)
Age 78, weight 79kg, SCr 1.31 on 02/26/18. Refill sent in. Returned pt's call, she stated her PCP Dr Shelia Media decreased her metoprolol to once daily. Updated medication list to reflect metoprolol succinate 25mg  once daily (previously been filling metoprolol tartrate 25mg  BID) - will forward to Dr Acie Fredrickson as an Juluis Rainier and for any other input. HR was a bit low at 55 at last OV in January, pt reports BP and HR are currently stable.

## 2018-05-19 NOTE — Telephone Encounter (Signed)
I agree with Dr. Pennie Banter plan to reduce metoprolol to 25 mg daily

## 2018-05-19 NOTE — Telephone Encounter (Signed)
°*  STAT* If patient is at the pharmacy, call can be transferred to refill team.   1. Which medications need to be refilled? (please list name of each medication and dose if known) Eliquis 5 mg  2. Which pharmacy/location (including street and city if local pharmacy) is medication to be sent to? [facility] [place]  3. Do they need a 30 day or 90 day supply? 30  Patient would like for some one to call her.

## 2018-05-26 ENCOUNTER — Telehealth: Payer: Self-pay

## 2018-05-26 NOTE — Telephone Encounter (Signed)
Received notification from [employer] that Pt is now eligible to receive Eliquis free of charge from 05/23/2018 throught 02/19/2019.

## 2018-07-31 DIAGNOSIS — H169 Unspecified keratitis: Secondary | ICD-10-CM | POA: Diagnosis not present

## 2018-08-13 DIAGNOSIS — I10 Essential (primary) hypertension: Secondary | ICD-10-CM | POA: Diagnosis not present

## 2018-08-13 DIAGNOSIS — E7800 Pure hypercholesterolemia, unspecified: Secondary | ICD-10-CM | POA: Diagnosis not present

## 2018-08-13 DIAGNOSIS — E1165 Type 2 diabetes mellitus with hyperglycemia: Secondary | ICD-10-CM | POA: Diagnosis not present

## 2018-08-18 DIAGNOSIS — E1121 Type 2 diabetes mellitus with diabetic nephropathy: Secondary | ICD-10-CM | POA: Diagnosis not present

## 2018-08-18 DIAGNOSIS — I4891 Unspecified atrial fibrillation: Secondary | ICD-10-CM | POA: Diagnosis not present

## 2018-08-18 DIAGNOSIS — N183 Chronic kidney disease, stage 3 (moderate): Secondary | ICD-10-CM | POA: Diagnosis not present

## 2018-08-18 DIAGNOSIS — I2510 Atherosclerotic heart disease of native coronary artery without angina pectoris: Secondary | ICD-10-CM | POA: Diagnosis not present

## 2018-08-18 DIAGNOSIS — I10 Essential (primary) hypertension: Secondary | ICD-10-CM | POA: Diagnosis not present

## 2018-08-18 DIAGNOSIS — M1000 Idiopathic gout, unspecified site: Secondary | ICD-10-CM | POA: Diagnosis not present

## 2018-08-18 DIAGNOSIS — E7800 Pure hypercholesterolemia, unspecified: Secondary | ICD-10-CM | POA: Diagnosis not present

## 2018-08-31 ENCOUNTER — Emergency Department (HOSPITAL_COMMUNITY): Payer: Medicare HMO

## 2018-08-31 ENCOUNTER — Inpatient Hospital Stay (HOSPITAL_COMMUNITY)
Admission: EM | Admit: 2018-08-31 | Discharge: 2018-09-05 | DRG: 637 | Disposition: A | Discharge status: Home Health | Payer: Medicare HMO | Attending: Internal Medicine | Admitting: Internal Medicine

## 2018-08-31 ENCOUNTER — Other Ambulatory Visit: Payer: Self-pay

## 2018-08-31 ENCOUNTER — Encounter (HOSPITAL_COMMUNITY): Payer: Self-pay | Admitting: *Deleted

## 2018-08-31 DIAGNOSIS — I482 Chronic atrial fibrillation, unspecified: Secondary | ICD-10-CM | POA: Diagnosis present

## 2018-08-31 DIAGNOSIS — Z7901 Long term (current) use of anticoagulants: Secondary | ICD-10-CM

## 2018-08-31 DIAGNOSIS — S8991XA Unspecified injury of right lower leg, initial encounter: Secondary | ICD-10-CM | POA: Diagnosis not present

## 2018-08-31 DIAGNOSIS — W19XXXA Unspecified fall, initial encounter: Secondary | ICD-10-CM

## 2018-08-31 DIAGNOSIS — Z85828 Personal history of other malignant neoplasm of skin: Secondary | ICD-10-CM

## 2018-08-31 DIAGNOSIS — K7200 Acute and subacute hepatic failure without coma: Secondary | ICD-10-CM | POA: Diagnosis not present

## 2018-08-31 DIAGNOSIS — I2583 Coronary atherosclerosis due to lipid rich plaque: Secondary | ICD-10-CM | POA: Diagnosis not present

## 2018-08-31 DIAGNOSIS — R000 Tachycardia, unspecified: Secondary | ICD-10-CM | POA: Diagnosis not present

## 2018-08-31 DIAGNOSIS — Z955 Presence of coronary angioplasty implant and graft: Secondary | ICD-10-CM

## 2018-08-31 DIAGNOSIS — M25561 Pain in right knee: Secondary | ICD-10-CM | POA: Diagnosis not present

## 2018-08-31 DIAGNOSIS — R945 Abnormal results of liver function studies: Secondary | ICD-10-CM | POA: Diagnosis not present

## 2018-08-31 DIAGNOSIS — Z20828 Contact with and (suspected) exposure to other viral communicable diseases: Secondary | ICD-10-CM | POA: Diagnosis not present

## 2018-08-31 DIAGNOSIS — G9341 Metabolic encephalopathy: Secondary | ICD-10-CM | POA: Diagnosis not present

## 2018-08-31 DIAGNOSIS — Z8249 Family history of ischemic heart disease and other diseases of the circulatory system: Secondary | ICD-10-CM

## 2018-08-31 DIAGNOSIS — E1140 Type 2 diabetes mellitus with diabetic neuropathy, unspecified: Secondary | ICD-10-CM | POA: Diagnosis present

## 2018-08-31 DIAGNOSIS — J9690 Respiratory failure, unspecified, unspecified whether with hypoxia or hypercapnia: Secondary | ICD-10-CM

## 2018-08-31 DIAGNOSIS — S0003XA Contusion of scalp, initial encounter: Secondary | ICD-10-CM | POA: Diagnosis not present

## 2018-08-31 DIAGNOSIS — W010XXA Fall on same level from slipping, tripping and stumbling without subsequent striking against object, initial encounter: Secondary | ICD-10-CM | POA: Diagnosis present

## 2018-08-31 DIAGNOSIS — I252 Old myocardial infarction: Secondary | ICD-10-CM

## 2018-08-31 DIAGNOSIS — Z79899 Other long term (current) drug therapy: Secondary | ICD-10-CM

## 2018-08-31 DIAGNOSIS — E875 Hyperkalemia: Secondary | ICD-10-CM | POA: Diagnosis not present

## 2018-08-31 DIAGNOSIS — Z794 Long term (current) use of insulin: Secondary | ICD-10-CM

## 2018-08-31 DIAGNOSIS — E1111 Type 2 diabetes mellitus with ketoacidosis with coma: Secondary | ICD-10-CM | POA: Diagnosis not present

## 2018-08-31 DIAGNOSIS — M25531 Pain in right wrist: Secondary | ICD-10-CM | POA: Diagnosis not present

## 2018-08-31 DIAGNOSIS — K219 Gastro-esophageal reflux disease without esophagitis: Secondary | ICD-10-CM | POA: Diagnosis present

## 2018-08-31 DIAGNOSIS — E860 Dehydration: Secondary | ICD-10-CM | POA: Diagnosis present

## 2018-08-31 DIAGNOSIS — R7989 Other specified abnormal findings of blood chemistry: Secondary | ICD-10-CM

## 2018-08-31 DIAGNOSIS — I248 Other forms of acute ischemic heart disease: Secondary | ICD-10-CM | POA: Diagnosis present

## 2018-08-31 DIAGNOSIS — M160 Bilateral primary osteoarthritis of hip: Secondary | ICD-10-CM | POA: Diagnosis not present

## 2018-08-31 DIAGNOSIS — Z1159 Encounter for screening for other viral diseases: Secondary | ICD-10-CM

## 2018-08-31 DIAGNOSIS — R339 Retention of urine, unspecified: Secondary | ICD-10-CM | POA: Diagnosis present

## 2018-08-31 DIAGNOSIS — I2510 Atherosclerotic heart disease of native coronary artery without angina pectoris: Secondary | ICD-10-CM | POA: Diagnosis not present

## 2018-08-31 DIAGNOSIS — E785 Hyperlipidemia, unspecified: Secondary | ICD-10-CM | POA: Diagnosis present

## 2018-08-31 DIAGNOSIS — E1165 Type 2 diabetes mellitus with hyperglycemia: Secondary | ICD-10-CM | POA: Diagnosis not present

## 2018-08-31 DIAGNOSIS — R0902 Hypoxemia: Secondary | ICD-10-CM | POA: Diagnosis not present

## 2018-08-31 DIAGNOSIS — T40605A Adverse effect of unspecified narcotics, initial encounter: Secondary | ICD-10-CM | POA: Diagnosis not present

## 2018-08-31 DIAGNOSIS — Z6829 Body mass index (BMI) 29.0-29.9, adult: Secondary | ICD-10-CM

## 2018-08-31 DIAGNOSIS — T886XXA Anaphylactic reaction due to adverse effect of correct drug or medicament properly administered, initial encounter: Secondary | ICD-10-CM | POA: Diagnosis not present

## 2018-08-31 DIAGNOSIS — I4891 Unspecified atrial fibrillation: Secondary | ICD-10-CM | POA: Diagnosis present

## 2018-08-31 DIAGNOSIS — I5022 Chronic systolic (congestive) heart failure: Secondary | ICD-10-CM | POA: Diagnosis present

## 2018-08-31 DIAGNOSIS — R079 Chest pain, unspecified: Secondary | ICD-10-CM | POA: Diagnosis not present

## 2018-08-31 DIAGNOSIS — Z823 Family history of stroke: Secondary | ICD-10-CM

## 2018-08-31 DIAGNOSIS — N139 Obstructive and reflux uropathy, unspecified: Secondary | ICD-10-CM | POA: Diagnosis present

## 2018-08-31 DIAGNOSIS — M6282 Rhabdomyolysis: Secondary | ICD-10-CM | POA: Diagnosis not present

## 2018-08-31 DIAGNOSIS — E1151 Type 2 diabetes mellitus with diabetic peripheral angiopathy without gangrene: Secondary | ICD-10-CM | POA: Diagnosis present

## 2018-08-31 DIAGNOSIS — I110 Hypertensive heart disease with heart failure: Secondary | ICD-10-CM | POA: Diagnosis present

## 2018-08-31 DIAGNOSIS — K760 Fatty (change of) liver, not elsewhere classified: Secondary | ICD-10-CM | POA: Diagnosis present

## 2018-08-31 DIAGNOSIS — I10 Essential (primary) hypertension: Secondary | ICD-10-CM | POA: Diagnosis not present

## 2018-08-31 DIAGNOSIS — M109 Gout, unspecified: Secondary | ICD-10-CM | POA: Diagnosis present

## 2018-08-31 DIAGNOSIS — E1110 Type 2 diabetes mellitus with ketoacidosis without coma: Principal | ICD-10-CM | POA: Diagnosis present

## 2018-08-31 DIAGNOSIS — N179 Acute kidney failure, unspecified: Secondary | ICD-10-CM | POA: Diagnosis present

## 2018-08-31 DIAGNOSIS — I4819 Other persistent atrial fibrillation: Secondary | ICD-10-CM | POA: Diagnosis not present

## 2018-08-31 DIAGNOSIS — Y92002 Bathroom of unspecified non-institutional (private) residence single-family (private) house as the place of occurrence of the external cause: Secondary | ICD-10-CM

## 2018-08-31 DIAGNOSIS — E43 Unspecified severe protein-calorie malnutrition: Secondary | ICD-10-CM | POA: Diagnosis not present

## 2018-08-31 DIAGNOSIS — F329 Major depressive disorder, single episode, unspecified: Secondary | ICD-10-CM | POA: Diagnosis present

## 2018-08-31 DIAGNOSIS — R609 Edema, unspecified: Secondary | ICD-10-CM | POA: Diagnosis not present

## 2018-08-31 DIAGNOSIS — Z87891 Personal history of nicotine dependence: Secondary | ICD-10-CM

## 2018-08-31 DIAGNOSIS — M47816 Spondylosis without myelopathy or radiculopathy, lumbar region: Secondary | ICD-10-CM | POA: Diagnosis not present

## 2018-08-31 LAB — CBC WITH DIFFERENTIAL/PLATELET
Abs Immature Granulocytes: 0.07 10*3/uL (ref 0.00–0.07)
Basophils Absolute: 0 10*3/uL (ref 0.0–0.1)
Basophils Relative: 0 %
Eosinophils Absolute: 0 10*3/uL (ref 0.0–0.5)
Eosinophils Relative: 0 %
HCT: 49 % — ABNORMAL HIGH (ref 36.0–46.0)
Hemoglobin: 16.8 g/dL — ABNORMAL HIGH (ref 12.0–15.0)
Immature Granulocytes: 0 %
Lymphocytes Relative: 6 %
Lymphs Abs: 1 10*3/uL (ref 0.7–4.0)
MCH: 32.1 pg (ref 26.0–34.0)
MCHC: 34.3 g/dL (ref 30.0–36.0)
MCV: 93.7 fL (ref 80.0–100.0)
Monocytes Absolute: 0.7 10*3/uL (ref 0.1–1.0)
Monocytes Relative: 4 %
Neutro Abs: 14.8 10*3/uL — ABNORMAL HIGH (ref 1.7–7.7)
Neutrophils Relative %: 90 %
Platelets: 151 10*3/uL (ref 150–400)
RBC: 5.23 MIL/uL — ABNORMAL HIGH (ref 3.87–5.11)
RDW: 14.4 % (ref 11.5–15.5)
WBC: 16.5 10*3/uL — ABNORMAL HIGH (ref 4.0–10.5)
nRBC: 0 % (ref 0.0–0.2)

## 2018-08-31 MED ORDER — SODIUM CHLORIDE 0.9 % IV BOLUS
1000.0000 mL | Freq: Once | INTRAVENOUS | Status: AC
  Administered 2018-09-01: 1000 mL via INTRAVENOUS

## 2018-08-31 NOTE — ED Notes (Signed)
Pt washed from head to toe with stool everywhere

## 2018-08-31 NOTE — ED Triage Notes (Signed)
The pt arrived by germs  From home  Apparently the pt fell 2 or 3 days ago.  She lives alone  Today her family found her lying face down  In her floor covered in stool alert orineted skin cold but dry  Large swelliong across her forehead she has ulcers on some of her toes where she has been lying on them   She takes a blood thinner   Hr in the 140wssaf

## 2018-08-31 NOTE — ED Provider Notes (Signed)
[facility] EMERGENCY DEPARTMENT Provider Note   CSN: [id number] Arrival date & time: 08/31/18  2222     History   Chief Complaint Chief Complaint  Patient presents with   Fall    HPI Teresa Peck is a 78 y.o. female.     Patient with past medical history markable for CAD, CHF, diabetes, A. fib, anticoagulated on Eliquis presents to the emergency department with a chief complaint of fall.  She was found down in her home in the bathroom covered in stool.  She was last seen up and active on Friday (2 days ago).  It is unclear when she fell.  She was found by the sheriff and by her son.  She complains of pain in her head, neck, hips, and right knee.  She denies any chest pain, back pain, or abdominal pain.    The history is provided by the patient and a relative. No language interpreter was used.    Past Medical History:  Diagnosis Date   Anemia    Arthritis    CAD (coronary artery disease)    CAD (coronary artery disease)    Cancer (HCC)    skin cancer   CHF (congestive heart failure) (HCC)    Coronary artery disease    MI 2004-stent   Diabetes mellitus    Diabetic neuropathy (HCC)    GERD (gastroesophageal reflux disease)    tums   Gout    Hiatal hernia    Hyperlipidemia    Hypertension    PAD (peripheral artery disease) (HCC)    Tremor     Patient Active Problem List   Diagnosis Date Noted   Chronic atrial fibrillation 10/30/2016   Lumbar stenosis with neurogenic claudication 97/35/3299   Chronic systolic CHF (congestive heart failure) ([place]) 10/19/2014   DIAB W/O COMP TYPE II/UNS NOT STATED UNCNTRL 11/08/2009   Hyperlipidemia 11/08/2009   CAD (coronary artery disease) 11/08/2009   CARDIOMYOPATHY, ISCHEMIC 11/08/2009    Past Surgical History:  Procedure Laterality Date   BILATERAL CORNEA IMPLANTS  2010   CARDIAC CATHETERIZATION     stent post MI    CHOLECYSTECTOMY     CORONARY ANGIOPLASTY     2004   stent   EYE SURGERY     KNEE SURGERY  1974   right   LUMBAR LAMINECTOMY/DECOMPRESSION MICRODISCECTOMY N/A 04/13/2015   Procedure: L4-5 Decompression;  Surgeon: Marybelle Killings, MD;  Location: [facility];  Service: Orthopedics;  Laterality: N/A;  Need RNFA     OB History   No obstetric history on file.      Home Medications    Prior to Admission medications   Medication Sig Start Date End Date Taking? Authorizing Provider  allopurinol (ZYLOPRIM) 300 MG tablet Take 300 mg by mouth daily.    [provider]  apixaban (ELIQUIS) 5 MG TABS tablet Take 1 tablet (5 mg total) by mouth 2 (two) times daily. 05/19/18   Nahser, Wonda Cheng, MD  furosemide (LASIX) 20 MG tablet Take 20 mg by mouth daily.    [provider]  gabapentin (NEURONTIN) 600 MG tablet Take 600 mg by mouth 2 (two) times daily.      [provider]  insulin glargine (LANTUS) 100 unit/mL SOPN Inject 8 Units into the skin 2 (two) times daily.     [provider]  Magnesium Oxide 400 (240 Mg) MG TABS Take 1 tablet (400 mg total) by mouth daily. 10/31/16   Nahser, Wonda Cheng, MD  metFORMIN (GLUCOPHAGE) 500 MG tablet Take 500 mg by mouth 2 (two) times daily with a meal.    [provider]  metoprolol succinate (TOPROL-XL) 25 MG 24 hr tablet Take 25 mg by mouth daily.    [provider]  nitroGLYCERIN (NITROSTAT) 0.4 MG SL tablet PLACE 1 TABLET UNDER THE TONGUE EVERY 5 MINUTES AS NEEDED FOR CHEST PAIN Patient taking differently: Place 0.4 mg under the tongue every 5 (five) minutes as needed for chest pain.  08/13/17   Nahser, Wonda Cheng, MD  RaNITidine HCl (ZANTAC PO) Take 1 tablet by mouth daily.    [provider]  rosuvastatin (CRESTOR) 10 MG tablet Take 10 mg by mouth daily.    [provider]    Family History Family History  Problem Relation Age of Onset   Heart attack Father    Heart attack Sister    Heart attack Brother    CAD Mother    Stroke Mother     Heart disease Mother    Cancer Mother     Social History Social History   Tobacco Use   Smoking status: Former Smoker    Packs/day: 0.50    Years: 5.00    Pack years: 2.50    Types: Cigarettes    Quit date: 02/19/1978    Years since quitting: 40.5   Smokeless tobacco: Never Used   Tobacco comment: Quit 1982  Substance Use Topics   Alcohol use: No    Alcohol/week: 0.0 standard drinks   Drug use: No     Allergies   Sulfur   Review of Systems Review of Systems  All other systems reviewed and are negative.    Physical Exam Updated Vital Signs BP (!) 144/118    Pulse (!) 143    Temp (!) 96 F (35.6 C) (Temporal)    Resp 20    Wt 78.9 kg    SpO2 98%    BMI 29.86 kg/m   Physical Exam Vitals signs and nursing note reviewed.  Constitutional:      General: She is not in acute distress.    Appearance: She is well-developed.  HENT:     Head: Normocephalic and atraumatic.     Comments: Contusions to right temple and right cheek Eyes:     Conjunctiva/sclera: Conjunctivae normal.  Neck:     Musculoskeletal: Neck supple.  Cardiovascular:     Rate and Rhythm: Normal rate and regular rhythm.     Heart sounds: No murmur.  Pulmonary:     Effort: Pulmonary effort is normal. No respiratory distress.     Breath sounds: Normal breath sounds.  Abdominal:     Palpations: Abdomen is soft.     Tenderness: There is no abdominal tenderness.  Musculoskeletal:     Comments: Tenderness to palpation of the right wrist, range of motion limited secondary to pain, otherwise normal range of motion and strength without any bony abnormality or deformity in the bilateral upper extremities  Pain with range of motion of the right knee and right hip Range of motion of left knee and left hip is 5/5   Skin:    General: Skin is warm and dry.  Neurological:     Mental Status: She is alert and oriented to person, place, and time.  Psychiatric:        Mood and Affect: Mood normal.         Behavior: Behavior normal.        Thought Content: Thought content  normal.        Judgment: Judgment normal.      ED Treatments / Results  Labs (all labs ordered are listed, but only abnormal results are displayed) Labs Reviewed  CBC WITH DIFFERENTIAL/PLATELET - Abnormal; Notable for the following components:      Result Value   WBC 16.5 (*)    RBC 5.23 (*)    Hemoglobin 16.8 (*)    HCT 49.0 (*)    Neutro Abs 14.8 (*)    All other components within normal limits  COMPREHENSIVE METABOLIC PANEL - Abnormal; Notable for the following components:   Potassium 5.3 (*)    CO2 18 (*)    Glucose, Bld 433 (*)    BUN 67 (*)    Creatinine, Ser 1.73 (*)    Albumin 3.0 (*)    AST 121 (*)    ALT 109 (*)    GFR calc non Af Amer 27 (*)    GFR calc Af Amer 32 (*)    Anion gap 17 (*)    All other components within normal limits  URINALYSIS, ROUTINE W REFLEX MICROSCOPIC - Abnormal; Notable for the following components:   APPearance CLOUDY (*)    Glucose, UA >=500 (*)    Ketones, ur 20 (*)    Protein, ur 30 (*)    Bacteria, UA RARE (*)    All other components within normal limits  SARS CORONAVIRUS 2 (HOSPITAL ORDER, [facility])  CK  CK  BASIC METABOLIC PANEL  BASIC METABOLIC PANEL  BASIC METABOLIC PANEL  BASIC METABOLIC PANEL  HEMOGLOBIN A1C  TSH  CBC  COMPREHENSIVE METABOLIC PANEL  HEPATITIS PANEL, ACUTE  POC OCCULT BLOOD, ED  TROPONIN I (HIGH SENSITIVITY)    EKG None  Radiology Dg Wrist Complete Right  Result Date: 09/01/2018 CLINICAL DATA:  78 y/o  F; right wrist pain after fall. EXAM: RIGHT WRIST - COMPLETE 3+ VIEW COMPARISON:  None. FINDINGS: There is no evidence of fracture or dislocation. There is no evidence of arthropathy or other focal bone abnormality. Soft tissues are unremarkable. IMPRESSION: No acute fracture or dislocation identified. Electronically Signed   By: Kristine Garbe M.D.   On: 09/01/2018 00:57   Ct Head Wo  Contrast  Result Date: 09/01/2018 CLINICAL DATA:  Today. Large forehead swelling. EXAM: CT HEAD WITHOUT CONTRAST CT CERVICAL SPINE WITHOUT CONTRAST TECHNIQUE: Multidetector CT imaging of the head and cervical spine was performed following the standard protocol without intravenous contrast. Multiplanar CT image reconstructions of the cervical spine were also generated. COMPARISON:  Head CT without contrast 06/18/2011 FINDINGS: CT HEAD FINDINGS Brain: Cerebral volume is within normal limits for age. There is chronic subcortical white matter hypodensity at the anterior left operculum which is stable (series 4, image 22). Mild chronic overlying cortical encephalomalacia. Otherwise normal Gray-white matter differentiation throughout the brain. No midline shift, ventriculomegaly, mass effect, evidence of mass lesion, intracranial hemorrhage or evidence of cortically based acute infarction. Vascular: Mild for age Calcified atherosclerosis at the skull base. No suspicious intracranial vascular hyperdensity. Skull: Stable and intact. Sinuses/Orbits: Mild frontoethmoidal fluid and bubbly opacity. Mild maxillary mucosal thickening. Other paranasal sinuses are well pneumatized. There is trace bilateral mastoid fluid. Other: Broad-based right side scalp hematoma up to 8 millimeters in thickness. Hematoma tracks anteriorly to the right forehead. New left forehead soft tissue scarring since 2013. Orbits soft tissues appears stable and negative. Mild right premalar soft tissue stranding. CT CERVICAL SPINE FINDINGS Alignment: Straightening of cervical  lordosis. Cervicothoracic junction alignment is within normal limits. Bilateral posterior element alignment is within normal limits. Skull base and vertebrae: Visualized skull base is intact. No atlanto-occipital dissociation. Ankylosis of the left C2-C3 posterior elements. Interbody ankylosis at C5-C6, appears acquired rather than congenital. Developing ankylosis at C7-T1 including  the left posterior element. No acute osseous abnormality identified. Soft tissues and spinal canal: No prevertebral fluid or swelling. No visible canal hematoma. Asymmetric soft tissue stranding in the right neck and supraclavicular fossa (series 9, image 57). No discrete hematoma. Partially retropharyngeal course of the carotids, normal variant. Disc levels: Widespread cervical spine degeneration. Mild spinal stenosis at least at C3-C4 and C6-C7. Upper chest: Visible upper thoracic levels appear intact. The right neck soft tissue swelling continues to the anterior right chest wall. IMPRESSION: 1. Right scalp hematoma without skull fracture. 2. Stable since 2013 non contrast CT appearance of the brain. Minimal encephalomalacia at the anterior left operculum. 3. Probably posttraumatic soft tissue stranding/contusion in the right neck, but no discrete neck hematoma. 4. No acute traumatic injury identified in the cervical spine. 5. 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 Electronically Signed   By: Genevie Ann M.D.   On: 09/01/2018 00:03   Ct Cervical Spine Wo Contrast  Result Date: 09/01/2018 CLINICAL DATA:  Today. Large forehead swelling. EXAM: CT HEAD WITHOUT CONTRAST CT CERVICAL SPINE WITHOUT CONTRAST TECHNIQUE: Multidetector CT imaging of the head and cervical spine was performed following the standard protocol without intravenous contrast. Multiplanar CT image reconstructions of the cervical spine were also generated. COMPARISON:  Head CT without contrast 06/18/2011 FINDINGS: CT HEAD FINDINGS Brain: Cerebral volume is within normal limits for age. There is chronic subcortical white matter hypodensity at the anterior left operculum which is stable (series 4, image 22). Mild chronic overlying cortical encephalomalacia. Otherwise normal Gray-white matter differentiation throughout the brain. No midline shift, ventriculomegaly, mass effect, evidence of mass lesion, intracranial hemorrhage or  evidence of cortically based acute infarction. Vascular: Mild for age Calcified atherosclerosis at the skull base. No suspicious intracranial vascular hyperdensity. Skull: Stable and intact. Sinuses/Orbits: Mild frontoethmoidal fluid and bubbly opacity. Mild maxillary mucosal thickening. Other paranasal sinuses are well pneumatized. There is trace bilateral mastoid fluid. Other: Broad-based right side scalp hematoma up to 8 millimeters in thickness. Hematoma tracks anteriorly to the right forehead. New left forehead soft tissue scarring since 2013. Orbits soft tissues appears stable and negative. Mild right premalar soft tissue stranding. CT CERVICAL SPINE FINDINGS Alignment: Straightening of cervical lordosis. Cervicothoracic junction alignment is within normal limits. Bilateral posterior element alignment is within normal limits. Skull base and vertebrae: Visualized skull base is intact. No atlanto-occipital dissociation. Ankylosis of the left C2-C3 posterior elements. Interbody ankylosis at C5-C6, appears acquired rather than congenital. Developing ankylosis at C7-T1 including the left posterior element. No acute osseous abnormality identified. Soft tissues and spinal canal: No prevertebral fluid or swelling. No visible canal hematoma. Asymmetric soft tissue stranding in the right neck and supraclavicular fossa (series 9, image 57). No discrete hematoma. Partially retropharyngeal course of the carotids, normal variant. Disc levels: Widespread cervical spine degeneration. Mild spinal stenosis at least at C3-C4 and C6-C7. Upper chest: Visible upper thoracic levels appear intact. The right neck soft tissue swelling continues to the anterior right chest wall. IMPRESSION: 1. Right scalp hematoma without skull fracture. 2. Stable since 2013 non contrast CT appearance of the brain. Minimal encephalomalacia at the anterior left operculum. 3. Probably posttraumatic soft tissue stranding/contusion in the right neck, but no  discrete neck hematoma. 4. No acute traumatic injury identified in the cervical spine. 5. 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 Electronically Signed   By: Genevie Ann M.D.   On: 09/01/2018 00:03   Dg Pelvis Portable  Result Date: 08/31/2018 CLINICAL DATA:  Fall 2 days ago with pelvic pain, initial encounter EXAM: PORTABLE PELVIS 1-2 VIEWS COMPARISON:  None. FINDINGS: Degenerative changes of the lumbar spine are seen. Degenerative changes of the hip joints are noted bilaterally. No soft tissue abnormality is seen. No fractures are seen. IMPRESSION: No acute abnormality noted. Electronically Signed   By: Inez Catalina M.D.   On: 08/31/2018 23:57   Dg Chest Port 1 View  Result Date: 08/31/2018 CLINICAL DATA:  Recent fall with chest pain, initial encounter EXAM: PORTABLE CHEST 1 VIEW COMPARISON:  None. FINDINGS: Cardiac shadow is at the upper limits of normal in size. Aortic calcifications are seen. The lungs are well aerated without focal infiltrate or sizable effusion. No bony abnormality is seen. IMPRESSION: No acute abnormality noted. Electronically Signed   By: Inez Catalina M.D.   On: 08/31/2018 23:58   Dg Knee Right Port  Result Date: 08/31/2018 CLINICAL DATA:  Recent fall with right knee pain, initial encounter EXAM: PORTABLE RIGHT KNEE - 2 VIEW COMPARISON:  None. FINDINGS: Degenerative changes of the right knee joint are seen. No sizable effusion is noted. No acute fracture or dislocation is seen. IMPRESSION: Degenerative change without acute abnormality. Electronically Signed   By: Inez Catalina M.D.   On: 08/31/2018 23:57    Procedures Procedures (including critical care time) CRITICAL CARE Performed by: Montine Circle Afib with RVR, mild DKA, cardizem infusion  Total critical care time: 45 minutes  Critical care time was exclusive of separately billable procedures and treating other patients.  Critical care was necessary to treat or prevent imminent or  life-threatening deterioration.  Critical care was time spent personally by me on the following activities: development of treatment plan with patient and/or surrogate as well as nursing, discussions with consultants, evaluation of patient's response to treatment, examination of patient, obtaining history from patient or surrogate, ordering and performing treatments and interventions, ordering and review of laboratory studies, ordering and review of radiographic studies, pulse oximetry and re-evaluation of patient's condition.  Medications Ordered in ED Medications - No data to display   Initial Impression / Assessment and Plan / ED Course  I have reviewed the triage vital signs and the nursing notes.  Pertinent labs & imaging results that were available during my care of the patient were reviewed by me and considered in my medical decision making (see chart for details).        Patient found down at her home.  Had apparently been down for a day or 2.  Has history of A. fib.  Noted to be in A. fib with RVR.  She responds to all questions appropriately.  She does have a bruise on her neck and abrasion to the right side of her face.  Will check CTs.  We will also obtain plain films of right wrist, hips, knee.  Plain films reassuring.  CTs are as above, and are reassuring.  Patient given a liter of fluid to see if this will convert her out of A. fib with RVR, due to her likely being very dehydrated.  Patient will need IV Cardizem load and infusion, as initial liter of fluid did not help her convert.  Patient also appears to be in mild DKA.  Will give some insulin.  Patient seen by discussed with Dr. Leonette Monarch, who agrees with plan for admission to the hospitalist.  Appreciate Dr. Maudie Mercury for admitting the patient.  Final Clinical Impressions(s) / ED Diagnoses   Final diagnoses:  Atrial fibrillation with RVR ([place])  Fall, initial encounter  Diabetic ketoacidosis without coma associated  with type 2 diabetes mellitus [facility])    ED Discharge Orders    None       Montine Circle, PA-C 09/01/18 0328    Fatima Blank, MD 09/02/18 [phone]

## 2018-09-01 ENCOUNTER — Inpatient Hospital Stay (HOSPITAL_COMMUNITY): Payer: Medicare HMO

## 2018-09-01 ENCOUNTER — Encounter (HOSPITAL_COMMUNITY): Payer: Self-pay | Admitting: Internal Medicine

## 2018-09-01 ENCOUNTER — Emergency Department (HOSPITAL_COMMUNITY): Payer: Medicare HMO

## 2018-09-01 DIAGNOSIS — T40605A Adverse effect of unspecified narcotics, initial encounter: Secondary | ICD-10-CM | POA: Diagnosis not present

## 2018-09-01 DIAGNOSIS — I2583 Coronary atherosclerosis due to lipid rich plaque: Secondary | ICD-10-CM

## 2018-09-01 DIAGNOSIS — I4891 Unspecified atrial fibrillation: Secondary | ICD-10-CM

## 2018-09-01 DIAGNOSIS — I2510 Atherosclerotic heart disease of native coronary artery without angina pectoris: Secondary | ICD-10-CM | POA: Diagnosis present

## 2018-09-01 DIAGNOSIS — I482 Chronic atrial fibrillation, unspecified: Secondary | ICD-10-CM

## 2018-09-01 DIAGNOSIS — F329 Major depressive disorder, single episode, unspecified: Secondary | ICD-10-CM | POA: Diagnosis present

## 2018-09-01 DIAGNOSIS — R339 Retention of urine, unspecified: Secondary | ICD-10-CM | POA: Diagnosis present

## 2018-09-01 DIAGNOSIS — M6282 Rhabdomyolysis: Secondary | ICD-10-CM | POA: Diagnosis present

## 2018-09-01 DIAGNOSIS — Y92002 Bathroom of unspecified non-institutional (private) residence single-family (private) house as the place of occurrence of the external cause: Secondary | ICD-10-CM | POA: Diagnosis not present

## 2018-09-01 DIAGNOSIS — I4819 Other persistent atrial fibrillation: Secondary | ICD-10-CM | POA: Diagnosis present

## 2018-09-01 DIAGNOSIS — E1110 Type 2 diabetes mellitus with ketoacidosis without coma: Secondary | ICD-10-CM | POA: Diagnosis present

## 2018-09-01 DIAGNOSIS — E1151 Type 2 diabetes mellitus with diabetic peripheral angiopathy without gangrene: Secondary | ICD-10-CM | POA: Diagnosis present

## 2018-09-01 DIAGNOSIS — S0003XA Contusion of scalp, initial encounter: Secondary | ICD-10-CM | POA: Diagnosis present

## 2018-09-01 DIAGNOSIS — E43 Unspecified severe protein-calorie malnutrition: Secondary | ICD-10-CM | POA: Diagnosis present

## 2018-09-01 DIAGNOSIS — W010XXA Fall on same level from slipping, tripping and stumbling without subsequent striking against object, initial encounter: Secondary | ICD-10-CM | POA: Diagnosis present

## 2018-09-01 DIAGNOSIS — I5022 Chronic systolic (congestive) heart failure: Secondary | ICD-10-CM | POA: Diagnosis present

## 2018-09-01 DIAGNOSIS — Z955 Presence of coronary angioplasty implant and graft: Secondary | ICD-10-CM | POA: Diagnosis not present

## 2018-09-01 DIAGNOSIS — N179 Acute kidney failure, unspecified: Secondary | ICD-10-CM | POA: Diagnosis present

## 2018-09-01 DIAGNOSIS — E1140 Type 2 diabetes mellitus with diabetic neuropathy, unspecified: Secondary | ICD-10-CM | POA: Diagnosis present

## 2018-09-01 DIAGNOSIS — G9341 Metabolic encephalopathy: Secondary | ICD-10-CM | POA: Diagnosis not present

## 2018-09-01 DIAGNOSIS — R945 Abnormal results of liver function studies: Secondary | ICD-10-CM | POA: Diagnosis present

## 2018-09-01 DIAGNOSIS — E875 Hyperkalemia: Secondary | ICD-10-CM | POA: Diagnosis present

## 2018-09-01 DIAGNOSIS — K7200 Acute and subacute hepatic failure without coma: Secondary | ICD-10-CM | POA: Diagnosis not present

## 2018-09-01 DIAGNOSIS — E1111 Type 2 diabetes mellitus with ketoacidosis with coma: Secondary | ICD-10-CM | POA: Diagnosis not present

## 2018-09-01 DIAGNOSIS — T886XXA Anaphylactic reaction due to adverse effect of correct drug or medicament properly administered, initial encounter: Secondary | ICD-10-CM | POA: Diagnosis not present

## 2018-09-01 DIAGNOSIS — N139 Obstructive and reflux uropathy, unspecified: Secondary | ICD-10-CM | POA: Diagnosis present

## 2018-09-01 DIAGNOSIS — I110 Hypertensive heart disease with heart failure: Secondary | ICD-10-CM | POA: Diagnosis present

## 2018-09-01 DIAGNOSIS — E860 Dehydration: Secondary | ICD-10-CM | POA: Diagnosis present

## 2018-09-01 DIAGNOSIS — Z1159 Encounter for screening for other viral diseases: Secondary | ICD-10-CM | POA: Diagnosis not present

## 2018-09-01 DIAGNOSIS — I248 Other forms of acute ischemic heart disease: Secondary | ICD-10-CM | POA: Diagnosis present

## 2018-09-01 LAB — BASIC METABOLIC PANEL
Anion gap: 16 — ABNORMAL HIGH (ref 5–15)
Anion gap: 13 (ref 5–15)
Anion gap: 13 (ref 5–15)
Anion gap: 9 (ref 5–15)
BUN: 67 mg/dL — ABNORMAL HIGH (ref 8–23)
BUN: 64 mg/dL — ABNORMAL HIGH (ref 8–23)
BUN: 65 mg/dL — ABNORMAL HIGH (ref 8–23)
BUN: 67 mg/dL — ABNORMAL HIGH (ref 8–23)
CO2: 19 mmol/L — ABNORMAL LOW (ref 22–32)
CO2: 19 mmol/L — ABNORMAL LOW (ref 22–32)
CO2: 19 mmol/L — ABNORMAL LOW (ref 22–32)
CO2: 22 mmol/L (ref 22–32)
Calcium: 9.1 mg/dL (ref 8.9–10.3)
Calcium: 9 mg/dL (ref 8.9–10.3)
Calcium: 8.6 mg/dL — ABNORMAL LOW (ref 8.9–10.3)
Calcium: 8.6 mg/dL — ABNORMAL LOW (ref 8.9–10.3)
Chloride: 103 mmol/L (ref 98–111)
Chloride: 109 mmol/L (ref 98–111)
Chloride: 109 mmol/L (ref 98–111)
Chloride: 108 mmol/L (ref 98–111)
Creatinine, Ser: 1.48 mg/dL — ABNORMAL HIGH (ref 0.44–1.00)
Creatinine, Ser: 1.33 mg/dL — ABNORMAL HIGH (ref 0.44–1.00)
Creatinine, Ser: 1.19 mg/dL — ABNORMAL HIGH (ref 0.44–1.00)
Creatinine, Ser: 1.25 mg/dL — ABNORMAL HIGH (ref 0.44–1.00)
GFR calc Af Amer: 38 mL/min — ABNORMAL LOW (ref >60)
GFR calc Af Amer: 44 mL/min — ABNORMAL LOW (ref >60)
GFR calc Af Amer: 50 mL/min — ABNORMAL LOW (ref >60)
GFR calc Af Amer: 47 mL/min — ABNORMAL LOW (ref >60)
GFR calc non Af Amer: 33 mL/min — ABNORMAL LOW (ref >60)
GFR calc non Af Amer: 38 mL/min — ABNORMAL LOW (ref >60)
GFR calc non Af Amer: 43 mL/min — ABNORMAL LOW (ref >60)
GFR calc non Af Amer: 41 mL/min — ABNORMAL LOW (ref >60)
Glucose, Bld: 379 mg/dL — ABNORMAL HIGH (ref 70–99)
Glucose, Bld: 187 mg/dL — ABNORMAL HIGH (ref 70–99)
Glucose, Bld: 145 mg/dL — ABNORMAL HIGH (ref 70–99)
Glucose, Bld: 218 mg/dL — ABNORMAL HIGH (ref 70–99)
Potassium: 4.7 mmol/L (ref 3.5–5.1)
Potassium: 3.7 mmol/L (ref 3.5–5.1)
Potassium: 3.7 mmol/L (ref 3.5–5.1)
Potassium: 4 mmol/L (ref 3.5–5.1)
Sodium: 138 mmol/L (ref 135–145)
Sodium: 141 mmol/L (ref 135–145)
Sodium: 141 mmol/L (ref 135–145)
Sodium: 139 mmol/L (ref 135–145)

## 2018-09-01 LAB — URINALYSIS, ROUTINE W REFLEX MICROSCOPIC
Bilirubin Urine: NEGATIVE
Glucose, UA: >=500 mg/dL — AB
Hgb urine dipstick: NEGATIVE
Ketones, ur: 20 mg/dL — AB
Leukocytes,Ua: NEGATIVE
Nitrite: NEGATIVE
Protein, ur: 30 mg/dL — AB
Specific Gravity, Urine: 1.02 (ref 1.005–1.030)
pH: 5 (ref 5.0–8.0)

## 2018-09-01 LAB — COMPREHENSIVE METABOLIC PANEL
ALT: 109 U/L — ABNORMAL HIGH (ref 0–44)
ALT: 89 U/L — ABNORMAL HIGH (ref 0–44)
AST: 121 U/L — ABNORMAL HIGH (ref 15–41)
AST: 90 U/L — ABNORMAL HIGH (ref 15–41)
Albumin: 3 g/dL — ABNORMAL LOW (ref 3.5–5.0)
Albumin: 2.6 g/dL — ABNORMAL LOW (ref 3.5–5.0)
Alkaline Phosphatase: 65 U/L (ref 38–126)
Alkaline Phosphatase: 57 U/L (ref 38–126)
Anion gap: 17 — ABNORMAL HIGH (ref 5–15)
Anion gap: 15 (ref 5–15)
BUN: 67 mg/dL — ABNORMAL HIGH (ref 8–23)
BUN: 66 mg/dL — ABNORMAL HIGH (ref 8–23)
CO2: 18 mmol/L — ABNORMAL LOW (ref 22–32)
CO2: 20 mmol/L — ABNORMAL LOW (ref 22–32)
Calcium: 9.6 mg/dL (ref 8.9–10.3)
Calcium: 9.1 mg/dL (ref 8.9–10.3)
Chloride: 100 mmol/L (ref 98–111)
Chloride: 102 mmol/L (ref 98–111)
Creatinine, Ser: 1.73 mg/dL — ABNORMAL HIGH (ref 0.44–1.00)
Creatinine, Ser: 1.48 mg/dL — ABNORMAL HIGH (ref 0.44–1.00)
GFR calc Af Amer: 32 mL/min — ABNORMAL LOW (ref >60)
GFR calc Af Amer: 38 mL/min — ABNORMAL LOW (ref >60)
GFR calc non Af Amer: 27 mL/min — ABNORMAL LOW (ref >60)
GFR calc non Af Amer: 33 mL/min — ABNORMAL LOW (ref >60)
Glucose, Bld: 433 mg/dL — ABNORMAL HIGH (ref 70–99)
Glucose, Bld: 395 mg/dL — ABNORMAL HIGH (ref 70–99)
Potassium: 5.3 mmol/L — ABNORMAL HIGH (ref 3.5–5.1)
Potassium: 4.6 mmol/L (ref 3.5–5.1)
Sodium: 135 mmol/L (ref 135–145)
Sodium: 137 mmol/L (ref 135–145)
Total Bilirubin: UNDETERMINED mg/dL (ref 0.3–1.2)
Total Bilirubin: 1.4 mg/dL — ABNORMAL HIGH (ref 0.3–1.2)
Total Protein: 7.1 g/dL (ref 6.5–8.1)
Total Protein: 6 g/dL — ABNORMAL LOW (ref 6.5–8.1)

## 2018-09-01 LAB — CBC
HCT: 45.4 % (ref 36.0–46.0)
Hemoglobin: 15.2 g/dL — ABNORMAL HIGH (ref 12.0–15.0)
MCH: 32.2 pg (ref 26.0–34.0)
MCHC: 33.5 g/dL (ref 30.0–36.0)
MCV: 96.2 fL (ref 80.0–100.0)
Platelets: 181 10*3/uL (ref 150–400)
RBC: 4.72 MIL/uL (ref 3.87–5.11)
RDW: 14.5 % (ref 11.5–15.5)
WBC: 15.5 10*3/uL — ABNORMAL HIGH (ref 4.0–10.5)
nRBC: 0 % (ref 0.0–0.2)

## 2018-09-01 LAB — GLUCOSE, CAPILLARY
Glucose-Capillary: 361 mg/dL — ABNORMAL HIGH (ref 70–99)
Glucose-Capillary: 327 mg/dL — ABNORMAL HIGH (ref 70–99)
Glucose-Capillary: 319 mg/dL — ABNORMAL HIGH (ref 70–99)
Glucose-Capillary: 290 mg/dL — ABNORMAL HIGH (ref 70–99)
Glucose-Capillary: 220 mg/dL — ABNORMAL HIGH (ref 70–99)
Glucose-Capillary: 163 mg/dL — ABNORMAL HIGH (ref 70–99)
Glucose-Capillary: 99 mg/dL (ref 70–99)
Glucose-Capillary: 86 mg/dL (ref 70–99)
Glucose-Capillary: 87 mg/dL (ref 70–99)
Glucose-Capillary: 125 mg/dL — ABNORMAL HIGH (ref 70–99)
Glucose-Capillary: 135 mg/dL — ABNORMAL HIGH (ref 70–99)
Glucose-Capillary: 180 mg/dL — ABNORMAL HIGH (ref 70–99)
Glucose-Capillary: 180 mg/dL — ABNORMAL HIGH (ref 70–99)
Glucose-Capillary: 184 mg/dL — ABNORMAL HIGH (ref 70–99)
Glucose-Capillary: 195 mg/dL — ABNORMAL HIGH (ref 70–99)

## 2018-09-01 LAB — CBG MONITORING, ED: Glucose-Capillary: 358 mg/dL — ABNORMAL HIGH (ref 70–99)

## 2018-09-01 LAB — HEMOGLOBIN A1C
Hgb A1c MFr Bld: 8.1 % — ABNORMAL HIGH (ref 4.8–5.6)
Mean Plasma Glucose: 185.77 mg/dL

## 2018-09-01 LAB — TROPONIN I (HIGH SENSITIVITY)
Troponin I (High Sensitivity): 54 ng/L — ABNORMAL HIGH (ref <18)
Troponin I (High Sensitivity): 66 ng/L — ABNORMAL HIGH (ref <18)

## 2018-09-01 LAB — CK
Total CK: UNDETERMINED U/L (ref 38–234)
Total CK: 1562 U/L — ABNORMAL HIGH (ref 38–234)

## 2018-09-01 LAB — ECHOCARDIOGRAM COMPLETE: Weight: 2617.3 oz

## 2018-09-01 LAB — TSH: TSH: 0.693 u[IU]/mL (ref 0.350–4.500)

## 2018-09-01 LAB — POC OCCULT BLOOD, ED: Fecal Occult Bld: POSITIVE — AB

## 2018-09-01 LAB — SARS CORONAVIRUS 2 BY RT PCR (HOSPITAL ORDER, PERFORMED IN ~~LOC~~ HOSPITAL LAB): SARS Coronavirus 2: NEGATIVE

## 2018-09-01 MED ORDER — MAGNESIUM OXIDE 400 (241.3 MG) MG PO TABS
400.0000 mg | ORAL_TABLET | Freq: Every day | ORAL | Status: DC
  Administered 2018-09-01 – 2018-09-05 (×5): 400 mg via ORAL
  Filled 2018-09-01 (×5): qty 1

## 2018-09-01 MED ORDER — METOPROLOL TARTRATE 5 MG/5ML IV SOLN: 5.0000 mg | Freq: Four times a day (QID) | INTRAVENOUS | Status: DC | PRN

## 2018-09-01 MED ORDER — INSULIN REGULAR(HUMAN) IN NACL 100-0.9 UT/100ML-% IV SOLN
INTRAVENOUS | Status: DC
  Administered 2018-09-01: 3 [IU]/h via INTRAVENOUS
  Filled 2018-09-01: qty 100

## 2018-09-01 MED ORDER — FUROSEMIDE 20 MG PO TABS
20.0000 mg | ORAL_TABLET | Freq: Every day | ORAL | Status: DC
  Administered 2018-09-01: 13:00:00 20 mg via ORAL
  Filled 2018-09-01 (×2): qty 1

## 2018-09-01 MED ORDER — INSULIN GLARGINE 100 UNIT/ML ~~LOC~~ SOLN
15.0000 [IU] | Freq: Every day | SUBCUTANEOUS | Status: DC
  Filled 2018-09-01: qty 0.15

## 2018-09-01 MED ORDER — INSULIN GLARGINE 100 UNIT/ML ~~LOC~~ SOLN
10.0000 [IU] | Freq: Every day | SUBCUTANEOUS | Status: DC
  Administered 2018-09-01 – 2018-09-05 (×5): 10 [IU] via SUBCUTANEOUS
  Filled 2018-09-01 (×6): qty 0.1

## 2018-09-01 MED ORDER — DILTIAZEM LOAD VIA INFUSION
20.0000 mg | Freq: Once | INTRAVENOUS | Status: AC
  Administered 2018-09-01: 5 mg via INTRAVENOUS
  Filled 2018-09-01: qty 20

## 2018-09-01 MED ORDER — APIXABAN 5 MG PO TABS
5.0000 mg | ORAL_TABLET | Freq: Two times a day (BID) | ORAL | Status: DC
  Administered 2018-09-01 – 2018-09-02 (×3): 5 mg via ORAL
  Filled 2018-09-01 (×3): qty 1

## 2018-09-01 MED ORDER — DILTIAZEM HCL-DEXTROSE 100-5 MG/100ML-% IV SOLN (PREMIX)
5.0000 mg/h | INTRAVENOUS | Status: DC
  Administered 2018-09-01: 5 mg/h via INTRAVENOUS
  Filled 2018-09-01: qty 100

## 2018-09-01 MED ORDER — METOPROLOL SUCCINATE ER 25 MG PO TB24
25.0000 mg | ORAL_TABLET | Freq: Every day | ORAL | Status: DC
  Administered 2018-09-01: 25 mg via ORAL
  Filled 2018-09-01: qty 1

## 2018-09-01 MED ORDER — HYDROCODONE-ACETAMINOPHEN 5-325 MG PO TABS
2.0000 | ORAL_TABLET | Freq: Once | ORAL | Status: AC
  Administered 2018-09-01: 2 via ORAL
  Filled 2018-09-01: qty 2

## 2018-09-01 MED ORDER — DEXTROSE-NACL 5-0.45 % IV SOLN
INTRAVENOUS | Status: DC
  Administered 2018-09-01: 12:00:00 via INTRAVENOUS

## 2018-09-01 MED ORDER — GABAPENTIN 300 MG PO CAPS
300.0000 mg | ORAL_CAPSULE | Freq: Two times a day (BID) | ORAL | Status: DC
  Administered 2018-09-01 – 2018-09-05 (×8): 300 mg via ORAL
  Filled 2018-09-01 (×8): qty 1

## 2018-09-01 MED ORDER — INSULIN ASPART 100 UNIT/ML ~~LOC~~ SOLN
0.0000 [IU] | Freq: Three times a day (TID) | SUBCUTANEOUS | Status: DC
  Administered 2018-09-02 (×3): 2 [IU] via SUBCUTANEOUS

## 2018-09-01 MED ORDER — HYDROCODONE-ACETAMINOPHEN 5-325 MG PO TABS
1.0000 | ORAL_TABLET | Freq: Four times a day (QID) | ORAL | Status: DC | PRN
  Administered 2018-09-02: 2 via ORAL
  Filled 2018-09-01: qty 2

## 2018-09-01 MED ORDER — INSULIN ASPART 100 UNIT/ML ~~LOC~~ SOLN: 0.0000 [IU] | Freq: Every day | SUBCUTANEOUS | Status: DC

## 2018-09-01 MED ORDER — ALLOPURINOL 100 MG PO TABS
100.0000 mg | ORAL_TABLET | Freq: Every day | ORAL | Status: DC
  Administered 2018-09-01 – 2018-09-05 (×5): 100 mg via ORAL
  Filled 2018-09-01 (×5): qty 1

## 2018-09-01 MED ORDER — SODIUM CHLORIDE 0.9 % IV SOLN
INTRAVENOUS | Status: DC
  Administered 2018-09-01: 05:00:00 via INTRAVENOUS

## 2018-09-01 NOTE — ED Notes (Signed)
Report called to tim on 4700

## 2018-09-01 NOTE — Progress Notes (Addendum)
Update given to daughter in law, Caren Griffins, over the phone. Would like to be called with updates from the team when possible.

## 2018-09-01 NOTE — ED Notes (Signed)
The pt is bleeding from his abd woound  edp at bedside  Pressure bandage applied

## 2018-09-01 NOTE — Progress Notes (Signed)
ANTICOAGULATION CONSULT NOTE - Initial Consult  Pharmacy Consult for eliquis Indication: atrial fibrillation  Allergies  Allergen Reactions  . Sulfur Rash    Rash/flushed, blood red eyes    Patient Measurements: Weight: 173 lb 15.1 oz (78.9 kg)  Vital Signs: Temp: 97.6 F (36.4 C) (07/12 2327) Temp Source: Temporal (07/12 2238) BP: 106/59 (07/13 0400) Pulse Rate: 143 (07/12 2238)  Labs: Recent Labs    08/31/18 2318 09/01/18 0345  HGB 16.8* 15.2*  HCT 49.0* 45.4  PLT 151 181  CREATININE 1.73*  --   CKTOTAL QUANTITY NOT SUFFICIENT, UNABLE TO PERFORM TEST  --     CrCl cannot be calculated (Unknown ideal weight.).   Medical History: Past Medical History:  Diagnosis Date  . Anemia   . Arthritis   . CAD (coronary artery disease)   . CAD (coronary artery disease)   . Cancer ([place])    skin cancer  . CHF (congestive heart failure) ([place])   . Coronary artery disease    MI 2004-stent  . Diabetes mellitus   . Diabetic neuropathy ([place])   . GERD (gastroesophageal reflux disease)    tums  . Gout   . Hiatal hernia   . Hyperlipidemia   . Hypertension   . PAD (peripheral artery disease) ([place])   . Tremor     Medications:  See medication history  Assessment: 78 yo lady to continue home eliquis for afib. Goal of Therapy:  Therapeutic anticoagulation Monitor platelets by anticoagulation protocol: Yes   Plan:  Eliquis 5mg  po bid Monitor for bleeding complications  Khamani Fairley Poteet 09/01/2018,4:17 AM

## 2018-09-01 NOTE — H&P (Addendum)
TRH H&P    Patient Demographics:    Teresa Peck, is a 78 y.o. female  MRN: [id number]  DOB - 10-09-1940  Admit Date - 08/31/2018  Referring MD/NP/PA:  Montine Circle  Outpatient Primary MD for the patient is Deland Pretty, MD  Patient coming from:   home  Chief complaint-  Found on floor   HPI:    Teresa Peck  is a 78 y.o. female,  w hypertension, hyperlipidemia, Dm2 w neuropathy, CAD , CHF , PAD, Anemia, apparently was found on floor by her family.  May have been there for 2-3 days.   In ED,  T 96  P 143 R 20 Bp 144/118  Pox 98%  Xray R knee IMPRESSION: Degenerative change without acute abnormality.  Xray pelvis IMPRESSION: No acute abnormality noted.  Xray R wrist IMPRESSION: No acute fracture or dislocation identified.  CXR IMPRESSION: No acute abnormality noted.  CT brain IMPRESSION: 1. Right scalp hematoma without skull fracture. 2. Stable since 2013 non contrast CT appearance of the brain. Minimal encephalomalacia at the anterior left operculum. 3. Probably posttraumatic soft tissue stranding/contusion in the right neck, but no discrete neck hematoma. 4. No acute traumatic injury identified in the cervical spine. 5. 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  Wbc 16.5, Hgb 16.8, Plt 151 Na 135, K 5.3 Bun 67, Creatinine 1.73 Glucose 433 AG 17, Hco3 18 Ast 121, Alt 109, Alk phos 65, T. Bili ?? Alb 3.0  cpk pending  Pt given Ns 1L iv x1 in the ED.  Pt will be admitted for rhabdomyolysis, ARF, abnormal liver function, severe protein calorie malnutrition, and DKA.      Review of systems:    In addition to the HPI above,  No Fever-chills, No Headache, No changes with Vision or hearing, No problems swallowing food or Liquids, No Chest pain, No Cough or Shortness of Breath, No Abdominal pain, No Nausea or Vomiting, bowel movements are  regular, No Blood in stool or Urine, No dysuria, No new skin rashes or bruises, No new joints pains-aches,  No new weakness, tingling, numbness in any extremity, No recent weight gain or loss, No polyuria, polydypsia or polyphagia, No significant Mental Stressors.  All other systems reviewed and are negative.    Past History of the following :    Past Medical History:  Diagnosis Date   Anemia    Arthritis    CAD (coronary artery disease)    CAD (coronary artery disease)    Cancer ([place])    skin cancer   CHF (congestive heart failure) (HCC)    Coronary artery disease    MI 2004-stent   Diabetes mellitus    Diabetic neuropathy (HCC)    GERD (gastroesophageal reflux disease)    tums   Gout    Hiatal hernia    Hyperlipidemia    Hypertension    PAD (peripheral artery disease) ([place])    Tremor       Past Surgical History:  Procedure Laterality Date   BILATERAL CORNEA IMPLANTS  2010  CARDIAC CATHETERIZATION     stent post MI    CHOLECYSTECTOMY     CORONARY ANGIOPLASTY     2004  stent   EYE SURGERY     KNEE SURGERY  1974   right   LUMBAR LAMINECTOMY/DECOMPRESSION MICRODISCECTOMY N/A 04/13/2015   Procedure: L4-5 Decompression;  Surgeon: Marybelle Killings, MD;  Location: [place];  Service: Orthopedics;  Laterality: N/A;  Need RNFA      Social History:      Social History   Tobacco Use   Smoking status: Former Smoker    Packs/day: 0.50    Years: 5.00    Pack years: 2.50    Types: Cigarettes    Quit date: 02/19/1978    Years since quitting: 40.5   Smokeless tobacco: Never Used   Tobacco comment: Quit 1982  Substance Use Topics   Alcohol use: No    Alcohol/week: 0.0 standard drinks       Family History :     Family History  Problem Relation Age of Onset   Heart attack Father    Heart attack Sister    Heart attack Brother    CAD Mother    Stroke Mother    Heart disease Mother    Cancer Mother        Home Medications:    Prior to Admission medications   Medication Sig Start Date End Date Taking? Authorizing Provider  allopurinol (ZYLOPRIM) 300 MG tablet Take 300 mg by mouth daily.    [provider]  apixaban (ELIQUIS) 5 MG TABS tablet Take 1 tablet (5 mg total) by mouth 2 (two) times daily. 05/19/18   Nahser, Wonda Cheng, MD  furosemide (LASIX) 20 MG tablet Take 20 mg by mouth daily.    [provider]  gabapentin (NEURONTIN) 600 MG tablet Take 600 mg by mouth 2 (two) times daily.      [provider]  insulin glargine (LANTUS) 100 unit/mL SOPN Inject 8 Units into the skin 2 (two) times daily.     [provider]  Magnesium Oxide 400 (240 Mg) MG TABS Take 1 tablet (400 mg total) by mouth daily. 10/31/16   Nahser, Wonda Cheng, MD  metFORMIN (GLUCOPHAGE) 500 MG tablet Take 500 mg by mouth 2 (two) times daily with a meal.    [provider]  metoprolol succinate (TOPROL-XL) 25 MG 24 hr tablet Take 25 mg by mouth daily.    [provider]  nitroGLYCERIN (NITROSTAT) 0.4 MG SL tablet PLACE 1 TABLET UNDER THE TONGUE EVERY 5 MINUTES AS NEEDED FOR CHEST PAIN Patient taking differently: Place 0.4 mg under the tongue every 5 (five) minutes as needed for chest pain.  08/13/17   Nahser, Wonda Cheng, MD  RaNITidine HCl (ZANTAC PO) Take 1 tablet by mouth daily.    [provider]  rosuvastatin (CRESTOR) 10 MG tablet Take 10 mg by mouth daily.    [provider]     Allergies:     Allergies  Allergen Reactions   Sulfur Rash    Rash/flushed, blood red eyes     Physical Exam:   Vitals  Blood pressure (!) 144/118, pulse (!) 143, temperature 97.6 F (36.4 C), resp. rate 20, weight 78.9 kg, SpO2 98 %.  1.  General: axoxo3  2. Psychiatric: euthymic  3. Neurologic: cn2-12 intact, reflexes 2+ symmetric, diffuse with no clonus, motor 5/5 in all 4 ext  4. HEENMT:  Anicteric, pupils 49m right, 1.576mleft , direct, consensual, near  intact Mucous  membranes dry Neck: no jvd, no bruit  5. Respiratory : CTAB  6. Cardiovascular : Tachy irr, irr, s1, s2,   7. Gastrointestinal:  Abd: soft, nt, nd, +bs  8. Skin:  Ext: no c/c/e,  + bruise on the right side of the neck  9.Musculoskeletal:  Good ROM  No adenoapthy    Data Review:    CBC Recent Labs  Lab 08/31/18 2318  WBC 16.5*  HGB 16.8*  HCT 49.0*  PLT 151  MCV 93.7  MCH 32.1  MCHC 34.3  RDW 14.4  LYMPHSABS 1.0  MONOABS 0.7  EOSABS 0.0  BASOSABS 0.0   ------------------------------------------------------------------------------------------------------------------  Results for orders placed or performed during the hospital encounter of 08/31/18 (from the past 48 hour(s))  CBC with Differential/Platelet     Status: Abnormal   Collection Time: 08/31/18 11:18 PM  Result Value Ref Range   WBC 16.5 (H) 4.0 - 10.5 K/uL   RBC 5.23 (H) 3.87 - 5.11 MIL/uL   Hemoglobin 16.8 (H) 12.0 - 15.0 g/dL   HCT 49.0 (H) 36.0 - 46.0 %   MCV 93.7 80.0 - 100.0 fL   MCH 32.1 26.0 - 34.0 pg   MCHC 34.3 30.0 - 36.0 g/dL   RDW 14.4 11.5 - 15.5 %   Platelets 151 150 - 400 K/uL   nRBC 0.0 0.0 - 0.2 %   Neutrophils Relative % 90 %   Neutro Abs 14.8 (H) 1.7 - 7.7 K/uL   Lymphocytes Relative 6 %   Lymphs Abs 1.0 0.7 - 4.0 K/uL   Monocytes Relative 4 %   Monocytes Absolute 0.7 0.1 - 1.0 K/uL   Eosinophils Relative 0 %   Eosinophils Absolute 0.0 0.0 - 0.5 K/uL   Basophils Relative 0 %   Basophils Absolute 0.0 0.0 - 0.1 K/uL   Immature Granulocytes 0 %   Abs Immature Granulocytes 0.07 0.00 - 0.07 K/uL    Comment: Performed at [facility], [address] [place], [place] [address]  Comprehensive metabolic panel     Status: Abnormal   Collection Time: 08/31/18 11:18 PM  Result Value Ref Range   Sodium 135 135 - 145 mmol/L   Potassium 5.3 (H) 3.5 - 5.1 mmol/L   Chloride 100 98 - 111 mmol/L   CO2 18 (L) 22 - 32 mmol/L   Glucose, Bld 433 (H) 70 - 99 mg/dL   BUN 67 (H) 8 -  23 mg/dL   Creatinine, Ser 1.73 (H) 0.44 - 1.00 mg/dL   Calcium 9.6 8.9 - 10.3 mg/dL   Total Protein 7.1 6.5 - 8.1 g/dL   Albumin 3.0 (L) 3.5 - 5.0 g/dL   AST 121 (H) 15 - 41 U/L   ALT 109 (H) 0 - 44 U/L   Alkaline Phosphatase 65 38 - 126 U/L   Total Bilirubin QUANTITY NOT SUFFICIENT, UNABLE TO PERFORM TEST 0.3 - 1.2 mg/dL   GFR calc non Af Amer 27 (L) >60 mL/min   GFR calc Af Amer 32 (L) >60 mL/min   Anion gap 17 (H) 5 - 15    Comment: Performed at [facility], [place] [address], [place], [place] [address]  CK     Status: None   Collection Time: 08/31/18 11:18 PM  Result Value Ref Range   Total CK QUANTITY NOT SUFFICIENT, UNABLE TO PERFORM TEST 38 - 234 U/L    Comment: Performed at [facility] [address] [place], [place] [address]  SARS Coronavirus 2 (CEPHEID - Performed in [facility] hospital lab), Hosp Order     Status: None   Collection Time: 09/01/18  2:00 AM   Specimen: Nasopharyngeal Swab  Result Value Ref Range   SARS Coronavirus 2 NEGATIVE NEGATIVE    Comment: (NOTE) If result is NEGATIVE SARS-CoV-2 target nucleic acids are NOT DETECTED. The SARS-CoV-2 RNA is generally detectable in upper and lower  respiratory specimens during the acute phase of infection. The lowest  concentration of SARS-CoV-2 viral copies this assay can detect is 250  copies / mL. A negative result does not preclude SARS-CoV-2 infection  and should not be used as the sole basis for treatment or other  patient management decisions.  A negative result may occur with  improper specimen collection / handling, submission of specimen other  than nasopharyngeal swab, presence of viral mutation(s) within the  areas targeted by this assay, and inadequate number of viral copies  (<250 copies / mL). A negative result must be combined with clinical  observations, patient history, and epidemiological information. If result is POSITIVE SARS-CoV-2 target nucleic acids are DETECTED. The  SARS-CoV-2 RNA is generally detectable in upper and lower  respiratory specimens dur ing the acute phase of infection.  Positive  results are indicative of active infection with SARS-CoV-2.  Clinical  correlation with patient history and other diagnostic information is  necessary to determine patient infection status.  Positive results do  not rule out bacterial infection or co-infection with other viruses. If result is PRESUMPTIVE POSTIVE SARS-CoV-2 nucleic acids MAY BE PRESENT.   A presumptive positive result was obtained on the submitted specimen  and confirmed on repeat testing.  While 2019 novel coronavirus  (SARS-CoV-2) nucleic acids may be present in the submitted sample  additional confirmatory testing may be necessary for epidemiological  and / or clinical management purposes  to differentiate between  SARS-CoV-2 and other Sarbecovirus currently known to infect humans.  If clinically indicated additional testing with an alternate test  methodology [phone]) is advised. The SARS-CoV-2 RNA is generally  detectable in upper and lower respiratory sp ecimens during the acute  phase of infection. The expected result is Negative. Fact Sheet for Patients:  StrictlyIdeas.no Fact Sheet for Healthcare Providers: BankingDealers.co.za This test is not yet approved or cleared by the [place] FDA and has been authorized for detection and/or diagnosis of SARS-CoV-2 by FDA under an Emergency Use Authorization (EUA).  This EUA will remain in effect (meaning this test can be used) for the duration of the COVID-19 declaration under Section 564(b)(1) of the Act, 21 U.S.C. section 360bbb-3(b)(1), unless the authorization is terminated or revoked sooner. Performed at [facility], [place] [address] [place], [place] [address]   Urinalysis, Routine w reflex microscopic     Status: Abnormal   Collection Time: 09/01/18  2:22 AM  Result Value  Ref Range   Color, Urine YELLOW YELLOW   APPearance CLOUDY (A) CLEAR   Specific Gravity, Urine 1.020 1.005 - 1.030   pH 5.0 5.0 - 8.0   Glucose, UA >=500 (A) NEGATIVE mg/dL   Hgb urine dipstick NEGATIVE NEGATIVE   Bilirubin Urine NEGATIVE NEGATIVE   Ketones, ur 20 (A) NEGATIVE mg/dL   Protein, ur 30 (A) NEGATIVE mg/dL   Nitrite NEGATIVE NEGATIVE   Leukocytes,Ua NEGATIVE NEGATIVE   RBC / HPF 6-10 0 - 5 RBC/hpf   WBC, UA 6-10 0 - 5 WBC/hpf   Bacteria, UA RARE (A) NONE SEEN   Squamous  Epithelial / LPF 6-10 0 - 5   Mucus PRESENT    Hyaline Casts, UA PRESENT     Comment: Performed at [facility], [place] [address] [place], [place] [address]    Chemistries  Recent Labs  Lab 08/31/18 2318  NA 135  K 5.3*  CL 100  CO2 18*  GLUCOSE 433*  BUN 67*  CREATININE 1.73*  CALCIUM 9.6  AST 121*  ALT 109*  ALKPHOS 65  BILITOT QUANTITY NOT SUFFICIENT, UNABLE TO PERFORM TEST   ------------------------------------------------------------------------------------------------------------------  ------------------------------------------------------------------------------------------------------------------ GFR: CrCl cannot be calculated (Unknown ideal weight.). Liver Function Tests: Recent Labs  Lab 08/31/18 2318  AST 121*  ALT 109*  ALKPHOS 65  BILITOT QUANTITY NOT SUFFICIENT, UNABLE TO PERFORM TEST  PROT 7.1  ALBUMIN 3.0*   No results for input(s): LIPASE, AMYLASE in the last 168 hours. No results for input(s): AMMONIA in the last 168 hours. Coagulation Profile: No results for input(s): INR, PROTIME in the last 168 hours. Cardiac Enzymes: Recent Labs  Lab 08/31/18 2318  CKTOTAL QUANTITY NOT SUFFICIENT, UNABLE TO PERFORM TEST   BNP (last 3 results) No results for input(s): PROBNP in the last 8760 hours. HbA1C: No results for input(s): HGBA1C in the last 72 hours. CBG: No results for input(s): GLUCAP in the last 168 hours. Lipid Profile: No results for input(s):  CHOL, HDL, LDLCALC, TRIG, CHOLHDL, LDLDIRECT in the last 72 hours. Thyroid Function Tests: No results for input(s): TSH, T4TOTAL, FREET4, T3FREE, THYROIDAB in the last 72 hours. Anemia Panel: No results for input(s): VITAMINB12, FOLATE, FERRITIN, TIBC, IRON, RETICCTPCT in the last 72 hours.  --------------------------------------------------------------------------------------------------------------- Urine analysis:    Component Value Date/Time   COLORURINE YELLOW 09/01/2018 0222   APPEARANCEUR CLOUDY (A) 09/01/2018 0222   LABSPEC 1.020 09/01/2018 0222   PHURINE 5.0 09/01/2018 0222   GLUCOSEU >=500 (A) 09/01/2018 0222   HGBUR NEGATIVE 09/01/2018 0222   BILIRUBINUR NEGATIVE 09/01/2018 0222   KETONESUR 20 (A) 09/01/2018 0222   PROTEINUR 30 (A) 09/01/2018 0222   UROBILINOGEN 0.2 12/29/2011 1540   NITRITE NEGATIVE 09/01/2018 0222   LEUKOCYTESUR NEGATIVE 09/01/2018 0222      Imaging Results:    Dg Wrist Complete Right  Result Date: 09/01/2018 CLINICAL DATA:  78 y/o  F; right wrist pain after fall. EXAM: RIGHT WRIST - COMPLETE 3+ VIEW COMPARISON:  None. FINDINGS: There is no evidence of fracture or dislocation. There is no evidence of arthropathy or other focal bone abnormality. Soft tissues are unremarkable. IMPRESSION: No acute fracture or dislocation identified. Electronically Signed   By: Kristine Garbe M.D.   On: 09/01/2018 00:57   Ct Head Wo Contrast  Result Date: 09/01/2018 CLINICAL DATA:  Today. Large forehead swelling. EXAM: CT HEAD WITHOUT CONTRAST CT CERVICAL SPINE WITHOUT CONTRAST TECHNIQUE: Multidetector CT imaging of the head and cervical spine was performed following the standard protocol without intravenous contrast. Multiplanar CT image reconstructions of the cervical spine were also generated. COMPARISON:  Head CT without contrast 06/18/2011 FINDINGS: CT HEAD FINDINGS Brain: Cerebral volume is within normal limits for age. There is chronic subcortical white  matter hypodensity at the anterior left operculum which is stable (series 4, image 22). Mild chronic overlying cortical encephalomalacia. Otherwise normal Gray-white matter differentiation throughout the brain. No midline shift, ventriculomegaly, mass effect, evidence of mass lesion, intracranial hemorrhage or evidence of cortically based acute infarction. Vascular: Mild for age Calcified atherosclerosis at the skull base. No suspicious intracranial vascular hyperdensity. Skull: Stable and intact. Sinuses/Orbits: Mild frontoethmoidal  fluid and bubbly opacity. Mild maxillary mucosal thickening. Other paranasal sinuses are well pneumatized. There is trace bilateral mastoid fluid. Other: Broad-based right side scalp hematoma up to 8 millimeters in thickness. Hematoma tracks anteriorly to the right forehead. New left forehead soft tissue scarring since 2013. Orbits soft tissues appears stable and negative. Mild right premalar soft tissue stranding. CT CERVICAL SPINE FINDINGS Alignment: Straightening of cervical lordosis. Cervicothoracic junction alignment is within normal limits. Bilateral posterior element alignment is within normal limits. Skull base and vertebrae: Visualized skull base is intact. No atlanto-occipital dissociation. Ankylosis of the left C2-C3 posterior elements. Interbody ankylosis at C5-C6, appears acquired rather than congenital. Developing ankylosis at C7-T1 including the left posterior element. No acute osseous abnormality identified. Soft tissues and spinal canal: No prevertebral fluid or swelling. No visible canal hematoma. Asymmetric soft tissue stranding in the right neck and supraclavicular fossa (series 9, image 57). No discrete hematoma. Partially retropharyngeal course of the carotids, normal variant. Disc levels: Widespread cervical spine degeneration. Mild spinal stenosis at least at C3-C4 and C6-C7. Upper chest: Visible upper thoracic levels appear intact. The right neck soft tissue  swelling continues to the anterior right chest wall. IMPRESSION: 1. Right scalp hematoma without skull fracture. 2. Stable since 2013 non contrast CT appearance of the brain. Minimal encephalomalacia at the anterior left operculum. 3. Probably posttraumatic soft tissue stranding/contusion in the right neck, but no discrete neck hematoma. 4. No acute traumatic injury identified in the cervical spine. 5. 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 Electronically Signed   By: Genevie Ann M.D.   On: 09/01/2018 00:03   Ct Cervical Spine Wo Contrast  Result Date: 09/01/2018 CLINICAL DATA:  Today. Large forehead swelling. EXAM: CT HEAD WITHOUT CONTRAST CT CERVICAL SPINE WITHOUT CONTRAST TECHNIQUE: Multidetector CT imaging of the head and cervical spine was performed following the standard protocol without intravenous contrast. Multiplanar CT image reconstructions of the cervical spine were also generated. COMPARISON:  Head CT without contrast 06/18/2011 FINDINGS: CT HEAD FINDINGS Brain: Cerebral volume is within normal limits for age. There is chronic subcortical white matter hypodensity at the anterior left operculum which is stable (series 4, image 22). Mild chronic overlying cortical encephalomalacia. Otherwise normal Gray-white matter differentiation throughout the brain. No midline shift, ventriculomegaly, mass effect, evidence of mass lesion, intracranial hemorrhage or evidence of cortically based acute infarction. Vascular: Mild for age Calcified atherosclerosis at the skull base. No suspicious intracranial vascular hyperdensity. Skull: Stable and intact. Sinuses/Orbits: Mild frontoethmoidal fluid and bubbly opacity. Mild maxillary mucosal thickening. Other paranasal sinuses are well pneumatized. There is trace bilateral mastoid fluid. Other: Broad-based right side scalp hematoma up to 8 millimeters in thickness. Hematoma tracks anteriorly to the right forehead. New left forehead soft tissue  scarring since 2013. Orbits soft tissues appears stable and negative. Mild right premalar soft tissue stranding. CT CERVICAL SPINE FINDINGS Alignment: Straightening of cervical lordosis. Cervicothoracic junction alignment is within normal limits. Bilateral posterior element alignment is within normal limits. Skull base and vertebrae: Visualized skull base is intact. No atlanto-occipital dissociation. Ankylosis of the left C2-C3 posterior elements. Interbody ankylosis at C5-C6, appears acquired rather than congenital. Developing ankylosis at C7-T1 including the left posterior element. No acute osseous abnormality identified. Soft tissues and spinal canal: No prevertebral fluid or swelling. No visible canal hematoma. Asymmetric soft tissue stranding in the right neck and supraclavicular fossa (series 9, image 57). No discrete hematoma. Partially retropharyngeal course of the carotids, normal variant. Disc levels: Widespread cervical spine degeneration.  Mild spinal stenosis at least at C3-C4 and C6-C7. Upper chest: Visible upper thoracic levels appear intact. The right neck soft tissue swelling continues to the anterior right chest wall. IMPRESSION: 1. Right scalp hematoma without skull fracture. 2. Stable since 2013 non contrast CT appearance of the brain. Minimal encephalomalacia at the anterior left operculum. 3. Probably posttraumatic soft tissue stranding/contusion in the right neck, but no discrete neck hematoma. 4. No acute traumatic injury identified in the cervical spine. 5. 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 Electronically Signed   By: Genevie Ann M.D.   On: 09/01/2018 00:03   Dg Pelvis Portable  Result Date: 08/31/2018 CLINICAL DATA:  Fall 2 days ago with pelvic pain, initial encounter EXAM: PORTABLE PELVIS 1-2 VIEWS COMPARISON:  None. FINDINGS: Degenerative changes of the lumbar spine are seen. Degenerative changes of the hip joints are noted bilaterally. No soft tissue  abnormality is seen. No fractures are seen. IMPRESSION: No acute abnormality noted. Electronically Signed   By: Inez Catalina M.D.   On: 08/31/2018 23:57   Dg Chest Port 1 View  Result Date: 08/31/2018 CLINICAL DATA:  Recent fall with chest pain, initial encounter EXAM: PORTABLE CHEST 1 VIEW COMPARISON:  None. FINDINGS: Cardiac shadow is at the upper limits of normal in size. Aortic calcifications are seen. The lungs are well aerated without focal infiltrate or sizable effusion. No bony abnormality is seen. IMPRESSION: No acute abnormality noted. Electronically Signed   By: Inez Catalina M.D.   On: 08/31/2018 23:58   Dg Knee Right Port  Result Date: 08/31/2018 CLINICAL DATA:  Recent fall with right knee pain, initial encounter EXAM: PORTABLE RIGHT KNEE - 2 VIEW COMPARISON:  None. FINDINGS: Degenerative changes of the right knee joint are seen. No sizable effusion is noted. No acute fracture or dislocation is seen. IMPRESSION: Degenerative change without acute abnormality. Electronically Signed   By: Inez Catalina M.D.   On: 08/31/2018 23:57       Assessment & Plan:    Principal Problem:   DKA (diabetic ketoacidoses) ([place]) Active Problems:   CAD (coronary artery disease)   Chronic systolic CHF (congestive heart failure) (HCC)   Chronic atrial fibrillation   ARF (acute renal failure) ([facility])   Atrial fibrillation with RVR ([facility])   Hyperkalemia   Abnormal liver function   DKA NPO Ns iv Insulin iv Bmp q4h x5  Rhabdomyolysis Hydrate with ns iv Check cpk in am  Afib with RVR cardizem GTT Eliquis pharmacy to dose  ARF Hydrate with ns iv Check cmp in am Check renal ultrasound  Dm2 / neuropathy Cont Gabapentin at 358m po bid due to ARF Hold Metformin Hold Lantus Insulin iv as above  CAD/ CHF Cont Toprol XL 262mpo qday Cont Lasix 207mo qday Hold Crestor due to Abnormal liver function,  rhabdo  Abnormal liver function Check acute hepatitis panel Check RUQ  ultrasound Check cmp in am  Gout Decrease Allopurinol to 100m28m qday  Hyperkalemia Hydrate with ns iv Check bmp in 4 hours   DVT Prophylaxis-   Eliquis  SCD  AM Labs Ordered, also please review Full Orders  Family Communication: Admission, patients condition and plan of care including tests being ordered have been discussed with the patient  who indicate understanding and agree with the plan and Code Status.  Code Status:  FULL CODE,  D/w her son, [facility] with ns iv, and risk of  this would be CHF.    Admission status:  /Inpatient: Based on patients clinical presentation and evaluation of above clinical data, I have made determination that patient meets Inpatient criteria at this time.   Pt has high risk of clinical deterioration,  Pt has DKA and will require iv fluids, and iv insulin,  Pt wil also need iv fluids for dehydration and rhabdomyolysis and ARF.    Time spent in minutes : 60 minutes critical care   Jani Gravel M.D on 09/01/2018 at 3:35 AM

## 2018-09-01 NOTE — ED Notes (Signed)
Pt son called Suezanne Jacquet); requesting an update on the pts status.

## 2018-09-01 NOTE — Progress Notes (Signed)
Inpatient Diabetes Program Recommendations  AACE/ADA: New Consensus Statement on Inpatient Glycemic Control (2015)  Target Ranges:  Prepandial:   less than 140 mg/dL      Peak postprandial:   less than 180 mg/dL (1-2 hours)      Critically ill patients:  140 - 180 mg/dL    Review of Glycemic Control  Diabetes history: DM 2 Outpatient Diabetes medications: Lantus 8 units bid, Metformin 500 mg bid Current orders for Inpatient glycemic control: IV insulin gtt  Inpatient Diabetes Program Recommendations:    Consult for DKA. Rounded on patient. Patient lethargic. Patient found down at home for possibly a few days. She was unable to take her usual medications at that time. Her A1c was 8.1% this admission, which based on her age is almost at goal with a less aggressive A1c <8% (ADA Standards of care). Patient with Rhabdomyolysis and elevated Lactic Acid.   At time of transition please consider Lantus 5 units Daily, Novolog 0-9 units tid + Novolog 0-5 units qhs.  Will continue to follow.  Thanks,  Tama Headings RN, MSN, BC-ADM Inpatient Diabetes Coordinator Team Pager [phone] (8a-5p)

## 2018-09-01 NOTE — ED Notes (Signed)
ultrasoound here at bedside

## 2018-09-01 NOTE — Progress Notes (Signed)
PROGRESS NOTE    Teresa Peck  HGD:[id number] DOB: Jan 23, 1941 DOA: 08/31/2018 PCP: Deland Pretty, MD    Brief Narrative:   Teresa Peck  is a 78 y.o. female with past medical history remarkable for persistent atrial fibrillation, CAD with occluded LAD status post PCI/stent to first diagonal, chronic systolic congestive heart failure, essential hypertension, type 2 diabetes mellitus and osteoarthritis who was apparently found down on the floor by family secondary to mechanical fall.  She was apparently found in the bathroom and may have been there for up to 2-3 days.  Upon EMS arrival, patient was coherent and answers questions appropriately.  She had been constipated recently and taking stool softeners with multiple trips to the bathroom that evening and she apparently slipped and fell in her bathroom where she was found.  In the ED, patient was noted to have WBC count of 16.5, hemoglobin 16.8, platelets 151, sodium 135, potassium 5.3, BUN 67, creatinine 1.73, glucose 433, anion gap 17, bicarb 18, AST 121, ALT 109, alk phos 65.  Patient was given 1 L normal saline bolus.  Patient was referred for admission by EDP for DKA, mild rhabdomyolysis, and A. fib with RVR.  Assessment & Plan:   Principal Problem:   DKA (diabetic ketoacidoses) ([place]) Active Problems:   CAD (coronary artery disease)   Chronic systolic CHF (congestive heart failure) (HCC)   Chronic atrial fibrillation   ARF (acute renal failure) (HCC)   Atrial fibrillation with RVR (HCC)   Hyperkalemia   Abnormal liver function   Diabetic ketoacidosis High anion gap metabolic acidosis Hx D6QI Patient presenting after being found down in her bathroom for likely 2-3 days, likely not taking her home medications during this timeframe.  Patient on metformin 500 mg p.o. twice daily and Lantus 8 units [place] [facility] BID at home.  Glucose elevated to 433 on admission with an anion gap of 17 and a CO2 of 18.  Urinalysis with 20 ketones.  Chest  x-ray clear, UA unrevealing; unlikely infectious etiology. --Hemoglobin A1c 8.1; fairly well controlled outpatient --Continue DKA protocol with insulin drip; will maintain until anion gap is closed x2 --Continue IV fluid hydration in accordance with protocol --BMP every 4 hours --Diabetic educator consult --Supportive care  Persistent atrial fibrillation with RVR Follows with cardiology outpatient, Dr. Acie Fredrickson.  On metoprolol succinate 25 mg p.o. daily and Eliquis 5 mg p.o. twice daily outpatient.  Etiology of her elevated heart rate likely secondary to not taking her medications for the past few days as above. --Resume metoprolol succinate 25 mg p.o. daily --Continue to Cardizem drip, continue to titrate to maintain HR less than 100 --Continue chronic anticoagulation with Eliquis 5 mg p.o. twice daily --Continue to monitor on telemetry  Mild rhabdomyolysis Mechanical fall CK elevated at 1562.  Patient reports slipped in her bathroom and was unable to get up.  Daughter-in-law reports that she was likely down for 2-3 days.  Etiology likely prolonged downtime. --Continue gentle IV fluid hydration --Repeat CK level in the a.m. --PT/OT consultation, likely tomorrow  Acute renal failure Creatinine noted to be elevated to 1.73 at time of admission.  Etiology likely secondary to prerenal azotemia versus ATN from demand ischemia secondary to A. fib with RVR as above.  It was 1.31 on 02/26/2018.  Renal ultrasound with symmetric renal atrophy, no hydronephrosis or other reversible finding. --Creatinine 1.73-->1.48-->1.48 --Continue gentle IV fluid hydration as above --Avoid nephrotoxins, renally dose all medication --Monitor urinary output --Continue to monitor renal function daily  Elevated  troponin Troponin 56, 66.  Etiology likely demand ischemia from underlying A. fib with RVR and a mild rhabdomyolysis. --Continue treatment of underlying A. fib with RVR as above --Monitor on  telemetry  Transaminitis Patient with elevated AST/ALT on admission.  Etiology likely from mild shock liver from underlying A. fib with RVR and prolonged downtime. --LFTs improving --supportive care --Repeat CMP in a.m.  Essential hypertension Chronic systolic congestive heart failure; appears compensated CAD Patient follows with cardiology outpatient, Dr. Acie Fredrickson.  Patient with history of LAD occlusion with stenting to first diagonal.  Blood pressure well controlled 120/59; although patient noted to be in A. fib with RVR as above secondary to being without her medications over the past 2-3 days.  Last TTE October 2017 with EF 50-55%. --Continue home metoprolol succinate 25 mg p.o. daily --Continue statin --Repeat echocardiogram pending   DVT prophylaxis: Eliquis Code Status: Full code Family Communication: Updated patient's daughter-in-law, Caren Griffins by telephone this morning regarding diagnosis and plan of care Disposition Plan: Remain inpatient, pending PT/OT evaluation, to be determined   Consultants:   None  Procedures:   Transthoracic echocardiogram: Pending  Antimicrobials:   None   Subjective:  Patient seen and examined at bedside, resting comfortably.  Notable ecchymosis to right side of face and right neck.  Patient states recalls slipping in the bathroom while wearing socks during the nighttime.  Complains of some muscle aches.  No other complaints at this time.  Denies headache, no fever/chills/night sweats, no nausea vomiting/diarrhea, no chest pain, palpitations, no shortness of breath, no abdominal pain.  No acute events overnight per nursing staff.  Continues on insulin drip with anion gap acidosis.  Objective: Vitals:   09/01/18 0300 09/01/18 0330 09/01/18 0400 09/01/18 0446  BP: 124/64 98/70 (!) 106/59 (!) 120/59  Pulse:    (!) 121  Resp: 14 14 18    Temp:    98 F (36.7 C)  TempSrc:    Oral  SpO2:    98%  Weight:    74.2 kg    Intake/Output Summary  (Last 24 hours) at 09/01/2018 1149 Last data filed at 09/01/2018 0954 Gross per 24 hour  Intake 1000 ml  Output --  Net 1000 ml   Filed Weights   08/31/18 2229 09/01/18 0446  Weight: 78.9 kg 74.2 kg    Examination:  General exam: Appears calm and comfortable \ HEENT: Ecchymosis noted to right face and right neck Respiratory system: Clear to auscultation. Respiratory effort normal.  On room air Cardiovascular system: Irregularly irregular rhythm, normal rate. No JVD, murmurs, rubs, gallops or clicks. No pedal edema. Gastrointestinal system: Abdomen is nondistended, soft and nontender. No organomegaly or masses felt. Normal bowel sounds heard. Central nervous system: Alert and oriented. No focal neurological deficits. Extremities: Symmetric 5 x 5 power. Skin: No rashes, lesions or ulcers Psychiatry: Judgement and insight appear normal. Mood & affect appropriate.     Data Reviewed: I have personally reviewed following labs and imaging studies  CBC: Recent Labs  Lab 08/31/18 2318 09/01/18 0345  WBC 16.5* 15.5*  NEUTROABS 14.8*  --   HGB 16.8* 15.2*  HCT 49.0* 45.4  MCV 93.7 96.2  PLT 151 165   Basic Metabolic Panel: Recent Labs  Lab 08/31/18 2318 09/01/18 0345 09/01/18 0621  NA 135 137 138  K 5.3* 4.6 4.7  CL 100 102 103  CO2 18* 20* 19*  GLUCOSE 433* 395* 379*  BUN 67* 66* 67*  CREATININE 1.73* 1.48* 1.48*  CALCIUM 9.6 9.1  9.1   GFR: CrCl cannot be calculated (Unknown ideal weight.). Liver Function Tests: Recent Labs  Lab 08/31/18 2318 09/01/18 0345  AST 121* 90*  ALT 109* 89*  ALKPHOS 65 57  BILITOT QUANTITY NOT SUFFICIENT, UNABLE TO PERFORM TEST 1.4*  PROT 7.1 6.0*  ALBUMIN 3.0* 2.6*   No results for input(s): LIPASE, AMYLASE in the last 168 hours. No results for input(s): AMMONIA in the last 168 hours. Coagulation Profile: No results for input(s): INR, PROTIME in the last 168 hours. Cardiac Enzymes: Recent Labs  Lab 08/31/18 2318  09/01/18 0310  CKTOTAL QUANTITY NOT SUFFICIENT, UNABLE TO PERFORM TEST 1,562*   BNP (last 3 results) No results for input(s): PROBNP in the last 8760 hours. HbA1C: Recent Labs    09/01/18 0345  HGBA1C 8.1*   CBG: Recent Labs  Lab 09/01/18 0642 09/01/18 0744 09/01/18 0906 09/01/18 1019 09/01/18 1133  GLUCAP 319* 290* 220* 163* 99   Lipid Profile: No results for input(s): CHOL, HDL, LDLCALC, TRIG, CHOLHDL, LDLDIRECT in the last 72 hours. Thyroid Function Tests: Recent Labs    09/01/18 0345  TSH 0.693   Anemia Panel: No results for input(s): VITAMINB12, FOLATE, FERRITIN, TIBC, IRON, RETICCTPCT in the last 72 hours. Sepsis Labs: No results for input(s): PROCALCITON, LATICACIDVEN in the last 168 hours.  Recent Results (from the past 240 hour(s))  SARS Coronavirus 2 (CEPHEID - Performed in [place] hospital lab), Hosp Order     Status: None   Collection Time: 09/01/18  2:00 AM   Specimen: Nasopharyngeal Swab  Result Value Ref Range Status   SARS Coronavirus 2 NEGATIVE NEGATIVE Final    Comment: (NOTE) If result is NEGATIVE SARS-CoV-2 target nucleic acids are NOT DETECTED. The SARS-CoV-2 RNA is generally detectable in upper and lower  respiratory specimens during the acute phase of infection. The lowest  concentration of SARS-CoV-2 viral copies this assay can detect is 250  copies / mL. A negative result does not preclude SARS-CoV-2 infection  and should not be used as the sole basis for treatment or other  patient management decisions.  A negative result may occur with  improper specimen collection / handling, submission of specimen other  than nasopharyngeal swab, presence of viral mutation(s) within the  areas targeted by this assay, and inadequate number of viral copies  (<250 copies / mL). A negative result must be combined with clinical  observations, patient history, and epidemiological information. If result is POSITIVE SARS-CoV-2 target nucleic acids are  DETECTED. The SARS-CoV-2 RNA is generally detectable in upper and lower  respiratory specimens dur ing the acute phase of infection.  Positive  results are indicative of active infection with SARS-CoV-2.  Clinical  correlation with patient history and other diagnostic information is  necessary to determine patient infection status.  Positive results do  not rule out bacterial infection or co-infection with other viruses. If result is PRESUMPTIVE POSTIVE SARS-CoV-2 nucleic acids MAY BE PRESENT.   A presumptive positive result was obtained on the submitted specimen  and confirmed on repeat testing.  While 2019 novel coronavirus  (SARS-CoV-2) nucleic acids may be present in the submitted sample  additional confirmatory testing may be necessary for epidemiological  and / or clinical management purposes  to differentiate between  SARS-CoV-2 and other Sarbecovirus currently known to infect humans.  If clinically indicated additional testing with an alternate test  methodology [phone]) is advised. The SARS-CoV-2 RNA is generally  detectable in upper and lower respiratory sp ecimens during the acute  phase of infection. The expected result is Negative. Fact Sheet for Patients:  StrictlyIdeas.no Fact Sheet for Healthcare Providers: BankingDealers.co.za This test is not yet approved or cleared by the [place] FDA and has been authorized for detection and/or diagnosis of SARS-CoV-2 by FDA under an Emergency Use Authorization (EUA).  This EUA will remain in effect (meaning this test can be used) for the duration of the COVID-19 declaration under Section 564(b)(1) of the Act, 21 U.S.C. section 360bbb-3(b)(1), unless the authorization is terminated or revoked sooner. Performed at [facility], [place] [address], [place], [place] [address]          Radiology Studies: Dg Wrist Complete Right  Result Date: 09/01/2018 CLINICAL DATA:  78  y/o  F; right wrist pain after fall. EXAM: RIGHT WRIST - COMPLETE 3+ VIEW COMPARISON:  None. FINDINGS: There is no evidence of fracture or dislocation. There is no evidence of arthropathy or other focal bone abnormality. Soft tissues are unremarkable. IMPRESSION: No acute fracture or dislocation identified. Electronically Signed   By: Kristine Garbe M.D.   On: 09/01/2018 00:57   Ct Head Wo Contrast  Result Date: 09/01/2018 CLINICAL DATA:  Today. Large forehead swelling. EXAM: CT HEAD WITHOUT CONTRAST CT CERVICAL SPINE WITHOUT CONTRAST TECHNIQUE: Multidetector CT imaging of the head and cervical spine was performed following the standard protocol without intravenous contrast. Multiplanar CT image reconstructions of the cervical spine were also generated. COMPARISON:  Head CT without contrast 06/18/2011 FINDINGS: CT HEAD FINDINGS Brain: Cerebral volume is within normal limits for age. There is chronic subcortical white matter hypodensity at the anterior left operculum which is stable (series 4, image 22). Mild chronic overlying cortical encephalomalacia. Otherwise normal Gray-white matter differentiation throughout the brain. No midline shift, ventriculomegaly, mass effect, evidence of mass lesion, intracranial hemorrhage or evidence of cortically based acute infarction. Vascular: Mild for age Calcified atherosclerosis at the skull base. No suspicious intracranial vascular hyperdensity. Skull: Stable and intact. Sinuses/Orbits: Mild frontoethmoidal fluid and bubbly opacity. Mild maxillary mucosal thickening. Other paranasal sinuses are well pneumatized. There is trace bilateral mastoid fluid. Other: Broad-based right side scalp hematoma up to 8 millimeters in thickness. Hematoma tracks anteriorly to the right forehead. New left forehead soft tissue scarring since 2013. Orbits soft tissues appears stable and negative. Mild right premalar soft tissue stranding. CT CERVICAL SPINE FINDINGS Alignment:  Straightening of cervical lordosis. Cervicothoracic junction alignment is within normal limits. Bilateral posterior element alignment is within normal limits. Skull base and vertebrae: Visualized skull base is intact. No atlanto-occipital dissociation. Ankylosis of the left C2-C3 posterior elements. Interbody ankylosis at C5-C6, appears acquired rather than congenital. Developing ankylosis at C7-T1 including the left posterior element. No acute osseous abnormality identified. Soft tissues and spinal canal: No prevertebral fluid or swelling. No visible canal hematoma. Asymmetric soft tissue stranding in the right neck and supraclavicular fossa (series 9, image 57). No discrete hematoma. Partially retropharyngeal course of the carotids, normal variant. Disc levels: Widespread cervical spine degeneration. Mild spinal stenosis at least at C3-C4 and C6-C7. Upper chest: Visible upper thoracic levels appear intact. The right neck soft tissue swelling continues to the anterior right chest wall. IMPRESSION: 1. Right scalp hematoma without skull fracture. 2. Stable since 2013 non contrast CT appearance of the brain. Minimal encephalomalacia at the anterior left operculum. 3. Probably posttraumatic soft tissue stranding/contusion in the right neck, but no discrete neck hematoma. 4. No acute traumatic injury identified in the cervical spine. 5. Multilevel cervical spine ankylosis and widespread  degeneration. Electronically Signed   By: Genevie Ann M.D.   On: 09/01/2018 00:03   Ct Cervical Spine Wo Contrast  Result Date: 09/01/2018 CLINICAL DATA:  Today. Large forehead swelling. EXAM: CT HEAD WITHOUT CONTRAST CT CERVICAL SPINE WITHOUT CONTRAST TECHNIQUE: Multidetector CT imaging of the head and cervical spine was performed following the standard protocol without intravenous contrast. Multiplanar CT image reconstructions of the cervical spine were also generated. COMPARISON:  Head CT without contrast 06/18/2011 FINDINGS: CT HEAD  FINDINGS Brain: Cerebral volume is within normal limits for age. There is chronic subcortical white matter hypodensity at the anterior left operculum which is stable (series 4, image 22). Mild chronic overlying cortical encephalomalacia. Otherwise normal Gray-white matter differentiation throughout the brain. No midline shift, ventriculomegaly, mass effect, evidence of mass lesion, intracranial hemorrhage or evidence of cortically based acute infarction. Vascular: Mild for age Calcified atherosclerosis at the skull base. No suspicious intracranial vascular hyperdensity. Skull: Stable and intact. Sinuses/Orbits: Mild frontoethmoidal fluid and bubbly opacity. Mild maxillary mucosal thickening. Other paranasal sinuses are well pneumatized. There is trace bilateral mastoid fluid. Other: Broad-based right side scalp hematoma up to 8 millimeters in thickness. Hematoma tracks anteriorly to the right forehead. New left forehead soft tissue scarring since 2013. Orbits soft tissues appears stable and negative. Mild right premalar soft tissue stranding. CT CERVICAL SPINE FINDINGS Alignment: Straightening of cervical lordosis. Cervicothoracic junction alignment is within normal limits. Bilateral posterior element alignment is within normal limits. Skull base and vertebrae: Visualized skull base is intact. No atlanto-occipital dissociation. Ankylosis of the left C2-C3 posterior elements. Interbody ankylosis at C5-C6, appears acquired rather than congenital. Developing ankylosis at C7-T1 including the left posterior element. No acute osseous abnormality identified. Soft tissues and spinal canal: No prevertebral fluid or swelling. No visible canal hematoma. Asymmetric soft tissue stranding in the right neck and supraclavicular fossa (series 9, image 57). No discrete hematoma. Partially retropharyngeal course of the carotids, normal variant. Disc levels: Widespread cervical spine degeneration. Mild spinal stenosis at least at C3-C4  and C6-C7. Upper chest: Visible upper thoracic levels appear intact. The right neck soft tissue swelling continues to the anterior right chest wall. IMPRESSION: 1. Right scalp hematoma without skull fracture. 2. Stable since 2013 non contrast CT appearance of the brain. Minimal encephalomalacia at the anterior left operculum. 3. Probably posttraumatic soft tissue stranding/contusion in the right neck, but no discrete neck hematoma. 4. No acute traumatic injury identified in the cervical spine. 5. 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 Electronically Signed   By: Genevie Ann M.D.   On: 09/01/2018 00:03   US Renal  Result Date: 09/01/2018 CLINICAL DATA:  Acute renal failure EXAM: RENAL / URINARY TRACT ULTRASOUND COMPLETE COMPARISON:  None. FINDINGS: Right Kidney: Renal measurements: 9.3 x 3.8 x 4.2 cm = volume: 80 mL. Cortical thinning and mild lobulation. Negative for mass or hydronephrosis. Left Kidney: Renal measurements: 8.5 x 4.8 x 4.1 cm = volume: 86 mL. Cortical thinning and mild lobulation. Negative for mass or hydronephrosis. Bladder: Appears normal for degree of bladder distention. IMPRESSION: 1. Symmetric renal atrophy. 2. No hydronephrosis or other reversible finding. Electronically Signed   By: Monte Fantasia M.D.   On: 09/01/2018 08:27   Dg Pelvis Portable  Result Date: 08/31/2018 CLINICAL DATA:  Fall 2 days ago with pelvic pain, initial encounter EXAM: PORTABLE PELVIS 1-2 VIEWS COMPARISON:  None. FINDINGS: Degenerative changes of the lumbar spine are seen. Degenerative changes of the hip joints are noted bilaterally. No soft tissue  abnormality is seen. No fractures are seen. IMPRESSION: No acute abnormality noted. Electronically Signed   By: Inez Catalina M.D.   On: 08/31/2018 23:57   Dg Chest Port 1 View  Result Date: 08/31/2018 CLINICAL DATA:  Recent fall with chest pain, initial encounter EXAM: PORTABLE CHEST 1 VIEW COMPARISON:  None. FINDINGS: Cardiac shadow is at  the upper limits of normal in size. Aortic calcifications are seen. The lungs are well aerated without focal infiltrate or sizable effusion. No bony abnormality is seen. IMPRESSION: No acute abnormality noted. Electronically Signed   By: Inez Catalina M.D.   On: 08/31/2018 23:58   Dg Knee Right Port  Result Date: 08/31/2018 CLINICAL DATA:  Recent fall with right knee pain, initial encounter EXAM: PORTABLE RIGHT KNEE - 2 VIEW COMPARISON:  None. FINDINGS: Degenerative changes of the right knee joint are seen. No sizable effusion is noted. No acute fracture or dislocation is seen. IMPRESSION: Degenerative change without acute abnormality. Electronically Signed   By: Inez Catalina M.D.   On: 08/31/2018 23:57   US Abdomen Limited Ruq  Result Date: 09/01/2018 CLINICAL DATA:  Abnormal liver function tests EXAM: ULTRASOUND ABDOMEN LIMITED RIGHT UPPER QUADRANT COMPARISON:  None. FINDINGS: Gallbladder: History of cholecystectomy. Shadowing gas is seen in the gallbladder fossa. Common bile duct: Diameter: 5 mm.  Where visualized, no filling defect. Liver: Borderline starry sky appearance which is likely technical in this case. No evidence of mass lesion. Portal vein is patent on color Doppler imaging with normal direction of blood flow towards the liver. IMPRESSION: Negative right upper quadrant ultrasound after cholecystectomy. Electronically Signed   By: Monte Fantasia M.D.   On: 09/01/2018 04:15        Scheduled Meds:  allopurinol  100 mg Oral Daily   apixaban  5 mg Oral BID   furosemide  20 mg Oral Daily   gabapentin  300 mg Oral BID   magnesium oxide  400 mg Oral Daily   metoprolol succinate  25 mg Oral Daily   Continuous Infusions:  sodium chloride 125 mL/hr at 09/01/18 0452   dextrose 5 % and 0.45% NaCl 50 mL/hr at 09/01/18 1138   diltiazem (CARDIZEM) infusion 5 mg/hr (09/01/18 0400)   insulin 1.6 Units/hr (09/01/18 1132)     LOS: 0 days    Time spent: 38 minutes    Vanna Shavers J  [place] DO [facility] Pager [phone]  If 7PM-7AM, please contact night-coverage [email or web address] Password [facility] 09/01/2018, 11:49 AM

## 2018-09-01 NOTE — Progress Notes (Signed)
  Echocardiogram 2D Echocardiogram has been performed.  Teresa Peck 09/01/2018, 10:52 AM

## 2018-09-01 NOTE — Progress Notes (Signed)
Update given to grandson, Mikeal Hawthorne, over the phone. States he is his POA, but has not have paperwork, he does plan to obtain when possible. Contact info added to patient's chart.

## 2018-09-01 NOTE — Progress Notes (Signed)
Pt complained of feeling like she needed to urinate but couldn't. Patient has purewick on. Pt bladder scanned with a residual volume of >720 ml. After bladder scan, pt began to urinate on her own. Post void residual is >550 ml. Pt now seems able to urinate on her own. Will continue to monitor. Lajoyce Corners, RN

## 2018-09-02 DIAGNOSIS — N179 Acute kidney failure, unspecified: Secondary | ICD-10-CM

## 2018-09-02 LAB — COMPREHENSIVE METABOLIC PANEL
ALT: 731 U/L — ABNORMAL HIGH (ref 0–44)
ALT: 584 U/L — ABNORMAL HIGH (ref 0–44)
AST: 1898 U/L — ABNORMAL HIGH (ref 15–41)
AST: 1134 U/L — ABNORMAL HIGH (ref 15–41)
Albumin: 2.7 g/dL — ABNORMAL LOW (ref 3.5–5.0)
Albumin: 2.3 g/dL — ABNORMAL LOW (ref 3.5–5.0)
Alkaline Phosphatase: 168 U/L — ABNORMAL HIGH (ref 38–126)
Alkaline Phosphatase: 154 U/L — ABNORMAL HIGH (ref 38–126)
Anion gap: 13 (ref 5–15)
Anion gap: 8 (ref 5–15)
BUN: 70 mg/dL — ABNORMAL HIGH (ref 8–23)
BUN: 73 mg/dL — ABNORMAL HIGH (ref 8–23)
CO2: 21 mmol/L — ABNORMAL LOW (ref 22–32)
CO2: 22 mmol/L (ref 22–32)
Calcium: 8.9 mg/dL (ref 8.9–10.3)
Calcium: 8.6 mg/dL — ABNORMAL LOW (ref 8.9–10.3)
Chloride: 103 mmol/L (ref 98–111)
Chloride: 108 mmol/L (ref 98–111)
Creatinine, Ser: 1.69 mg/dL — ABNORMAL HIGH (ref 0.44–1.00)
Creatinine, Ser: 1.82 mg/dL — ABNORMAL HIGH (ref 0.44–1.00)
GFR calc Af Amer: 33 mL/min — ABNORMAL LOW (ref >60)
GFR calc Af Amer: 30 mL/min — ABNORMAL LOW (ref >60)
GFR calc non Af Amer: 28 mL/min — ABNORMAL LOW (ref >60)
GFR calc non Af Amer: 26 mL/min — ABNORMAL LOW (ref >60)
Glucose, Bld: 226 mg/dL — ABNORMAL HIGH (ref 70–99)
Glucose, Bld: 199 mg/dL — ABNORMAL HIGH (ref 70–99)
Potassium: 3.9 mmol/L (ref 3.5–5.1)
Potassium: 4.4 mmol/L (ref 3.5–5.1)
Sodium: 137 mmol/L (ref 135–145)
Sodium: 138 mmol/L (ref 135–145)
Total Bilirubin: 3.6 mg/dL — ABNORMAL HIGH (ref 0.3–1.2)
Total Bilirubin: 3.6 mg/dL — ABNORMAL HIGH (ref 0.3–1.2)
Total Protein: 5.9 g/dL — ABNORMAL LOW (ref 6.5–8.1)
Total Protein: 5.3 g/dL — ABNORMAL LOW (ref 6.5–8.1)

## 2018-09-02 LAB — MAGNESIUM: Magnesium: 2.2 mg/dL (ref 1.7–2.4)

## 2018-09-02 LAB — CBC
HCT: 44.1 % (ref 36.0–46.0)
HCT: 39.5 % (ref 36.0–46.0)
Hemoglobin: 14.7 g/dL (ref 12.0–15.0)
Hemoglobin: 13.1 g/dL (ref 12.0–15.0)
MCH: 32.2 pg (ref 26.0–34.0)
MCH: 32.5 pg (ref 26.0–34.0)
MCHC: 33.3 g/dL (ref 30.0–36.0)
MCHC: 33.2 g/dL (ref 30.0–36.0)
MCV: 96.7 fL (ref 80.0–100.0)
MCV: 98 fL (ref 80.0–100.0)
Platelets: 167 10*3/uL (ref 150–400)
Platelets: 159 10*3/uL (ref 150–400)
RBC: 4.56 MIL/uL (ref 3.87–5.11)
RBC: 4.03 MIL/uL (ref 3.87–5.11)
RDW: 14.6 % (ref 11.5–15.5)
RDW: 14.5 % (ref 11.5–15.5)
WBC: 7 10*3/uL (ref 4.0–10.5)
WBC: 13.2 10*3/uL — ABNORMAL HIGH (ref 4.0–10.5)
nRBC: 0 % (ref 0.0–0.2)
nRBC: 0 % (ref 0.0–0.2)

## 2018-09-02 LAB — HEPATITIS PANEL, ACUTE
HCV Ab: <0.1 s/co ratio (ref 0.0–0.9)
Hep A IgM: NEGATIVE
Hep B C IgM: NEGATIVE
Hepatitis B Surface Ag: NEGATIVE

## 2018-09-02 LAB — GLUCOSE, CAPILLARY
Glucose-Capillary: 117 mg/dL — ABNORMAL HIGH (ref 70–99)
Glucose-Capillary: 164 mg/dL — ABNORMAL HIGH (ref 70–99)
Glucose-Capillary: 172 mg/dL — ABNORMAL HIGH (ref 70–99)
Glucose-Capillary: 188 mg/dL — ABNORMAL HIGH (ref 70–99)
Glucose-Capillary: 192 mg/dL — ABNORMAL HIGH (ref 70–99)
Glucose-Capillary: 93 mg/dL (ref 70–99)

## 2018-09-02 LAB — TROPONIN I (HIGH SENSITIVITY)
Troponin I (High Sensitivity): 74 ng/L — ABNORMAL HIGH (ref <18)
Troponin I (High Sensitivity): 67 ng/L — ABNORMAL HIGH (ref <18)

## 2018-09-02 LAB — PROTIME-INR
INR: 2.5 — ABNORMAL HIGH (ref 0.8–1.2)
Prothrombin Time: 26.5 seconds — ABNORMAL HIGH (ref 11.4–15.2)

## 2018-09-02 LAB — HEPARIN LEVEL (UNFRACTIONATED): Heparin Unfractionated: >2.2 IU/mL — ABNORMAL HIGH (ref 0.30–0.70)

## 2018-09-02 LAB — APTT: aPTT: 38 seconds — ABNORMAL HIGH (ref 24–36)

## 2018-09-02 LAB — CK TOTAL AND CKMB (NOT AT ARMC)
CK, MB: 13.5 ng/mL — ABNORMAL HIGH (ref 0.5–5.0)
Relative Index: 1.6 (ref 0.0–2.5)
Total CK: 824 U/L — ABNORMAL HIGH (ref 38–234)

## 2018-09-02 LAB — LACTIC ACID, PLASMA: Lactic Acid, Venous: 1.2 mmol/L (ref 0.5–1.9)

## 2018-09-02 LAB — MRSA PCR SCREENING: MRSA by PCR: NEGATIVE

## 2018-09-02 MED ORDER — LACTATED RINGERS IV SOLN
INTRAVENOUS | Status: DC
  Administered 2018-09-02 – 2018-09-04 (×2): via INTRAVENOUS

## 2018-09-02 MED ORDER — WHITE PETROLATUM EX OINT
TOPICAL_OINTMENT | CUTANEOUS | Status: AC
  Administered 2018-09-03: 11:00:00
  Filled 2018-09-02: qty 28.35

## 2018-09-02 MED ORDER — SODIUM CHLORIDE 0.9 % IV BOLUS
1000.0000 mL | Freq: Once | INTRAVENOUS | Status: AC
  Administered 2018-09-02: 1000 mL via INTRAVENOUS

## 2018-09-02 MED ORDER — DIGOXIN 0.25 MG/ML IJ SOLN
0.5000 mg | Freq: Once | INTRAMUSCULAR | Status: AC
  Administered 2018-09-02: 0.5 mg via INTRAVENOUS
  Filled 2018-09-02: qty 2

## 2018-09-02 MED ORDER — NALOXONE HCL 0.4 MG/ML IJ SOLN
INTRAMUSCULAR | Status: AC
  Administered 2018-09-02: 10:00:00
  Filled 2018-09-02: qty 1

## 2018-09-02 MED ORDER — HEPARIN (PORCINE) 25000 UT/250ML-% IV SOLN
800.0000 [IU]/h | INTRAVENOUS | Status: DC
  Administered 2018-09-02: 950 [IU]/h via INTRAVENOUS
  Filled 2018-09-02: qty 250

## 2018-09-02 MED ORDER — CHLORHEXIDINE GLUCONATE CLOTH 2 % EX PADS
6.0000 | MEDICATED_PAD | Freq: Every day | CUTANEOUS | Status: DC
  Administered 2018-09-03 – 2018-09-05 (×3): 6 via TOPICAL

## 2018-09-02 MED ORDER — INSULIN ASPART 100 UNIT/ML ~~LOC~~ SOLN: 1.0000 [IU] | SUBCUTANEOUS | Status: DC

## 2018-09-02 MED ORDER — PHENYLEPHRINE HCL-NACL 10-0.9 MG/250ML-% IV SOLN
0.0000 ug/min | INTRAVENOUS | Status: DC
  Filled 2018-09-02: qty 250

## 2018-09-02 MED ORDER — METOPROLOL TARTRATE 12.5 MG HALF TABLET: 12.5000 mg | ORAL_TABLET | Freq: Two times a day (BID) | ORAL | Status: DC

## 2018-09-02 MED FILL — Medication: Qty: 1 | Status: AC

## 2018-09-02 NOTE — Progress Notes (Signed)
eLink Physician-Brief Progress Note Patient Name: Teresa Peck DOB: [birth year] MRN: [id number]   Date of Service  09/02/2018  HPI/Events of Note  Pt on Apixaban for afib but now has acute kidney and liver injuries, INR is 2.5, pharmacist asking if pt should be switched to Heparin bridge  eICU Interventions  Okay given to pharmacist for switch to Heparin bridge and pharmacy consulted for dosing management.        Kerry Kass [name] 09/02/2018, 8:24 PM

## 2018-09-02 NOTE — Progress Notes (Signed)
ANTICOAGULATION CONSULT NOTE - Initial Consult  Pharmacy Consult for Heparin Dosing Indication: atrial fibrillation  Allergies  Allergen Reactions  . Sulfur Rash    Rash/flushed, blood red eyes    Patient Measurements: Weight: 163 lb 9.3 oz (74.2 kg) Heparin Dosing Weight: 70.1 kg  Vital Signs: Temp: 97.1 F (36.2 C) (07/14 1930) Temp Source: Axillary (07/14 1930) BP: 120/87 (07/14 2000) Pulse Rate: 82 (07/14 2000)  Labs: Recent Labs    08/31/18 2318 09/01/18 0310  09/01/18 0345 09/01/18 0621  09/01/18 1940 09/02/18 0648 09/02/18 1431 09/02/18 1720  HGB 16.8*  --   --  15.2*  --   --   --  14.7 13.1  --   HCT 49.0*  --   --  45.4  --   --   --  44.1 39.5  --   PLT 151  --   --  181  --   --   --  167 159  --   LABPROT  --   --   --   --   --   --   --   --  26.5*  --   INR  --   --   --   --   --   --   --   --  2.5*  --   CREATININE 1.73*  --   --  1.48* 1.48*   < > 1.25* 1.69* 1.82*  --   CKTOTAL QUANTITY NOT SUFFICIENT, UNABLE TO PERFORM TEST 1,562*  --   --   --   --   --  824*  --   --   CKMB  --   --   --   --   --   --   --  13.5*  --   --   TROPONINIHS  --   --    < > 54* 66*  --   --   --  74* 67*   < > = values in this interval not displayed.    CrCl ~ 25, however this value is likely inaccurate due to this patient being in AKI.  Medical History: Past Medical History:  Diagnosis Date  . Anemia   . Arthritis   . CAD (coronary artery disease)   . CAD (coronary artery disease)   . Cancer ([place])    skin cancer  . CHF (congestive heart failure) ([place])   . Coronary artery disease    MI 2004-stent  . Diabetes mellitus   . Diabetic neuropathy ([place])   . GERD (gastroesophageal reflux disease)    tums  . Gout   . Hiatal hernia   . Hyperlipidemia   . Hypertension   . PAD (peripheral artery disease) ([place])   . Tremor     Medications:  Scheduled:  . allopurinol  100 mg Oral Daily  . gabapentin  300 mg Oral BID  . insulin aspart  0-5 Units  Subcutaneous QHS  . insulin aspart  0-9 Units Subcutaneous TID WC  . insulin glargine  10 Units Subcutaneous Daily  . magnesium oxide  400 mg Oral Daily    Assessment: Teresa Peck is a 78 y/o female with PMH of A.fib with RVR (on eliquis PTA), CAD s/p PCI, systolic HF, HTN, DMT2, and osteoarthritis who presented after being down on the floor for 2-3 days. Upon admission, pt was found to have DKA, atrial fib with RVR, mild rhabdomyolysis, elevated LFTs, hypotension, right scalp hematoma, and AMS. Patient's eliquis was  held due to shock liver and AKI, which will cause accumulation of the drug. Heparin will be started to anticoagulate the patient until kidney and liver function improve.   CHA2DVAS2c score: 7  Goal of Therapy:  Heparin level 0.3-0.7 units/ml aPTT 66-102 seconds Monitor platelets by anticoagulation protocol: Yes   Plan:  Check stat HL and APTT to determine baseline. Once levels comes back, next shift will address heparin drip rate.  Sherren Kerns, PharmD PGY1 Acute Care Pharmacy Resident [phone] 09/02/2018,8:30 PM

## 2018-09-02 NOTE — Consult Note (Signed)
NAME:  Teresa Peck, MRN:  [id number], DOB:  06/02/1940, LOS: 1 ADMISSION DATE:  08/31/2018, CONSULTATION DATE:  08/31/2018 REFERRING MD:  Dr. [place] CHIEF COMPLAINT:  Hypotension/ AMS   Brief History   49 yoF found down at home for estimated 2-3 days s/p mechanical fall.  Admitted with DKI, atrial fib with RVR, mild rhabdomyolysis and transaminitis with worsening hypotension and altered mental status 7/14.  PCCM consulted.  History of present illness   HPI obtained from medical chart review as patient is encephalopathic.   78 year old female with history of Afib on Eliquis, CAD w/occluded LAD s/p PCI, systolic HF, HTN, DMT2, and osteoarthritis presenting from home 7/13 after being found down on floor for suspected 2-3 days, unable to take any of her home medications, after mechanical fall as patient has been constipated and taking multiple trips to bathroom after taking stool softeners.  Upon arrival to hospital, she was coherent and answering questions appropriately.  CT head and cervical spine noted for right scalp hematoma, otherwise without acute process.    She was admitted to [facility] with DKA, rhabdomyolysis, AKI, afib with RVR requiring cardizem drip, and Transaminitis.  She was treated with IV fluids and insulin drip till her anion gap closed then transitioned off.  She was placed on a cardizem drip for her atrial fib with RVR and started on digoxin.  Cardizem drip off 7/13 evening.  Her Eliquis was continued.  Of note, her repeat echo did show decrease in EF from 50-55% in 11/2015 to now 30-35%.  Her hs-troponin trend has been flat thus far.  On the morning of 7/14, she was found to have worsening hypotension and mental status.  She had received oral Vicodin twice overnight.  Additionally, labs noted for worsening renal function and hepatic panel.  Rapid response was called to the bedside, where her mental status and hypotension improved after narcan and 1L NS bolus.  PCCM consulted for  further medical management and possible transfer to ICU.   Past Medical History  Afib on Eliquis, CAD w/occluded LAD s/p PCI, systolic HF, HTN, DMT2, osteoarthritis  Significant Hospital Events   7/13 Admit  Consults:  PCCM  Procedures:    Significant Diagnostic Tests:  7/12 CT head/ cervical >> 1. Right scalp hematoma without skull fracture. 2. Stable since 2013 non contrast CT appearance of the brain. Minimal encephalomalacia at the anterior left operculum. 3. Probably posttraumatic soft tissue stranding/contusion in the right neck, but no discrete neck hematoma. 4. No acute traumatic injury identified in the cervical spine. 5. 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  7/13 TTE >>  (EF down from 50-55% in 11/2015) 1. The left ventricle has moderate-severely reduced systolic function, with an ejection fraction of 30-35%. The cavity size was normal. Left ventricular diastolic Doppler parameters are indeterminate. Technically difficult study with poor windows to  assess wall motion. Definity may have helped but was not used. The mid to apical anteroseptal and anterior walls as well as the apical inferior wall and the true apex appeared akinetic. 2. The right ventricle has normal systolic function. The cavity was normal. There is no increase in right ventricular wall thickness. 3. Left atrial size was mildly dilated. 4. Trivial pericardial effusion is present. 5. The mitral valve is degenerative. Moderate calcification of the mitral valve leaflet. There is moderate mitral annular calcification present. No evidence of mitral valve stenosis. Trivial mitral regurgitation. 6. The aortic valve is tricuspid. Moderate calcification of the aortic valve.  No stenosis of the aortic valve. 7. The ascending aorta and aortic root are normal in size and structure. 8. Normal IVC size. PA systolic pressure 27 mmHg.  Micro Data:  7/13 SARS coronavirus 2 >> negative    Antimicrobials:  None   Interim history/subjective:  S/p narcan 0.4 mg and 1L NS with rapid response RN with improvement in mental status and blood pressure  Objective   Blood pressure (!) 85/41, pulse (!) 112, temperature 98.9 F (37.2 C), temperature source Oral, resp. rate 12, weight 74.2 kg, SpO2 92 %.        Intake/Output Summary (Last 24 hours) at 09/02/2018 1040 Last data filed at 09/02/2018 0630 Gross per 24 hour  Intake 2477.6 ml  Output 1400 ml  Net 1077.6 ml   Filed Weights   08/31/18 2229 09/01/18 0446  Weight: 78.9 kg 74.2 kg    Examination: General: Elderly female supine in bed , awake and alert to herself and place, In NAD HENT: NCAT, No LAD, Bruising per right neck and forehead Lungs: Bilateral chest excursion, Clear throughout , diminished per bases Cardiovascular: S1, S1, Irr, No RMG Abdomen: Soft, + Tender, Non-distended, BS diminished, Body mass index is 28.08 kg/m. Extremities: Warm and dry, no obvious deformities, No edema Neuro: Awake and alert, Follows commands, Oriented to person and place, not time.L eye lid droop. GU: Foley cath  [facility] Problem list     Assessment & Plan:  Hypotension Had received Norco at 7 am Resolved with narcan 0.4 Plan Transfer to ICU for monitoring Tele Minimize sedation MAP goal of > 65 Hold Lasix Pressors as needed to maintain above MAP   AMS after hypotensive event 7/14 CT head with  Right scalp hematoma without skull fracture. Probably posttraumatic soft tissue stranding/contusion in the right neck, but no discrete neck hematoma. L eye droop>> ? 3rd nerve palsy 2/2 inflammation States she is seeing double Plan Consider MRI brain Consider Neuro consult Neuro checks per unit routine Supportive care Frequent orientation Minimize sedation  DKA on admission Initial Glucose 433 Anion Gap acidosis >> resolved Gap is closed Plan CBG's Q 4 SSI Trend BMET Consider A1C  Atrial  Fibrillation with RVR Was given Digoxin 7/13 ( 0.5 mg) Cardizem gtt d/c'd 7/13  Chronic anticoagulation  Eliquis  Echo shows drop in EF from 50-55% in 2017 to 30-35% on 7/13 Troponin HS of 66 on admission, ? Demand ischemia Hypomag Plan Transfer to ICU Telemetry Hold metoprolol today Trend Troponins 12 Lead EKG Consider Cards consult Reduced dose Eliquis per pharmacy Mag > 2 Potassium > 4  Rhabdo>> fall and down x 2-3 days CK down Trending Plan IVF as cardiac status tolerated>> per primary Trend CK Monitor renal function Monitor UO   Shock Liver LFT's up trending On Eliquis Plan Trend LFT's  DC Norco No Tylenol products Hold home statin Trend coags  Consider decreasing Eliquis to 2.5 mg BID as she is > 80 and Creatinine is > 1.5  HTN Echo results noted Plan Hold home anti hypertensive ( Metoprolol)  Map Goal as above     Generalized Abdominal Pain Pending US abdomen Plan We will await results of US abdomen    Patient  has had some improvement with Narcan and IVF. Will move to ICU to monitor more closely x 24 hours.    Best practice:  Diet: Heart Healthy per Primary Pain/Anxiety/Delirium protocol (if indicated): NA VAP protocol (if indicated): NA DVT prophylaxis: Eliquis GI prophylaxis: None Glucose control: CBG's, SSI  Mobility: BR Code Status: Full Family Communication: Pending Disposition: ICU  Labs   CBC: Recent Labs  Lab 08/31/18 2318 09/01/18 0345 09/02/18 0648  WBC 16.5* 15.5* 7.0  NEUTROABS 14.8*  --   --   HGB 16.8* 15.2* 14.7  HCT 49.0* 45.4 44.1  MCV 93.7 96.2 96.7  PLT 151 181 924    Basic Metabolic Panel: Recent Labs  Lab 09/01/18 0621 09/01/18 1035 09/01/18 1514 09/01/18 1940 09/02/18 0648  NA 138 141 141 139 137  K 4.7 3.7 3.7 4.0 3.9  CL 103 109 109 108 103  CO2 19* 19* 19* 22 21*  GLUCOSE 379* 187* 145* 218* 226*  BUN 67* 64* 65* 67* 70*  CREATININE 1.48* 1.33* 1.19* 1.25* 1.69*  CALCIUM 9.1 9.0 8.6* 8.6*  8.9   GFR: CrCl cannot be calculated (Unknown ideal weight.). Recent Labs  Lab 08/31/18 2318 09/01/18 0345 09/02/18 0648  WBC 16.5* 15.5* 7.0    Liver Function Tests: Recent Labs  Lab 08/31/18 2318 09/01/18 0345 09/02/18 0648  AST 121* 90* 1,898*  ALT 109* 89* 731*  ALKPHOS 65 57 168*  BILITOT QUANTITY NOT SUFFICIENT, UNABLE TO PERFORM TEST 1.4* 3.6*  PROT 7.1 6.0* 5.9*  ALBUMIN 3.0* 2.6* 2.7*   No results for input(s): LIPASE, AMYLASE in the last 168 hours. No results for input(s): AMMONIA in the last 168 hours.  ABG No results found for: PHART, PCO2ART, PO2ART, HCO3, TCO2, ACIDBASEDEF, O2SAT   Coagulation Profile: No results for input(s): INR, PROTIME in the last 168 hours.  Cardiac Enzymes: Recent Labs  Lab 08/31/18 2318 09/01/18 0310 09/02/18 0648  CKTOTAL QUANTITY NOT SUFFICIENT, UNABLE TO PERFORM TEST 1,562* 824*  CKMB  --   --  13.5*    HbA1C: Hgb A1c MFr Bld  Date/Time Value Ref Range Status  09/01/2018 03:45 AM 8.1 (H) 4.8 - 5.6 % Final    Comment:    (NOTE) Pre diabetes:          5.7%-6.4% Diabetes:              >6.4% Glycemic control for   <7.0% adults with diabetes   04/08/2015 10:51 AM 7.8 (H) 4.8 - 5.6 % Final    Comment:    (NOTE)         Pre-diabetes: 5.7 - 6.4         Diabetes: >6.4         Glycemic control for adults with diabetes: <7.0     CBG: Recent Labs  Lab 09/01/18 1908 09/01/18 2022 09/01/18 2151 09/02/18 0634 09/02/18 0905  GLUCAP 180* 184* 195* 192* 188*    Review of Systems:   Gen: Denies fever, chills, weight change, fatigue, night sweats HEENT: Denies blurred vision, + double vision, No hearing loss, tinnitus, sinus congestion, rhinorrhea, sore throat, neck stiffness, dysphagia PULM: Denies shortness of breath, cough, sputum production, hemoptysis, wheezing CV: Denies chest pain, edema, orthopnea, paroxysmal nocturnal dyspnea, palpitations GI: +  abdominal pain, No nausea, vomiting, diarrhea, hematochezia,  melena, + constipation, change in bowel habits GU: Denies dysuria, hematuria, polyuria, oliguria, urethral discharge Endocrine: Denies hot or cold intolerance, polyuria, polyphagia or appetite change Derm: Denies rash, dry skin, scaling or peeling skin change Heme: + easy bruising, bleeding, bleeding gums Neuro: Denies headache, numbness, weakness, slurred speech, loss of memory or consciousness  Past Medical History  She,  has a past medical history of Anemia, Arthritis, CAD (coronary artery disease), CAD (coronary artery disease), Cancer ([place]), CHF (congestive heart failure) (  [place]), Coronary artery disease, Diabetes mellitus, Diabetic neuropathy ([place]), GERD (gastroesophageal reflux disease), Gout, Hiatal hernia, Hyperlipidemia, Hypertension, PAD (peripheral artery disease) ([place]), and Tremor.   Surgical History    Past Surgical History:  Procedure Laterality Date   BILATERAL CORNEA IMPLANTS  2010   CARDIAC CATHETERIZATION     stent post MI    CHOLECYSTECTOMY     CORONARY ANGIOPLASTY     2004  stent   EYE SURGERY     KNEE SURGERY  1974   right   LUMBAR LAMINECTOMY/DECOMPRESSION MICRODISCECTOMY N/A 04/13/2015   Procedure: L4-5 Decompression;  Surgeon: Marybelle Killings, MD;  Location: [facility];  Service: Orthopedics;  Laterality: N/A;  Need RNFA     Social History   reports that she quit smoking about 40 years ago. Her smoking use included cigarettes. She has a 2.50 pack-year smoking history. She has never used smokeless tobacco. She reports that she does not drink alcohol or use drugs.   Family History   Her family history includes CAD in her mother; Cancer in her mother; Heart attack in her brother, father, and sister; Heart disease in her mother; Stroke in her mother.   Allergies Allergies  Allergen Reactions   Sulfur Rash    Rash/flushed, blood red eyes     Home Medications  Prior to Admission medications   Medication Sig Start Date End Date Taking? Authorizing Provider   allopurinol (ZYLOPRIM) 300 MG tablet Take 300 mg by mouth daily.   Yes [provider]  apixaban (ELIQUIS) 5 MG TABS tablet Take 1 tablet (5 mg total) by mouth 2 (two) times daily. 05/19/18  Yes Nahser, Wonda Cheng, MD  furosemide (LASIX) 40 MG tablet Take 40 mg by mouth every other day.    Yes [provider]  gabapentin (NEURONTIN) 600 MG tablet Take 600 mg by mouth 2 (two) times daily.     Yes [provider]  insulin glargine (LANTUS) 100 unit/mL SOPN Inject 8 Units into the skin 2 (two) times daily.    Yes [provider]  Magnesium Oxide 400 (240 Mg) MG TABS Take 1 tablet (400 mg total) by mouth daily. 10/31/16  Yes Nahser, Wonda Cheng, MD  metoprolol succinate (TOPROL-XL) 25 MG 24 hr tablet Take 25 mg by mouth daily.   Yes [provider]  nitroGLYCERIN (NITROSTAT) 0.4 MG SL tablet PLACE 1 TABLET UNDER THE TONGUE EVERY 5 MINUTES AS NEEDED FOR CHEST PAIN Patient taking differently: Place 0.4 mg under the tongue every 5 (five) minutes as needed for chest pain.  08/13/17  Yes Nahser, Wonda Cheng, MD  RESTASIS 0.05 % ophthalmic emulsion Place 1 drop into both eyes 2 (two) times daily. 05/19/18  Yes [provider]  rosuvastatin (CRESTOR) 10 MG tablet Take 10 mg by mouth daily.   Yes [provider]  metFORMIN (GLUCOPHAGE) 500 MG tablet Take 500 mg by mouth 2 (two) times daily with a meal.    [provider]  RaNITidine HCl (ZANTAC PO) Take 1 tablet by mouth daily.    [provider]     Critical care time: 61 minutes    Magdalen Spatz, AGACNP-BC [place] Pager # [phone] 09/02/2018  11:50 AM

## 2018-09-02 NOTE — Progress Notes (Signed)
PROGRESS NOTE    Teresa Peck  OLM:[id number] DOB: May 27, 1940 DOA: 08/31/2018 PCP: Deland Pretty, MD    Brief Narrative:   Teresa Peck  is a 78 y.o. female with past medical history remarkable for persistent atrial fibrillation, CAD with occluded LAD status post PCI/stent to first diagonal, chronic systolic congestive heart failure, essential hypertension, type 2 diabetes mellitus and osteoarthritis who was apparently found down on the floor by family secondary to mechanical fall.  She was apparently found in the bathroom and may have been there for up to 2-3 days.  Upon EMS arrival, patient was coherent and answers questions appropriately.  She had been constipated recently and taking stool softeners with multiple trips to the bathroom that evening and she apparently slipped and fell in her bathroom where she was found.  In the ED, patient was noted to have WBC count of 16.5, hemoglobin 16.8, platelets 151, sodium 135, potassium 5.3, BUN 67, creatinine 1.73, glucose 433, anion gap 17, bicarb 18, AST 121, ALT 109, alk phos 65.  Patient was given 1 L normal saline bolus.  Patient was referred for admission by EDP for DKA, mild rhabdomyolysis, and A. fib with RVR.  Assessment & Plan:   Principal Problem:   DKA (diabetic ketoacidoses) ([facility]) Active Problems:   CAD (coronary artery disease)   Chronic systolic CHF (congestive heart failure) (HCC)   Chronic atrial fibrillation   ARF (acute renal failure) (HCC)   Atrial fibrillation with RVR (HCC)   Hyperkalemia   Abnormal liver function  Acute metabolic encephalopathy Hypotension Patient with worsening hypotension this morning with SBP in the mid 60s with MAP of 57.  No infectious etiology identified; CXR negative, UA negative, no fever or leukocytosis.  Patient has had a few doses of oral narcotics, and did receive a dose of Narcan with mild improvement.  Continues with low respiratory rate and significant hypotension associated with  altered mental status.  On physical exam, pupils 3 mm equal round reactive to light. --We will repeat dose of Narcan now --Discontinue narcotics --1 L IV fluid bolus now; cautious use with interval decrease in EF from previous, now at 30-35% --Given somnolence, low respiratory rate and persistent hypotension will transfer to ICU for higher level of care --Phenylephrine; titrate for map >65 --PCCM consulted for assistance and further recommendations  Diabetic ketoacidosis High anion gap metabolic acidosis Hx E1EO Patient presenting after being found down in her bathroom for likely 2-3 days, likely not taking her home medications during this timeframe.  Patient on metformin 500 mg p.o. twice daily and Lantus 8 units [place] BID at home.  Glucose elevated to 433 on admission with an anion gap of 17 and a CO2 of 18.  Urinalysis with 20 ketones.  Chest x-ray clear, UA unrevealing; unlikely infectious etiology. --Hemoglobin A1c 8.1; fairly well controlled outpatient --Titrated off of insulin drip on 09/01/2018 --Diabetic educator following --Continue Lantus 10 units subcutaneously daily --NovoLog insulin sliding scale for coverage --Supportive care  Persistent atrial fibrillation with RVR Follows with cardiology outpatient, Dr. Acie Fredrickson.  On metoprolol succinate 25 mg p.o. daily and Eliquis 5 mg p.o. twice daily outpatient.  Etiology of her elevated heart rate likely secondary to not taking her medications for the past few days as above. --Metoprolol tartrate 12.5 mg p.o. twice daily; metoprolol 5 mg IV prn for sustained tachycardia --Continue chronic anticoagulation with Eliquis 5 mg p.o. twice daily --Continue to monitor on telemetry  Mild rhabdomyolysis Mechanical fall CK elevated at 1562.  Patient reports slipped in her bathroom and was unable to get up.  Daughter-in-law reports that she was likely down for 2-3 days.  Etiology likely prolonged downtime. --Continue gentle IV fluid  hydration --Repeat CK level improved to 824 today --PT/OT consultation; likley will need to hold with AMS/hypotension as above  Acute renal failure Creatinine noted to be elevated to 1.73 at time of admission.  Etiology likely secondary to prerenal azotemia versus ATN from demand ischemia secondary to A. fib with RVR as above.  It was 1.31 on 02/26/2018.  Renal ultrasound with symmetric renal atrophy, no hydronephrosis or other reversible finding. --Creatinine 1.73-->1.48-->1.48-->1.25-->1.69 --Avoid nephrotoxins, renally dose all medication --Monitor urinary output --Continue to monitor renal function daily  Elevated troponin Troponin 56, 66.  Etiology likely demand ischemia from underlying A. fib with RVR and a mild rhabdomyolysis. --Continue treatment of underlying A. fib with RVR as above --Monitor on telemetry  Transaminitis 2/2 Shock Liver Patient with elevated AST/ALT on admission.  Etiology likely from mild shock liver from underlying A. fib with RVR and prolonged downtime.  Now worsening, likely secondary to hypotension. --AST 121-->90-->1898 --ALT 109-->89-->731 --Check right upper quadrant ultrasound --Acute hepatitis panel --IV fluid bolus, starting vasopressors as above --Repeat CMP in a.m.  Essential hypertension Chronic systolic congestive heart failure; appears compensated CAD Patient follows with cardiology outpatient, Dr. Acie Fredrickson.  Patient with history of LAD occlusion with stenting to first diagonal.  Blood pressure well controlled 120/59; although patient noted to be in A. fib with RVR as above secondary to being without her medications over the past 2-3 days.  Last TTE October 2017 with EF 50-55%. --TTE 7/13 with EF 30-35%, mid to apical anterior septal/anterior wall akinetic, normal RV systolic function, LA mildly dilated, trivial pericardial effusion, trivial MR --Continue home metoprolol succinate 25 mg p.o. daily --Continue statin  DVT prophylaxis: Eliquis Code  Status: Full code Family Communication: attempted to call patients family for update this morning but no answer, will try again later this morning. Disposition Plan: Transfer to ICU for hypotension and altered mental status, further to be determined   Consultants:   PCCM  Procedures:  Transthoracic echocardiogram: 7/13 IMPRESSIONS    1. The left ventricle has moderate-severely reduced systolic function, with an ejection fraction of 30-35%. The cavity size was normal. Left ventricular diastolic Doppler parameters are indeterminate. Technically difficult study with poor windows to  assess wall motion. Definity may have helped but was not used. The mid to apical anteroseptal and anterior walls as well as the apical inferior wall and the true apex appeared akinetic.  2. The right ventricle has normal systolic function. The cavity was normal. There is no increase in right ventricular wall thickness.  3. Left atrial size was mildly dilated.  4. Trivial pericardial effusion is present.  5. The mitral valve is degenerative. Moderate calcification of the mitral valve leaflet. There is moderate mitral annular calcification present. No evidence of mitral valve stenosis. Trivial mitral regurgitation.  6. The aortic valve is tricuspid. Moderate calcification of the aortic valve. No stenosis of the aortic valve.  7. The ascending aorta and aortic root are normal in size and structure.  8. Normal IVC size. PA systolic pressure 27 mmHg.  Antimicrobials:   None   Subjective:  Patient seen and examined at bedside this morning, somnolent.  Noted with significant hypotension.  Nursing concerned regarding her progressive decline overnight.  Has been taking some narcotics.  Did receive a dose of Narcan overnight with no appreciable  change.  Transaminitis worsening likely secondary to hypotension.  Patient with soft respiratory rate; and will open eyes briefly to command.  No infectious etiology identified  as patient is afebrile without leukocytosis.  Suspect etiology narcotic side effect versus worsening heart failure.  Will start patient on vasopressors, phenylephrine and transfer to ICU.  Discussed with PCCM who will see in consultation.  Objective: Vitals:   09/02/18 0026 09/02/18 0412 09/02/18 0636 09/02/18 0812  BP: (!) 94/55 (!) 94/54 (!) 98/54 (!) 85/41  Pulse: 73 95  (!) 112  Resp: 10 12    Temp: (!) 97.3 F (36.3 C) 98.3 F (36.8 C)  98.9 F (37.2 C)  TempSrc: Oral Oral  Oral  SpO2: 97% 97%  92%  Weight:        Intake/Output Summary (Last 24 hours) at 09/02/2018 1014 Last data filed at 09/02/2018 0630 Gross per 24 hour  Intake 2477.6 ml  Output 1400 ml  Net 1077.6 ml   Filed Weights   08/31/18 2229 09/01/18 0446  Weight: 78.9 kg 74.2 kg    Examination:  General exam: Sleeping, somnolent HEENT: Ecchymosis noted to right face and right neck, pupils 3 mm bilaterally, equal round reactive to light Respiratory system: Clear to auscultation. Respiratory effort normal with slightly decreased drive, on room air Cardiovascular system: Irregularly irregular rhythm, tachycardic in the low 100s. No JVD, murmurs, rubs, gallops or clicks. No pedal edema. Gastrointestinal system: Abdomen is nondistended, soft and nontender. No organomegaly or masses felt. Normal bowel sounds heard. Central nervous system: Somnolent, opens eyes briefly to command Extremities: Moving all extremities independently Skin: Ecchymosis noted to neck/face Psychiatry: Somnolent    Data Reviewed: I have personally reviewed following labs and imaging studies  CBC: Recent Labs  Lab 08/31/18 2318 09/01/18 0345 09/02/18 0648  WBC 16.5* 15.5* 7.0  NEUTROABS 14.8*  --   --   HGB 16.8* 15.2* 14.7  HCT 49.0* 45.4 44.1  MCV 93.7 96.2 96.7  PLT 151 181 158   Basic Metabolic Panel: Recent Labs  Lab 09/01/18 0621 09/01/18 1035 09/01/18 1514 09/01/18 1940 09/02/18 0648  NA 138 141 141 139 137  K 4.7  3.7 3.7 4.0 3.9  CL 103 109 109 108 103  CO2 19* 19* 19* 22 21*  GLUCOSE 379* 187* 145* 218* 226*  BUN 67* 64* 65* 67* 70*  CREATININE 1.48* 1.33* 1.19* 1.25* 1.69*  CALCIUM 9.1 9.0 8.6* 8.6* 8.9   GFR: CrCl cannot be calculated (Unknown ideal weight.). Liver Function Tests: Recent Labs  Lab 08/31/18 2318 09/01/18 0345 09/02/18 0648  AST 121* 90* 1,898*  ALT 109* 89* 731*  ALKPHOS 65 57 168*  BILITOT QUANTITY NOT SUFFICIENT, UNABLE TO PERFORM TEST 1.4* 3.6*  PROT 7.1 6.0* 5.9*  ALBUMIN 3.0* 2.6* 2.7*   No results for input(s): LIPASE, AMYLASE in the last 168 hours. No results for input(s): AMMONIA in the last 168 hours. Coagulation Profile: No results for input(s): INR, PROTIME in the last 168 hours. Cardiac Enzymes: Recent Labs  Lab 08/31/18 2318 09/01/18 0310 09/02/18 0648  CKTOTAL QUANTITY NOT SUFFICIENT, UNABLE TO PERFORM TEST 1,562* 824*  CKMB  --   --  13.5*   BNP (last 3 results) No results for input(s): PROBNP in the last 8760 hours. HbA1C: Recent Labs    09/01/18 0345  HGBA1C 8.1*   CBG: Recent Labs  Lab 09/01/18 1908 09/01/18 2022 09/01/18 2151 09/02/18 0634 09/02/18 0905  GLUCAP 180* 184* 195* 192* 188*   Lipid Profile:  No results for input(s): CHOL, HDL, LDLCALC, TRIG, CHOLHDL, LDLDIRECT in the last 72 hours. Thyroid Function Tests: Recent Labs    09/01/18 0345  TSH 0.693   Anemia Panel: No results for input(s): VITAMINB12, FOLATE, FERRITIN, TIBC, IRON, RETICCTPCT in the last 72 hours. Sepsis Labs: No results for input(s): PROCALCITON, LATICACIDVEN in the last 168 hours.  Recent Results (from the past 240 hour(s))  SARS Coronavirus 2 (CEPHEID - Performed in [place] hospital lab), Hosp Order     Status: None   Collection Time: 09/01/18  2:00 AM   Specimen: Nasopharyngeal Swab  Result Value Ref Range Status   SARS Coronavirus 2 NEGATIVE NEGATIVE Final    Comment: (NOTE) If result is NEGATIVE SARS-CoV-2 target nucleic acids are  NOT DETECTED. The SARS-CoV-2 RNA is generally detectable in upper and lower  respiratory specimens during the acute phase of infection. The lowest  concentration of SARS-CoV-2 viral copies this assay can detect is 250  copies / mL. A negative result does not preclude SARS-CoV-2 infection  and should not be used as the sole basis for treatment or other  patient management decisions.  A negative result may occur with  improper specimen collection / handling, submission of specimen other  than nasopharyngeal swab, presence of viral mutation(s) within the  areas targeted by this assay, and inadequate number of viral copies  (<250 copies / mL). A negative result must be combined with clinical  observations, patient history, and epidemiological information. If result is POSITIVE SARS-CoV-2 target nucleic acids are DETECTED. The SARS-CoV-2 RNA is generally detectable in upper and lower  respiratory specimens dur ing the acute phase of infection.  Positive  results are indicative of active infection with SARS-CoV-2.  Clinical  correlation with patient history and other diagnostic information is  necessary to determine patient infection status.  Positive results do  not rule out bacterial infection or co-infection with other viruses. If result is PRESUMPTIVE POSTIVE SARS-CoV-2 nucleic acids MAY BE PRESENT.   A presumptive positive result was obtained on the submitted specimen  and confirmed on repeat testing.  While 2019 novel coronavirus  (SARS-CoV-2) nucleic acids may be present in the submitted sample  additional confirmatory testing may be necessary for epidemiological  and / or clinical management purposes  to differentiate between  SARS-CoV-2 and other Sarbecovirus currently known to infect humans.  If clinically indicated additional testing with an alternate test  methodology [id number]) is advised. The SARS-CoV-2 RNA is generally  detectable in upper and lower respiratory sp ecimens  during the acute  phase of infection. The expected result is Negative. Fact Sheet for Patients:  StrictlyIdeas.no Fact Sheet for Healthcare Providers: BankingDealers.co.za This test is not yet approved or cleared by the [place] FDA and has been authorized for detection and/or diagnosis of SARS-CoV-2 by FDA under an Emergency Use Authorization (EUA).  This EUA will remain in effect (meaning this test can be used) for the duration of the COVID-19 declaration under Section 564(b)(1) of the Act, 21 U.S.C. section 360bbb-3(b)(1), unless the authorization is terminated or revoked sooner. Performed at [facility], [place] [address], [place], [place] [address]          Radiology Studies: Dg Wrist Complete Right  Result Date: 09/01/2018 CLINICAL DATA:  78 y/o  F; right wrist pain after fall. EXAM: RIGHT WRIST - COMPLETE 3+ VIEW COMPARISON:  None. FINDINGS: There is no evidence of fracture or dislocation. There is no evidence of arthropathy or other focal bone  abnormality. Soft tissues are unremarkable. IMPRESSION: No acute fracture or dislocation identified. Electronically Signed   By: Kristine Garbe M.D.   On: 09/01/2018 00:57   Ct Head Wo Contrast  Result Date: 09/01/2018 CLINICAL DATA:  Today. Large forehead swelling. EXAM: CT HEAD WITHOUT CONTRAST CT CERVICAL SPINE WITHOUT CONTRAST TECHNIQUE: Multidetector CT imaging of the head and cervical spine was performed following the standard protocol without intravenous contrast. Multiplanar CT image reconstructions of the cervical spine were also generated. COMPARISON:  Head CT without contrast 06/18/2011 FINDINGS: CT HEAD FINDINGS Brain: Cerebral volume is within normal limits for age. There is chronic subcortical white matter hypodensity at the anterior left operculum which is stable (series 4, image 22). Mild chronic overlying cortical encephalomalacia. Otherwise normal Gray-white  matter differentiation throughout the brain. No midline shift, ventriculomegaly, mass effect, evidence of mass lesion, intracranial hemorrhage or evidence of cortically based acute infarction. Vascular: Mild for age Calcified atherosclerosis at the skull base. No suspicious intracranial vascular hyperdensity. Skull: Stable and intact. Sinuses/Orbits: Mild frontoethmoidal fluid and bubbly opacity. Mild maxillary mucosal thickening. Other paranasal sinuses are well pneumatized. There is trace bilateral mastoid fluid. Other: Broad-based right side scalp hematoma up to 8 millimeters in thickness. Hematoma tracks anteriorly to the right forehead. New left forehead soft tissue scarring since 2013. Orbits soft tissues appears stable and negative. Mild right premalar soft tissue stranding. CT CERVICAL SPINE FINDINGS Alignment: Straightening of cervical lordosis. Cervicothoracic junction alignment is within normal limits. Bilateral posterior element alignment is within normal limits. Skull base and vertebrae: Visualized skull base is intact. No atlanto-occipital dissociation. Ankylosis of the left C2-C3 posterior elements. Interbody ankylosis at C5-C6, appears acquired rather than congenital. Developing ankylosis at C7-T1 including the left posterior element. No acute osseous abnormality identified. Soft tissues and spinal canal: No prevertebral fluid or swelling. No visible canal hematoma. Asymmetric soft tissue stranding in the right neck and supraclavicular fossa (series 9, image 57). No discrete hematoma. Partially retropharyngeal course of the carotids, normal variant. Disc levels: Widespread cervical spine degeneration. Mild spinal stenosis at least at C3-C4 and C6-C7. Upper chest: Visible upper thoracic levels appear intact. The right neck soft tissue swelling continues to the anterior right chest wall. IMPRESSION: 1. Right scalp hematoma without skull fracture. 2. Stable since 2013 non contrast CT appearance of the  brain. Minimal encephalomalacia at the anterior left operculum. 3. Probably posttraumatic soft tissue stranding/contusion in the right neck, but no discrete neck hematoma. 4. No acute traumatic injury identified in the cervical spine. 5. 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 Electronically Signed   By: Genevie Ann M.D.   On: 09/01/2018 00:03   Ct Cervical Spine Wo Contrast  Result Date: 09/01/2018 CLINICAL DATA:  Today. Large forehead swelling. EXAM: CT HEAD WITHOUT CONTRAST CT CERVICAL SPINE WITHOUT CONTRAST TECHNIQUE: Multidetector CT imaging of the head and cervical spine was performed following the standard protocol without intravenous contrast. Multiplanar CT image reconstructions of the cervical spine were also generated. COMPARISON:  Head CT without contrast 06/18/2011 FINDINGS: CT HEAD FINDINGS Brain: Cerebral volume is within normal limits for age. There is chronic subcortical white matter hypodensity at the anterior left operculum which is stable (series 4, image 22). Mild chronic overlying cortical encephalomalacia. Otherwise normal Gray-white matter differentiation throughout the brain. No midline shift, ventriculomegaly, mass effect, evidence of mass lesion, intracranial hemorrhage or evidence of cortically based acute infarction. Vascular: Mild for age Calcified atherosclerosis at the skull base. No suspicious intracranial vascular hyperdensity. Skull: Stable and  intact. Sinuses/Orbits: Mild frontoethmoidal fluid and bubbly opacity. Mild maxillary mucosal thickening. Other paranasal sinuses are well pneumatized. There is trace bilateral mastoid fluid. Other: Broad-based right side scalp hematoma up to 8 millimeters in thickness. Hematoma tracks anteriorly to the right forehead. New left forehead soft tissue scarring since 2013. Orbits soft tissues appears stable and negative. Mild right premalar soft tissue stranding. CT CERVICAL SPINE FINDINGS Alignment: Straightening of  cervical lordosis. Cervicothoracic junction alignment is within normal limits. Bilateral posterior element alignment is within normal limits. Skull base and vertebrae: Visualized skull base is intact. No atlanto-occipital dissociation. Ankylosis of the left C2-C3 posterior elements. Interbody ankylosis at C5-C6, appears acquired rather than congenital. Developing ankylosis at C7-T1 including the left posterior element. No acute osseous abnormality identified. Soft tissues and spinal canal: No prevertebral fluid or swelling. No visible canal hematoma. Asymmetric soft tissue stranding in the right neck and supraclavicular fossa (series 9, image 57). No discrete hematoma. Partially retropharyngeal course of the carotids, normal variant. Disc levels: Widespread cervical spine degeneration. Mild spinal stenosis at least at C3-C4 and C6-C7. Upper chest: Visible upper thoracic levels appear intact. The right neck soft tissue swelling continues to the anterior right chest wall. IMPRESSION: 1. Right scalp hematoma without skull fracture. 2. Stable since 2013 non contrast CT appearance of the brain. Minimal encephalomalacia at the anterior left operculum. 3. Probably posttraumatic soft tissue stranding/contusion in the right neck, but no discrete neck hematoma. 4. No acute traumatic injury identified in the cervical spine. 5. 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 Electronically Signed   By: Genevie Ann M.D.   On: 09/01/2018 00:03   US Renal  Result Date: 09/01/2018 CLINICAL DATA:  Acute renal failure EXAM: RENAL / URINARY TRACT ULTRASOUND COMPLETE COMPARISON:  None. FINDINGS: Right Kidney: Renal measurements: 9.3 x 3.8 x 4.2 cm = volume: 80 mL. Cortical thinning and mild lobulation. Negative for mass or hydronephrosis. Left Kidney: Renal measurements: 8.5 x 4.8 x 4.1 cm = volume: 86 mL. Cortical thinning and mild lobulation. Negative for mass or hydronephrosis. Bladder: Appears normal for degree of  bladder distention. IMPRESSION: 1. Symmetric renal atrophy. 2. No hydronephrosis or other reversible finding. Electronically Signed   By: Monte Fantasia M.D.   On: 09/01/2018 08:27   Dg Pelvis Portable  Result Date: 08/31/2018 CLINICAL DATA:  Fall 2 days ago with pelvic pain, initial encounter EXAM: PORTABLE PELVIS 1-2 VIEWS COMPARISON:  None. FINDINGS: Degenerative changes of the lumbar spine are seen. Degenerative changes of the hip joints are noted bilaterally. No soft tissue abnormality is seen. No fractures are seen. IMPRESSION: No acute abnormality noted. Electronically Signed   By: Inez Catalina M.D.   On: 08/31/2018 23:57   Dg Chest Port 1 View  Result Date: 08/31/2018 CLINICAL DATA:  Recent fall with chest pain, initial encounter EXAM: PORTABLE CHEST 1 VIEW COMPARISON:  None. FINDINGS: Cardiac shadow is at the upper limits of normal in size. Aortic calcifications are seen. The lungs are well aerated without focal infiltrate or sizable effusion. No bony abnormality is seen. IMPRESSION: No acute abnormality noted. Electronically Signed   By: Inez Catalina M.D.   On: 08/31/2018 23:58   Dg Knee Right Port  Result Date: 08/31/2018 CLINICAL DATA:  Recent fall with right knee pain, initial encounter EXAM: PORTABLE RIGHT KNEE - 2 VIEW COMPARISON:  None. FINDINGS: Degenerative changes of the right knee joint are seen. No sizable effusion is noted. No acute fracture or dislocation is seen. IMPRESSION: Degenerative change  without acute abnormality. Electronically Signed   By: Inez Catalina M.D.   On: 08/31/2018 23:57   US Abdomen Limited Ruq  Result Date: 09/01/2018 CLINICAL DATA:  Abnormal liver function tests EXAM: ULTRASOUND ABDOMEN LIMITED RIGHT UPPER QUADRANT COMPARISON:  None. FINDINGS: Gallbladder: History of cholecystectomy. Shadowing gas is seen in the gallbladder fossa. Common bile duct: Diameter: 5 mm.  Where visualized, no filling defect. Liver: Borderline starry sky appearance which is  likely technical in this case. No evidence of mass lesion. Portal vein is patent on color Doppler imaging with normal direction of blood flow towards the liver. IMPRESSION: Negative right upper quadrant ultrasound after cholecystectomy. Electronically Signed   By: Monte Fantasia M.D.   On: 09/01/2018 04:15        Scheduled Meds:  allopurinol  100 mg Oral Daily   apixaban  5 mg Oral BID   furosemide  20 mg Oral Daily   gabapentin  300 mg Oral BID   insulin aspart  0-5 Units Subcutaneous QHS   insulin aspart  0-9 Units Subcutaneous TID WC   insulin glargine  10 Units Subcutaneous Daily   magnesium oxide  400 mg Oral Daily   metoprolol tartrate  12.5 mg Oral BID   Continuous Infusions:  phenylephrine (NEO-SYNEPHRINE) Adult infusion       LOS: 1 day    Critical Care Time Upon my evaluation, this patient had a high probability of imminent or life-threatening deterioration due to hypotension, altered mental status, shock liver, acute renal failure which required my direct attention, intervention, and personal management.  I have personally provided 39 minutes of critical care time exclusive of my time spent on separately billable procedures.  Time includes review of laboratory data, radiology results, discussion with consultants, and monitoring for potential decompensation.       Amaurie Wandel J [place] DO [facility] Pager [phone]  If 7PM-7AM, please contact night-coverage [email or web address] Password [email or web address] 09/02/2018, 10:14 AM

## 2018-09-02 NOTE — Significant Event (Addendum)
Rapid Response Event Note  Overview: Hypotension and Decreased LOC  Initial Focused Assessment: Called by nurse about's SBP being the 60s along with patient being hard to arouse. As I walking to 4E, I asked the nurse to obtain a blood sugar and temperature, she informed that the MD had ordered a 1L NS bolus (to be given over 250cc/hr). Patient was very hard to arouse when I arrived, somnolent, pupils 3 mm, sluggish, patient did respond to painful stimuli, would occassionally moan and open her eyes to painful stimuli but was not following commands. RR was only 8 - 10 at best with stimulation, per nurse oxygen saturations were 88%, patient was placed on 4L [place] prior to my arrival, current saturations were 96% Lung sounds clear in upper fields, diminished in th lower fields. Skin clammy, diaphoretic, + pulses, HR 90-100s AF, SBP 60-70s, MAPs 50s. PIV - 24 G RH and PIV 22 G LW. Increased LFTs and CR/BUN per labs, along with elevated troponin.   Nurse reviewed the patient's chart. Per review, patient received Norco 5/325 (2 Tabs) last night at 2132, Digoxin 0.5 mg IV at 0712, and was on Cardizem infusion yesterday til 1630.   Interventions: -- NS bolus increased to 500cc/hr - SBP improved slowly into the low 100s -- 20 G PIV RFA started. -- Narcan 0.4 mg IV - after administration, patient became awake, oriented to self, place, and situation. Able to answer questions about her life (lives [place], goes to church, makes meals, etc). RR improved as well. After Narcan - patient endorsed generalized abdominal pain. Able to move all extremities to command, generalized weakness all over but equal in strength.   Plan of Care: -- [facility] MD consulted PCCM. PCCM team came to the bedside. Peripherall Neo-Syn was ordered to keep MAP > 65. Was not started on 4E, SBP > 100 and MAP > 65. Patient was transferred to 3M07.   Event Summary:  Call Time 0930 Arrival Time 0932 End Time 1115  Aquil Duhe R

## 2018-09-02 NOTE — Progress Notes (Signed)
Patient will be moving to 67M ICU. Called [place] and gave report on patient.

## 2018-09-02 NOTE — Progress Notes (Signed)
Patient will not respond to my voice, she will withdraw to pain she is not Alert or Oriented. B/P 67/50, 12, O2 95%, HR 93 Called Provider and notified of patient condition. Provider ordered 1 L Bolus 250/hr Called Rapid to come and see patient.

## 2018-09-02 NOTE — Progress Notes (Signed)
[facility] Progress Note Patient Name: Teresa Peck DOB: 1940/06/04 MRN: [id number]   Date of Service  09/02/2018  HPI/Events of Note  Pt needs POC blood glucose testing changed to Q 4 hours with phase 1 sq Insulin coverage  eICU Interventions  Orders entered        Frederik Pear 09/02/2018, 10:50 PM

## 2018-09-02 NOTE — Progress Notes (Signed)
Foley catheter placed per order d/t pt bladder scan showing >720 ml urine in bladder. Abdomen distended. Urine returned: yellow and cloudy. Pt tolerated procedure fair. Was in pain d/t injuries from fall at home. HR rose into the 130s. Will continue to monitor. Lajoyce Corners, RN

## 2018-09-02 NOTE — Progress Notes (Signed)
Spoke with patient's son and gave him report on patient. Told him we would be sending patient to ICU. Gave him updates on patient condition and answered all questions and concerns regarding patient. He will forward information to other family members.

## 2018-09-02 NOTE — Progress Notes (Addendum)
OT Cancellation Note  Patient Details Name: Teresa Peck MRN: [id number] DOB: Jul 11, 1940   Cancelled Treatment:    Reason Eval/Treat Not Completed: Medical issues which prohibited therapy; pt transferred to ICU this AM, noted due to BP issues, decreased responsiveness. Will follow up for OT eval as able.   Lou Cal, OT Supplemental Rehabilitation Services Pager [phone] Office [place] 09/02/2018, 11:03 AM

## 2018-09-02 NOTE — Progress Notes (Signed)
PT Cancellation Note  Patient Details Name: Teresa Peck MRN: [id number] DOB: 03-Apr-1940   Cancelled Treatment:    Reason Eval/Treat Not Completed: (P) Medical issues which prohibited therapy Pt with decreased BP and moving to ICU. PT will follow back tomorrow.   Joshus Rogan B. Migdalia Dk PT, DPT Acute Rehabilitation Services Pager [phone] Office [phone]    Mila Doce 09/02/2018, 10:44 AM

## 2018-09-03 ENCOUNTER — Inpatient Hospital Stay (HOSPITAL_COMMUNITY): Payer: Medicare HMO

## 2018-09-03 LAB — CBC
HCT: 39.8 % (ref 36.0–46.0)
Hemoglobin: 12.9 g/dL (ref 12.0–15.0)
MCH: 32.3 pg (ref 26.0–34.0)
MCHC: 32.4 g/dL (ref 30.0–36.0)
MCV: 99.5 fL (ref 80.0–100.0)
Platelets: 135 10*3/uL — ABNORMAL LOW (ref 150–400)
RBC: 4 MIL/uL (ref 3.87–5.11)
RDW: 14.6 % (ref 11.5–15.5)
WBC: 10.8 10*3/uL — ABNORMAL HIGH (ref 4.0–10.5)
nRBC: 0 % (ref 0.0–0.2)

## 2018-09-03 LAB — PROTIME-INR
INR: 2.6 — ABNORMAL HIGH (ref 0.8–1.2)
Prothrombin Time: 27.5 seconds — ABNORMAL HIGH (ref 11.4–15.2)

## 2018-09-03 LAB — HEPATITIS PANEL, ACUTE
HCV Ab: <0.1 s/co ratio (ref 0.0–0.9)
Hep A IgM: NEGATIVE
Hep B C IgM: NEGATIVE
Hepatitis B Surface Ag: NEGATIVE

## 2018-09-03 LAB — COMPREHENSIVE METABOLIC PANEL
ALT: 381 U/L — ABNORMAL HIGH (ref 0–44)
AST: 474 U/L — ABNORMAL HIGH (ref 15–41)
Albumin: 2.1 g/dL — ABNORMAL LOW (ref 3.5–5.0)
Alkaline Phosphatase: 145 U/L — ABNORMAL HIGH (ref 38–126)
Anion gap: 12 (ref 5–15)
BUN: 62 mg/dL — ABNORMAL HIGH (ref 8–23)
CO2: 19 mmol/L — ABNORMAL LOW (ref 22–32)
Calcium: 8.5 mg/dL — ABNORMAL LOW (ref 8.9–10.3)
Chloride: 108 mmol/L (ref 98–111)
Creatinine, Ser: 1.51 mg/dL — ABNORMAL HIGH (ref 0.44–1.00)
GFR calc Af Amer: 37 mL/min — ABNORMAL LOW (ref >60)
GFR calc non Af Amer: 32 mL/min — ABNORMAL LOW (ref >60)
Glucose, Bld: 120 mg/dL — ABNORMAL HIGH (ref 70–99)
Potassium: 4.3 mmol/L (ref 3.5–5.1)
Sodium: 139 mmol/L (ref 135–145)
Total Bilirubin: 3.7 mg/dL — ABNORMAL HIGH (ref 0.3–1.2)
Total Protein: 5.4 g/dL — ABNORMAL LOW (ref 6.5–8.1)

## 2018-09-03 LAB — HEMOGLOBIN A1C
Hgb A1c MFr Bld: 8.2 % — ABNORMAL HIGH (ref 4.8–5.6)
Mean Plasma Glucose: 188.64 mg/dL

## 2018-09-03 LAB — GLUCOSE, CAPILLARY
Glucose-Capillary: 98 mg/dL (ref 70–99)
Glucose-Capillary: 98 mg/dL (ref 70–99)
Glucose-Capillary: 95 mg/dL (ref 70–99)
Glucose-Capillary: 78 mg/dL (ref 70–99)
Glucose-Capillary: 158 mg/dL — ABNORMAL HIGH (ref 70–99)

## 2018-09-03 LAB — TROPONIN I (HIGH SENSITIVITY): Troponin I (High Sensitivity): 60 ng/L — ABNORMAL HIGH (ref <18)

## 2018-09-03 LAB — PHOSPHORUS: Phosphorus: 2.9 mg/dL (ref 2.5–4.6)

## 2018-09-03 LAB — MAGNESIUM: Magnesium: 2.1 mg/dL (ref 1.7–2.4)

## 2018-09-03 LAB — CK: Total CK: 350 U/L — ABNORMAL HIGH (ref 38–234)

## 2018-09-03 LAB — APTT: aPTT: 149 seconds — ABNORMAL HIGH (ref 24–36)

## 2018-09-03 MED ORDER — INSULIN ASPART 100 UNIT/ML ~~LOC~~ SOLN
0.0000 [IU] | Freq: Three times a day (TID) | SUBCUTANEOUS | Status: DC
  Administered 2018-09-04: 1 [IU] via SUBCUTANEOUS
  Administered 2018-09-04: 2 [IU] via SUBCUTANEOUS
  Administered 2018-09-04: 3 [IU] via SUBCUTANEOUS
  Administered 2018-09-05: 1 [IU] via SUBCUTANEOUS
  Administered 2018-09-05: 3 [IU] via SUBCUTANEOUS

## 2018-09-03 MED ORDER — METOPROLOL TARTRATE 12.5 MG HALF TABLET
12.5000 mg | ORAL_TABLET | Freq: Two times a day (BID) | ORAL | Status: DC
  Administered 2018-09-03 – 2018-09-04 (×3): 12.5 mg via ORAL
  Filled 2018-09-03 (×4): qty 1

## 2018-09-03 MED ORDER — APIXABAN 5 MG PO TABS
5.0000 mg | ORAL_TABLET | Freq: Two times a day (BID) | ORAL | Status: DC
  Administered 2018-09-03 – 2018-09-05 (×5): 5 mg via ORAL
  Filled 2018-09-03 (×5): qty 1

## 2018-09-03 MED ORDER — OXYCODONE HCL 5 MG PO TABS
5.0000 mg | ORAL_TABLET | Freq: Three times a day (TID) | ORAL | Status: DC | PRN
  Administered 2018-09-03: 5 mg via ORAL
  Filled 2018-09-03 (×2): qty 1

## 2018-09-03 MED ORDER — OXYCODONE HCL 5 MG PO TABS
5.0000 mg | ORAL_TABLET | Freq: Four times a day (QID) | ORAL | Status: DC | PRN
  Administered 2018-09-03: 5 mg via ORAL
  Filled 2018-09-03: qty 1

## 2018-09-03 MED ORDER — INSULIN ASPART 100 UNIT/ML ~~LOC~~ SOLN: 0.0000 [IU] | Freq: Every day | SUBCUTANEOUS | Status: DC

## 2018-09-03 NOTE — Evaluation (Signed)
Occupational Therapy Evaluation Patient Details Name: Teresa Peck MRN: [id number] DOB: 03-Jun-1940 Today's Date: 09/03/2018    History of Present Illness 78 year old with PMH including hypertension, hyperlipidemia, Dm2 w neuropathy, CAD , CHF , PAD, Anemia found 7/12 after being down on the floor for 2 to 3 days after mechanical fall.  Noted to have DKA, rhabdomyolysis, A. fib with RVR, elevated LFTs.  Overall she had been improving.  Rapid response called, transferred to ICU on 7/14 for altered mental status, hypotension.    Clinical Impression   PTA Pt was independent in ADL and mobility per patient, She was driving. Today Pt is presenting with hallucinations and decreased cognition, decreased balance, pain. Pt is max A for LB ADL, at least mod A for UB ADL. Pt mod to max A +2 for bed mobility, unable to achieve full upright during attempted transfer. VSS throughout session. Pt will require skilled OT acutely and post-acute at SNF level.     Follow Up Recommendations  SNF;Supervision/Assistance - 24 hour    Equipment Recommendations  Other (comment)(defer to next venue)    Recommendations for Other Services Speech consult;PT consult     Precautions / Restrictions Precautions Precautions: Fall(Delirium) Restrictions Weight Bearing Restrictions: No      Mobility Bed Mobility Overal bed mobility: Needs Assistance Bed Mobility: Supine to Sit;Sit to Supine     Supine to sit: Mod assist;+2 for physical assistance;+2 for safety/equipment;HOB elevated Sit to supine: Max assist;+2 for physical assistance;+2 for safety/equipment   General bed mobility comments: mod A +2 for all aspects of bed mobility. multimodal cues throughout  Transfers Overall transfer level: Needs assistance Equipment used: 2 person hand held assist Transfers: Sit to/from Stand Sit to Stand: Max assist;+2 physical assistance;+2 safety/equipment;From elevated surface         General transfer comment:  max A +2 for multimodal cues and Pt unable to achieve full upright or bring buttocks clear of bed    Balance Overall balance assessment: Needs assistance Sitting-balance support: Bilateral upper extremity supported;Feet unsupported Sitting balance-Leahy Scale: Poor Sitting balance - Comments: moments of min guard - mostly min A Postural control: Posterior lean Standing balance support: Bilateral upper extremity supported Standing balance-Leahy Scale: Zero Standing balance comment: dependent on external support from therapists and gait belt essential                           ADL either performed or assessed with clinical judgement   ADL Overall ADL's : Needs assistance/impaired Eating/Feeding: Minimal assistance   Grooming: Moderate assistance;Sitting   Upper Body Bathing: Moderate assistance   Lower Body Bathing: Maximal assistance   Upper Body Dressing : Moderate assistance   Lower Body Dressing: Maximal assistance   Toilet Transfer: Maximal assistance;+2 for physical assistance;+2 for safety/equipment;BSC   Toileting- Clothing Manipulation and Hygiene: Total assistance;Bed level       Functional mobility during ADLs: Maximal assistance;+2 for physical assistance;+2 for safety/equipment(unable to achieve full upright) General ADL Comments: seeing "bugs" in the room     Vision   Additional Comments: hallucinating     Perception     Praxis      Pertinent Vitals/Pain Pain Assessment: 0-10 Pain Score: 10-Worst pain ever Pain Location: Neck, Bil Knees Pain Descriptors / Indicators: Constant;Moaning;Discomfort;Grimacing Pain Intervention(s): Limited activity within patient's tolerance;Premedicated before session;Repositioned     Hand Dominance Right   Extremity/Trunk Assessment Upper Extremity Assessment Upper Extremity Assessment: Generalized weakness   Lower Extremity Assessment  Lower Extremity Assessment: Defer to PT evaluation   Cervical /  Trunk Assessment Cervical / Trunk Assessment: Kyphotic   Communication Communication Communication: HOH   Cognition Arousal/Alertness: Awake/alert Behavior During Therapy: Restless Overall Cognitive Status: Impaired/Different from baseline Area of Impairment: Orientation;Attention;Memory;Following commands;Safety/judgement;Awareness;Problem solving                 Orientation Level: Disoriented to;Time;Situation Current Attention Level: Focused Memory: Decreased short-term memory Following Commands: Follows one step commands inconsistently Safety/Judgement: Decreased awareness of safety;Decreased awareness of deficits Awareness: Intellectual Problem Solving: Slow processing;Decreased initiation;Difficulty sequencing;Requires verbal cues;Requires tactile cues General Comments: Pt seeing bugs, unable to recall information told multiple times   General Comments  VSS throughout session.     Exercises     Shoulder Instructions      Home Living Family/patient expects to be discharged to:: Private residence Living Arrangements: Alone Available Help at Discharge: Family;Available PRN/intermittently(son and daughter in law live next door) Type of Home: House Home Access: Stairs to enter [employer] of Steps: 6 Entrance Stairs-Rails: Left;Right;Can reach both Home Layout: One level     Bathroom Shower/Tub: Occupational psychologist: Standard     Home Equipment: Environmental consultant - 2 wheels;Cane - single point          Prior Functioning/Environment Level of Independence: Independent        Comments: Pt (reliability?) reports that she was doing all IADL and ADL and driving PTA        OT Problem List: Decreased strength;Decreased range of motion;Decreased activity tolerance;Impaired balance (sitting and/or standing);Decreased cognition;Decreased safety awareness;Decreased knowledge of use of DME or AE;Obesity;Pain      OT Treatment/Interventions:  Self-care/ADL training;DME and/or AE instruction;Therapeutic activities;Cognitive remediation/compensation;Patient/family education;Balance training    OT Goals(Current goals can be found in the care plan section) Acute Rehab OT Goals Patient Stated Goal: "make my neck feel better" OT Goal Formulation: With patient Time For Goal Achievement: 09/17/18 Potential to Achieve Goals: Good  OT Frequency: Min 2X/week   Barriers to D/C: Decreased caregiver support;Inaccessible home environment  Pt has 6 steps to get up and does not have 24 hour care       Co-evaluation PT/OT/SLP Co-Evaluation/Treatment: Yes Reason for Co-Treatment: Necessary to address cognition/behavior during functional activity;For patient/therapist safety;To address functional/ADL transfers PT goals addressed during session: Mobility/safety with mobility;Balance OT goals addressed during session: ADL's and self-care      AM-PAC OT "6 Clicks" Daily Activity     Outcome Measure Help from another person eating meals?: A Lot Help from another person taking care of personal grooming?: A Lot Help from another person toileting, which includes using toliet, bedpan, or urinal?: A Lot Help from another person bathing (including washing, rinsing, drying)?: A Lot Help from another person to put on and taking off regular upper body clothing?: A Lot Help from another person to put on and taking off regular lower body clothing?: A Lot 6 Click Score: 12   End of Session Equipment Utilized During Treatment: Gait belt Nurse Communication: Mobility status;Need for lift equipment  Activity Tolerance: Patient tolerated treatment well(confused throughout) Patient left: in bed;with call bell/phone within reach;with bed alarm set;with SCD's reapplied  OT Visit Diagnosis: Unsteadiness on feet (R26.81);Other abnormalities of gait and mobility (R26.89);Repeated falls (R29.6);Muscle weakness (generalized) (M62.81);Other symptoms and signs  involving cognitive function;History of falling (Z91.81);Pain Pain - part of body: Knee(neck)                Time: 1040-1110 OT Time Calculation (  min): 30 min Charges:  OT General Charges $OT Visit: 1 Visit OT Evaluation $OT Eval Moderate Complexity: [place] OTR/L Acute Rehabilitation Services Pager: [phone] Office: [place] 09/03/2018, 12:58 PM

## 2018-09-03 NOTE — Progress Notes (Signed)
ANTICOAGULATION CONSULT NOTE - Follow Up Consult  Pharmacy Consult for heparin Indication: atrial fibrillation  Labs: Recent Labs    08/31/18 2318 09/01/18 0310  09/01/18 0621  09/01/18 1940 09/02/18 1610 09/02/18 1431 09/02/18 1720 09/02/18 2126 09/03/18 0516 09/03/18 0518  HGB 16.8*  --    < >  --   --   --  14.7 13.1  --   --  12.9  --   HCT 49.0*  --    < >  --   --   --  44.1 39.5  --   --  39.8  --   PLT 151  --    < >  --   --   --  167 159  --   --  135*  --   APTT  --   --   --   --   --   --   --   --   --  38*  --  149*  LABPROT  --   --   --   --   --   --   --  26.5*  --   --  27.5*  --   INR  --   --   --   --   --   --   --  2.5*  --   --  2.6*  --   HEPARINUNFRC  --   --   --   --   --   --   --   --   --  >2.20*  --   --   CREATININE 1.73*  --    < > 1.48*   < > 1.25* 1.69* 1.82*  --   --   --   --   CKTOTAL QUANTITY NOT SUFFICIENT, UNABLE TO PERFORM TEST 1,562*  --   --   --   --  824*  --   --   --   --   --   CKMB  --   --   --   --   --   --  13.5*  --   --   --   --   --   TROPONINIHS  --   --    < > 66*  --   --   --  74* 67*  --   --   --    < > = values in this interval not displayed.    Assessment: 78yo female supratherapeutic on heparin with initial dosing while Eliquis on hold; no gtt issues or signs of bleeding per RN.  Goal of Therapy:  aPTT 66-102 seconds   Plan:  Will decrease heparin gtt by 2 units/kg/hr to 800 units/hr and check PTT in 8 hours.    Wynona Neat, PharmD, BCPS  09/03/2018,6:20 AM

## 2018-09-03 NOTE — Progress Notes (Addendum)
PROGRESS NOTE    Teresa Peck  Teresa Peck:[id number] DOB: 1940-11-17 DOA: 08/31/2018 PCP: Deland Pretty, MD    Brief Narrative:   Teresa Peck  is a 78 y.o. female with past medical history remarkable for persistent atrial fibrillation, CAD with occluded LAD status post PCI/stent to first diagonal, chronic systolic congestive heart failure, essential hypertension, type 2 diabetes mellitus and osteoarthritis who was apparently found down on the floor by family secondary to mechanical fall.  She was apparently found in the bathroom and may have been there for up to 2-3 days.  Upon EMS arrival, patient was coherent and answers questions appropriately.  She had been constipated recently and taking stool softeners with multiple trips to the bathroom that evening and she apparently slipped and fell in her bathroom where she was found.  In the ED, patient was noted to have WBC count of 16.5, hemoglobin 16.8, platelets 151, sodium 135, potassium 5.3, BUN 67, creatinine 1.73, glucose 433, anion gap 17, bicarb 18, AST 121, ALT 109, alk phos 65.  Patient was given 1 L normal saline bolus.  Patient was referred for admission by EDP for DKA, mild rhabdomyolysis, and A. fib with RVR.  Assessment & Plan:   Principal Problem:   DKA (diabetic ketoacidoses) ([place]) Active Problems:   CAD (coronary artery disease)   Chronic systolic CHF (congestive heart failure) (HCC)   Chronic atrial fibrillation   ARF (acute renal failure) (HCC)   Atrial fibrillation with RVR (HCC)   Hyperkalemia   Abnormal liver function  Acute metabolic encephalopathy: resolved Hypotension/Shock secondary to medication side effect with narcotics: resolved Patient with worsening hypotension on 7/14 SBP in the mid 60s with MAP of 57 w/ decreased respiratory drive.  No infectious etiology identified; CXR negative, UA negative, no fever or leukocytosis.  Patient has had a few doses of oral narcotics, and did receive a dose of Narcan with  mild improvement.  Etiology likely medication side effect. --s/p 2 doses of narcan with improvement --did not require vasopressors --BP stable but borderline --Tx out of ICU to telemetry --Closely monitor blood pressure --Closely monitor mental status while on narcotics  Diabetic ketoacidosis High anion gap metabolic acidosis Hx U8HF Patient presenting after being found down in her bathroom for likely 2-3 days, likely not taking her home medications during this timeframe.  Patient on metformin 500 mg p.o. twice daily and Lantus 8 units [place] BID at home.  Glucose elevated to 433 on admission with an anion gap of 17 and a CO2 of 18.  Urinalysis with 20 ketones.  Chest x-ray clear, UA unrevealing; unlikely infectious etiology. --Hemoglobin A1c 8.1; fairly well controlled outpatient --Titrated off of insulin drip on 09/01/2018 --Diabetic educator following --Continue Lantus 10 units subcutaneously daily --NovoLog insulin sliding scale for coverage --Supportive care  Persistent atrial fibrillation with RVR Follows with cardiology outpatient, Dr. Acie Fredrickson.  On metoprolol succinate 25 mg p.o. daily and Eliquis 5 mg p.o. twice daily outpatient.  Etiology of her elevated heart rate likely secondary to not taking her medications for the past few days as above. --Metoprolol tartrate 12.5 mg p.o. twice daily; metoprolol 5 mg IV prn for sustained tachycardia --Transition from heparin drip to patient's home Eliquis; pharmacy consulted for assistance --Continue to monitor on telemetry  Mild rhabdomyolysis Mechanical fall CK elevated at 1562.  Patient reports slipped in her bathroom and was unable to get up.  Daughter-in-law reports that she was likely down for 2-3 days.  Etiology likely prolonged downtime. --Continue gentle  IV fluid hydration --CK 1562-->824-->350 --PT/OT consultation for discharge needs  Acute renal failure Obstructive uropathy Creatinine noted to be elevated to 1.73 at time of  admission.  Etiology likely secondary to prerenal azotemia versus ATN from demand ischemia secondary to A. fib with RVR as above.  It was 1.31 on 02/26/2018.  Renal ultrasound with symmetric renal atrophy, no hydronephrosis or other reversible finding. --Creatinine 1.73-->1.48-->1.48-->1.25-->1.69-->1.51 --Avoid nephrotoxins, renally dose all medication --Monitor urinary output, continue foley catheter for now; likely attempt voiding trial on 7/16 when more mobile --Continue to monitor renal function daily  Elevated troponin Troponin 56, 66.  Etiology likely demand ischemia from underlying A. fib with RVR and a mild rhabdomyolysis. --Continue treatment of underlying A. fib with RVR as above --Monitor on telemetry  Transaminitis 2/2 Shock Liver Patient with elevated AST/ALT on admission.  Etiology likely from mild shock liver from underlying A. fib with RVR and prolonged downtime.  Now worsening, likely secondary to hypotension. --AST 121-->90-->1898-->474 --ALT 109-->89-->731-->381 --RUQ U/S with fatty infiltration of the liver --Acute hepatitis panel negative --Continue monitor LFTs daily  Essential hypertension Chronic systolic congestive heart failure; appears compensated CAD Patient follows with cardiology outpatient, Dr. Acie Fredrickson.  Patient with history of LAD occlusion with stenting to first diagonal.  Blood pressure well controlled 120/59; although patient noted to be in A. fib with RVR as above secondary to being without her medications over the past 2-3 days.  Last TTE October 2017 with EF 50-55%. --TTE 7/13 with EF 30-35%, mid to apical anterior septal/anterior wall akinetic, normal RV systolic function, LA mildly dilated, trivial pericardial effusion, trivial MR --Metoprolol tartrate 12.5 mg p.o. twice daily --Continue statin  DVT prophylaxis: Heparin, transition back to Eliquis today Code Status: Full code Family Communication: None Disposition Plan: Transfer out of ICU to  telemetry today, further to be determined   Consultants:  PCCM - signed off 7/15  Procedures:  Transthoracic echocardiogram: 7/13 IMPRESSIONS      1. The left ventricle has moderate-severely reduced systolic function, with an ejection fraction of 30-35%. The cavity size was normal. Left ventricular diastolic Doppler parameters are indeterminate. Technically difficult study with poor windows to  assess wall motion. Definity may have helped but was not used. The mid to apical anteroseptal and anterior walls as well as the apical inferior wall and the true apex appeared akinetic.  2. The right ventricle has normal systolic function. The cavity was normal. There is no increase in right ventricular wall thickness.  3. Left atrial size was mildly dilated.  4. Trivial pericardial effusion is present.  5. The mitral valve is degenerative. Moderate calcification of the mitral valve leaflet. There is moderate mitral annular calcification present. No evidence of mitral valve stenosis. Trivial mitral regurgitation.  6. The aortic valve is tricuspid. Moderate calcification of the aortic valve. No stenosis of the aortic valve.  7. The ascending aorta and aortic root are normal in size and structure.  8. Normal IVC size. PA systolic pressure 27 mmHg.  Antimicrobials:  None   Subjective:  Patient seen and examined at bedside this morning, somnolent.  Noted with significant hypotension.  Nursing concerned regarding her progressive decline overnight.  Has been taking some narcotics.  Did receive a dose of Narcan overnight with no appreciable change.  Transaminitis worsening likely secondary to hypotension.  Patient with soft respiratory rate; and will open eyes briefly to command.  No infectious etiology identified as patient is afebrile without leukocytosis.  Suspect etiology narcotic side effect versus  worsening heart failure.  Will start patient on vasopressors, phenylephrine and transfer to ICU.   Discussed with PCCM who will see in consultation.  Objective: Vitals:   09/03/18 0400 09/03/18 0427 09/03/18 0600 09/03/18 0916  BP:   93/76 113/68  Pulse: 84  76 76  Resp: 18  17   Temp:      TempSrc:      SpO2: 97%  97%   Weight:  78.9 kg    Height:        Intake/Output Summary (Last 24 hours) at 09/03/2018 0928 Last data filed at 09/03/2018 0600 Gross per 24 hour  Intake 2591.45 ml  Output 510 ml  Net 2081.45 ml   Filed Weights   08/31/18 2229 09/01/18 0446 09/03/18 0427  Weight: 78.9 kg 74.2 kg 78.9 kg    Examination:  General exam: No acute distress HEENT: Ecchymosis noted to right face and right neck, pupils 3 mm bilaterally, equal round reactive to light Respiratory system: Clear to auscultation. Respiratory effort normal with slightly decreased drive, on room air Cardiovascular system: Irregularly irregular rhythm, normal rate. No JVD, murmurs, rubs, gallops or clicks. No pedal edema. Gastrointestinal system: Abdomen is nondistended, soft and nontender. No organomegaly or masses felt. Normal bowel sounds heard. Central nervous system: Somnolent, opens eyes briefly to command Extremities: Moving all extremities independently Skin: Ecchymosis noted to neck/face Psychiatry: Depressed mood, flat affect    Data Reviewed: I have personally reviewed following labs and imaging studies  CBC: Recent Labs  Lab 08/31/18 2318 09/01/18 0345 09/02/18 0648 09/02/18 1431 09/03/18 0516  WBC 16.5* 15.5* 7.0 13.2* 10.8*  NEUTROABS 14.8*  --   --   --   --   HGB 16.8* 15.2* 14.7 13.1 12.9  HCT 49.0* 45.4 44.1 39.5 39.8  MCV 93.7 96.2 96.7 98.0 99.5  PLT 151 181 167 159 144*   Basic Metabolic Panel: Recent Labs  Lab 09/01/18 1514 09/01/18 1940 09/02/18 0648 09/02/18 1431 09/03/18 0516  NA 141 139 137 138 139  K 3.7 4.0 3.9 4.4 4.3  CL 109 108 103 108 108  CO2 19* 22 21* 22 19*  GLUCOSE 145* 218* 226* 199* 120*  BUN 65* 67* 70* 73* 62*  CREATININE 1.19* 1.25*  1.69* 1.82* 1.51*  CALCIUM 8.6* 8.6* 8.9 8.6* 8.5*  MG  --   --  2.2  --  2.1  PHOS  --   --   --   --  2.9   GFR: Estimated Creatinine Clearance: 30.2 mL/min (A) (by C-G formula based on SCr of 1.51 mg/dL (H)). Liver Function Tests: Recent Labs  Lab 08/31/18 2318 09/01/18 0345 09/02/18 0648 09/02/18 1431 09/03/18 0516  AST 121* 90* 1,898* 1,134* 474*  ALT 109* 89* 731* 584* 381*  ALKPHOS 65 57 168* 154* 145*  BILITOT QUANTITY NOT SUFFICIENT, UNABLE TO PERFORM TEST 1.4* 3.6* 3.6* 3.7*  PROT 7.1 6.0* 5.9* 5.3* 5.4*  ALBUMIN 3.0* 2.6* 2.7* 2.3* 2.1*   No results for input(s): LIPASE, AMYLASE in the last 168 hours. No results for input(s): AMMONIA in the last 168 hours. Coagulation Profile: Recent Labs  Lab 09/02/18 1431 09/03/18 0516  INR 2.5* 2.6*   Cardiac Enzymes: Recent Labs  Lab 08/31/18 2318 09/01/18 0310 09/02/18 0648 09/03/18 0516  CKTOTAL QUANTITY NOT SUFFICIENT, UNABLE TO PERFORM TEST 1,562* 824* 350*  CKMB  --   --  13.5*  --    BNP (last 3 results) No results for input(s): PROBNP in the last 8760  hours. HbA1C: Recent Labs    09/01/18 0345 09/03/18 0516  HGBA1C 8.1* 8.2*   CBG: Recent Labs  Lab 09/02/18 1539 09/02/18 1922 09/02/18 2332 09/03/18 0332 09/03/18 0756  GLUCAP 164* 117* 93 98 98   Lipid Profile: No results for input(s): CHOL, HDL, LDLCALC, TRIG, CHOLHDL, LDLDIRECT in the last 72 hours. Thyroid Function Tests: Recent Labs    09/01/18 0345  TSH 0.693   Anemia Panel: No results for input(s): VITAMINB12, FOLATE, FERRITIN, TIBC, IRON, RETICCTPCT in the last 72 hours. Sepsis Labs: Recent Labs  Lab 09/02/18 1431  LATICACIDVEN 1.2    Recent Results (from the past 240 hour(s))  SARS Coronavirus 2 (CEPHEID - Performed in [facility] hospital lab), Hosp Order     Status: None   Collection Time: 09/01/18  2:00 AM   Specimen: Nasopharyngeal Swab  Result Value Ref Range Status   SARS Coronavirus 2 NEGATIVE NEGATIVE Final     Comment: (NOTE) If result is NEGATIVE SARS-CoV-2 target nucleic acids are NOT DETECTED. The SARS-CoV-2 RNA is generally detectable in upper and lower  respiratory specimens during the acute phase of infection. The lowest  concentration of SARS-CoV-2 viral copies this assay can detect is 250  copies / mL. A negative result does not preclude SARS-CoV-2 infection  and should not be used as the sole basis for treatment or other  patient management decisions.  A negative result may occur with  improper specimen collection / handling, submission of specimen other  than nasopharyngeal swab, presence of viral mutation(s) within the  areas targeted by this assay, and inadequate number of viral copies  (<250 copies / mL). A negative result must be combined with clinical  observations, patient history, and epidemiological information. If result is POSITIVE SARS-CoV-2 target nucleic acids are DETECTED. The SARS-CoV-2 RNA is generally detectable in upper and lower  respiratory specimens dur ing the acute phase of infection.  Positive  results are indicative of active infection with SARS-CoV-2.  Clinical  correlation with patient history and other diagnostic information is  necessary to determine patient infection status.  Positive results do  not rule out bacterial infection or co-infection with other viruses. If result is PRESUMPTIVE POSTIVE SARS-CoV-2 nucleic acids MAY BE PRESENT.   A presumptive positive result was obtained on the submitted specimen  and confirmed on repeat testing.  While 2019 novel coronavirus  (SARS-CoV-2) nucleic acids may be present in the submitted sample  additional confirmatory testing may be necessary for epidemiological  and / or clinical management purposes  to differentiate between  SARS-CoV-2 and other Sarbecovirus currently known to infect humans.  If clinically indicated additional testing with an alternate test  methodology [phone]) is advised. The SARS-CoV-2  RNA is generally  detectable in upper and lower respiratory sp ecimens during the acute  phase of infection. The expected result is Negative. Fact Sheet for Patients:  StrictlyIdeas.no Fact Sheet for Healthcare Providers: BankingDealers.co.za This test is not yet approved or cleared by the [place] FDA and has been authorized for detection and/or diagnosis of SARS-CoV-2 by FDA under an Emergency Use Authorization (EUA).  This EUA will remain in effect (meaning this test can be used) for the duration of the COVID-19 declaration under Section 564(b)(1) of the Act, 21 U.S.C. section 360bbb-3(b)(1), unless the authorization is terminated or revoked sooner. Performed at [facility], [place] [address], [place], [place] [address]   MRSA PCR Screening     Status: None   Collection Time: 09/02/18 11:28  AM   Specimen: Nasal Mucosa; Nasopharyngeal  Result Value Ref Range Status   MRSA by PCR NEGATIVE NEGATIVE Final    Comment:        The GeneXpert MRSA Assay (FDA approved for NASAL specimens only), is one component of a comprehensive MRSA colonization surveillance program. It is not intended to diagnose MRSA infection nor to guide or monitor treatment for MRSA infections. Performed at [facility], [place] [address], [place], [place] [address]          Radiology Studies: Dg Chest Port 1 View  Result Date: 09/03/2018 CLINICAL DATA:  Respiratory failure EXAM: PORTABLE CHEST 1 VIEW COMPARISON:  08/31/2018 FINDINGS: Cardiac shadow is stable. Aortic calcifications are seen. Mild bibasilar atelectasis is noted. No focal confluent infiltrate is seen. No bony abnormality is noted. IMPRESSION: Mild bibasilar atelectasis. Electronically Signed   By: Inez Catalina M.D.   On: 09/03/2018 07:17        Scheduled Meds:  allopurinol  100 mg Oral Daily   Chlorhexidine Gluconate Cloth  6 each Topical Q0600   gabapentin  300 mg Oral BID    insulin aspart  0-5 Units Subcutaneous QHS   insulin aspart  0-9 Units Subcutaneous TID WC   insulin glargine  10 Units Subcutaneous Daily   magnesium oxide  400 mg Oral Daily   metoprolol tartrate  12.5 mg Oral BID   white petrolatum       Continuous Infusions:  heparin 800 Units/hr (09/03/18 0808)   lactated ringers 75 mL/hr at 09/03/18 0600     LOS: 2 days    Time spent: 39 minutes     Ixel Boehning J [place] DO [facility] Pager [phone]  If 7PM-7AM, please contact night-coverage [email or web address] Password [facility] 09/03/2018, 9:28 AM

## 2018-09-03 NOTE — Evaluation (Signed)
Clinical/Bedside Swallow Evaluation Patient Details  Name: Teresa Peck MRN: [id number] Date of Birth: 04-27-1940  Today's Date: 09/03/2018 Time: SLP Start Time (ACUTE ONLY): 6734 SLP Stop Time (ACUTE ONLY): 1646 SLP Time Calculation (min) (ACUTE ONLY): 13 min  Past Medical History:  Past Medical History:  Diagnosis Date  . Anemia   . Arthritis   . CAD (coronary artery disease)   . CAD (coronary artery disease)   . Cancer ([place])    skin cancer  . CHF (congestive heart failure) ([place])   . Coronary artery disease    MI 2004-stent  . Diabetes mellitus   . Diabetic neuropathy ([place])   . GERD (gastroesophageal reflux disease)    tums  . Gout   . Hiatal hernia   . Hyperlipidemia   . Hypertension   . PAD (peripheral artery disease) ([place])   . Tremor    Past Surgical History:  Past Surgical History:  Procedure Laterality Date  . BILATERAL CORNEA IMPLANTS  2010  . CARDIAC CATHETERIZATION     stent post MI   . CHOLECYSTECTOMY    . CORONARY ANGIOPLASTY     2004  stent  . EYE SURGERY    . [place]   right  . LUMBAR LAMINECTOMY/DECOMPRESSION MICRODISCECTOMY N/A 04/13/2015   Procedure: L4-5 Decompression;  Surgeon: Marybelle Killings, MD;  Location: [place];  Service: Orthopedics;  Laterality: N/A;  Need RNFA   HPI:  78 year old with PMH including hypertension, hyperlipidemia, Dm2 w neuropathy, CAD , CHF , PAD, Anemia found 7/12 after being down on the floor for 2 to 3 days after mechanical fall.  Noted to have DKA, rhabdomyolysis, A. fib with RVR, elevated LFTs.  Overall she had been improving.  Rapid response called, transferred to ICU on 7/14 for altered mental status, hypotension.    Assessment / Plan / Recommendation Clinical Impression  Pt has no overt signs of aspiration, but she does appear to be at risk given her current drowsy and confused state. She has reduced bolus awareness and although she masticates small bites of solids, she does not inititate posterior transit  or swallow. She does swallow with more automaticity with thin liquids and purees, although at times also showing multiple subswallows. Recommend a downgrade to Dys 1 diet and thin liquids pending improvements in mentation.  SLP Visit Diagnosis: Dysphagia, oral phase (R13.11)    Aspiration Risk  Mild aspiration risk;Moderate aspiration risk    Diet Recommendation Dysphagia 1 (Puree);Thin liquid   Liquid Administration via: Cup;Straw Medication Administration: Crushed with puree Supervision: Staff to assist with self feeding;Full supervision/cueing for compensatory strategies Compensations: Minimize environmental distractions;Slow rate;Small sips/bites Postural Changes: Remain upright for at least 30 minutes after po intake;Seated upright at 90 degrees    Other  Recommendations Oral Care Recommendations: Oral care BID   Follow up Recommendations Skilled Nursing facility      Frequency and Duration min 2x/week  2 weeks       Prognosis Prognosis for Safe Diet Advancement: Good      Swallow Study   General HPI: 78 year old with PMH including hypertension, hyperlipidemia, Dm2 w neuropathy, CAD , CHF , PAD, Anemia found 7/12 after being down on the floor for 2 to 3 days after mechanical fall.  Noted to have DKA, rhabdomyolysis, A. fib with RVR, elevated LFTs.  Overall she had been improving.  Rapid response called, transferred to ICU on 7/14 for altered mental status, hypotension.  Type of Study: Bedside Swallow Evaluation Previous Swallow  Assessment: none in chart Diet Prior to this Study: Regular;Thin liquids Temperature Spikes Noted: No Respiratory Status: Room air History of Recent Intubation: No Behavior/Cognition: Lethargic/Drowsy;Confused;Requires cueing Oral Cavity Assessment: Dry Oral Care Completed by SLP: No Oral Cavity - Dentition: Adequate natural dentition;Poor condition Self-Feeding Abilities: Needs assist Patient Positioning: Upright in bed Baseline Vocal Quality:  Normal Volitional Swallow: Unable to elicit    Oral/Motor/Sensory Function Overall Oral Motor/Sensory Function: (pt not following commands well to assess)   Ice Chips Ice chips: Impaired Presentation: Spoon Oral Phase Impairments: Poor awareness of bolus   Thin Liquid Thin Liquid: Impaired Presentation: Cup;Self Fed;Straw;Spoon Pharyngeal  Phase Impairments: Suspected delayed Swallow;Multiple swallows    Nectar Thick Nectar Thick Liquid: Not tested   Honey Thick Honey Thick Liquid: Not tested   Puree Puree: Impaired Presentation: Spoon Pharyngeal Phase Impairments: Multiple swallows   Solid     Solid: Impaired Oral Phase Impairments: Poor awareness of bolus;Impaired mastication Oral Phase Functional Implications: Oral residue Pharyngeal Phase Impairments: Unable to trigger swallow      Venita Sheffield Barry [place] 09/03/2018,4:56 PM   Pollyann Glen, M.A. [place] Acute Environmental education officer [phone] Office [phone]

## 2018-09-03 NOTE — Evaluation (Signed)
Physical Therapy Evaluation Patient Details Name: Teresa Peck MRN: [id number] DOB: September 22, 1940 Today's Date: 09/03/2018   History of Present Illness  78 year old with PMH including hypertension, hyperlipidemia, Dm2 w neuropathy, CAD , CHF , PAD, Anemia found 7/12 after being down on the floor for 2 to 3 days after mechanical fall.  Noted to have DKA, rhabdomyolysis, A. fib with RVR, elevated LFTs.  Overall she had been improving.  Rapid response called, transferred to ICU on 7/14 for altered mental status, hypotension.    Clinical Impression  Pt admitted with above diagnosis. Pt currently with functional limitations due to the deficits listed below (see PT Problem List). PTA, pt living alone independent. Today, pt with AMS vs Delirium, following some cues, reports high pain. Max A to come to sitting and unable to stand despite attempts with total A of 2.  Pt will benefit from skilled PT to increase their independence and safety with mobility to allow discharge to the venue listed below.       Follow Up Recommendations SNF    Equipment Recommendations  (TBD )    Recommendations for Other Services       Precautions / Restrictions Precautions Precautions: Fall(Delirium) Restrictions Weight Bearing Restrictions: No      Mobility  Bed Mobility Overal bed mobility: Needs Assistance Bed Mobility: Supine to Sit;Sit to Supine     Supine to sit: Mod assist;+2 for physical assistance;+2 for safety/equipment;HOB elevated Sit to supine: Max assist;+2 for physical assistance;+2 for safety/equipment   General bed mobility comments: mod A +2 for all aspects of bed mobility. multimodal cues throughout  Transfers Overall transfer level: Needs assistance Equipment used: 2 person hand held assist Transfers: Sit to/from Stand Sit to Stand: Max assist;+2 physical assistance;+2 safety/equipment;From elevated surface         General transfer comment: max A +2 for multimodal cues and Pt  unable to achieve full upright or bring buttocks clear of bed  Ambulation/Gait                Stairs            Wheelchair Mobility    Modified Rankin (Stroke Patients Only)       Balance Overall balance assessment: Needs assistance Sitting-balance support: Bilateral upper extremity supported;Feet unsupported Sitting balance-Leahy Scale: Poor Sitting balance - Comments: moments of min guard - mostly min A Postural control: Posterior lean Standing balance support: Bilateral upper extremity supported Standing balance-Leahy Scale: Zero Standing balance comment: dependent on external support from therapists and gait belt essential                             Pertinent Vitals/Pain Pain Assessment: 0-10 Pain Score: 10-Worst pain ever Pain Location: Neck, Bil Knees Pain Descriptors / Indicators: Constant;Moaning;Discomfort;Grimacing    Home Living Family/patient expects to be discharged to:: Private residence Living Arrangements: Alone Available Help at Discharge: Family;Available PRN/intermittently(son and daughter in law live next door) Type of Home: House Home Access: Stairs to enter Entrance Stairs-Rails: Left;Right;Can reach both Entrance Stairs-Number of Steps: 6 Home Layout: One level Home Equipment: Environmental consultant - 2 wheels;Cane - single point      Prior Function Level of Independence: Independent         Comments: Pt (reliability?) reports that she was doing all IADL and ADL and driving PTA     Hand Dominance   Dominant Hand: Right    Extremity/Trunk Assessment   Upper Extremity Assessment  Upper Extremity Assessment: Generalized weakness    Lower Extremity Assessment Lower Extremity Assessment: Generalized weakness    Cervical / Trunk Assessment Cervical / Trunk Assessment: Kyphotic  Communication   Communication: HOH  Cognition Arousal/Alertness: Awake/alert Behavior During Therapy: Restless Overall Cognitive Status:  Impaired/Different from baseline Area of Impairment: Orientation;Attention;Memory;Following commands;Safety/judgement;Awareness;Problem solving                 Orientation Level: Disoriented to;Time;Situation Current Attention Level: Focused Memory: Decreased short-term memory Following Commands: Follows one step commands inconsistently Safety/Judgement: Decreased awareness of safety;Decreased awareness of deficits Awareness: Intellectual Problem Solving: Slow processing;Decreased initiation;Difficulty sequencing;Requires verbal cues;Requires tactile cues General Comments: Pt seeing bugs, unable to recall information told multiple times      General Comments General comments (skin integrity, edema, etc.): VSS throughout session on RA    Exercises     Assessment/Plan    PT Assessment Patient needs continued PT services  PT Problem List Decreased strength       PT Treatment Interventions DME instruction;Gait training;Stair training;Functional mobility training;Therapeutic exercise;Therapeutic activities    PT Goals (Current goals can be found in the Care Plan section)  Acute Rehab PT Goals Patient Stated Goal: "make my neck feel better" PT Goal Formulation: With patient Potential to Achieve Goals: Good    Frequency Min 2X/week   Barriers to discharge        Co-evaluation PT/OT/SLP Co-Evaluation/Treatment: Yes Reason for Co-Treatment: Complexity of the patient's impairments (multi-system involvement);Necessary to address cognition/behavior during functional activity PT goals addressed during session: Mobility/safety with mobility         AM-PAC PT "6 Clicks" Mobility  Outcome Measure Help needed turning from your back to your side while in a flat bed without using bedrails?: A Little Help needed moving from lying on your back to sitting on the side of a flat bed without using bedrails?: A Little Help needed moving to and from a bed to a chair (including a  wheelchair)?: A Lot Help needed standing up from a chair using your arms (e.g., wheelchair or bedside chair)?: Total Help needed to walk in hospital room?: Total Help needed climbing 3-5 steps with a railing? : Total 6 Click Score: 11    End of Session Equipment Utilized During Treatment: Gait belt Activity Tolerance: Patient tolerated treatment well Patient left: in bed;with call bell/phone within reach;with bed alarm set Nurse Communication: Mobility status PT Visit Diagnosis: Unsteadiness on feet (R26.81)    Time: 1047-1110 PT Time Calculation (min) (ACUTE ONLY): 23 min   Charges:   PT Evaluation $PT Eval High Complexity: 1 High          Reinaldo Berber, PT, DPT Acute Rehabilitation Services Pager: [phone] Office: [phone]    Reinaldo Berber 09/03/2018, 2:00 PM

## 2018-09-03 NOTE — Progress Notes (Signed)
[name] Progress Note Patient Name: Teresa Peck DOB: [birth year] MRN: [id number]   Date of Service  09/03/2018  HPI/Events of Note  Pt complaining of pain after oxycodone discontinued for sedation  eICU Interventions  Oxycodone 5 mg Q 6 hours prn        Frederik Pear 09/03/2018, 4:34 AM

## 2018-09-03 NOTE — Progress Notes (Signed)
[facility] for converting IV heparin to Eliquis Indication: atrial fibrillation  Allergies  Allergen Reactions  . Sulfur Rash    Rash/flushed, blood red eyes    Patient Measurements: Height: 5\' 4"  (162.6 cm) Weight: 173 lb 15.1 oz (78.9 kg) IBW/kg (Calculated) : 54.7  Vital Signs: Temp: 99.8 F (37.7 C) (07/15 0334) Temp Source: Axillary (07/15 0334) BP: 113/68 (07/15 0916) Pulse Rate: 76 (07/15 0916)  Labs: Recent Labs    09/01/18 0310  09/02/18 7253 09/02/18 1431 09/02/18 1720 09/02/18 2126 09/03/18 0516 09/03/18 0518  HGB  --    < > 14.7 13.1  --   --  12.9  --   HCT  --    < > 44.1 39.5  --   --  39.8  --   PLT  --    < > 167 159  --   --  135*  --   APTT  --   --   --   --   --  38*  --  149*  LABPROT  --   --   --  26.5*  --   --  27.5*  --   INR  --   --   --  2.5*  --   --  2.6*  --   HEPARINUNFRC  --   --   --   --   --  >2.20*  --   --   CREATININE  --    < > 1.69* 1.82*  --   --  1.51*  --   CKTOTAL 1,562*  --  824*  --   --   --  350*  --   CKMB  --   --  13.5*  --   --   --   --   --   TROPONINIHS  --    < >  --  74* 67*  --  60*  --    < > = values in this interval not displayed.    Estimated Creatinine Clearance: 30.2 mL/min (A) (by C-G formula based on SCr of 1.51 mg/dL (H)).   Medical History: Past Medical History:  Diagnosis Date  . Anemia   . Arthritis   . CAD (coronary artery disease)   . CAD (coronary artery disease)   . Cancer ([place])    skin cancer  . CHF (congestive heart failure) ([place])   . Coronary artery disease    MI 2004-stent  . Diabetes mellitus   . Diabetic neuropathy ([place])   . GERD (gastroesophageal reflux disease)    tums  . Gout   . Hiatal hernia   . Hyperlipidemia   . Hypertension   . PAD (peripheral artery disease) ([place])   . Tremor     Medications:  See medication history  Assessment: 78 yo lady to continue home eliquis for afib. Was transitioned to heparin drip  yesterday due to shock liver and AKI. Now both resolving so will transition back to apixaban. Scr still 1.5 which technically qualifies her for reduced dosing but her baseline is <1.5 so will continue with home dose of 5mg  BID and monitor CBC and renal fxn closely.  Goal of Therapy:  Therapeutic anticoagulation Monitor platelets by anticoagulation protocol: Yes   Plan:  Stop heparin drip  Eliquis 5mg  po bid Monitor for bleeding complications  Teresa Peck 09/03/2018,9:59 AM

## 2018-09-04 LAB — GLUCOSE, CAPILLARY
Glucose-Capillary: 138 mg/dL — ABNORMAL HIGH (ref 70–99)
Glucose-Capillary: 174 mg/dL — ABNORMAL HIGH (ref 70–99)
Glucose-Capillary: 236 mg/dL — ABNORMAL HIGH (ref 70–99)

## 2018-09-04 LAB — CBC
HCT: 39.2 % (ref 36.0–46.0)
Hemoglobin: 13.2 g/dL (ref 12.0–15.0)
MCH: 31.9 pg (ref 26.0–34.0)
MCHC: 33.7 g/dL (ref 30.0–36.0)
MCV: 94.7 fL (ref 80.0–100.0)
Platelets: 135 10*3/uL — ABNORMAL LOW (ref 150–400)
RBC: 4.14 MIL/uL (ref 3.87–5.11)
RDW: 14.1 % (ref 11.5–15.5)
WBC: 8.3 10*3/uL (ref 4.0–10.5)
nRBC: 0 % (ref 0.0–0.2)

## 2018-09-04 LAB — COMPREHENSIVE METABOLIC PANEL
ALT: 220 U/L — ABNORMAL HIGH (ref 0–44)
AST: 160 U/L — ABNORMAL HIGH (ref 15–41)
Albumin: 1.9 g/dL — ABNORMAL LOW (ref 3.5–5.0)
Alkaline Phosphatase: 167 U/L — ABNORMAL HIGH (ref 38–126)
Anion gap: 12 (ref 5–15)
BUN: 41 mg/dL — ABNORMAL HIGH (ref 8–23)
CO2: 19 mmol/L — ABNORMAL LOW (ref 22–32)
Calcium: 8.5 mg/dL — ABNORMAL LOW (ref 8.9–10.3)
Chloride: 103 mmol/L (ref 98–111)
Creatinine, Ser: 1.24 mg/dL — ABNORMAL HIGH (ref 0.44–1.00)
GFR calc Af Amer: 48 mL/min — ABNORMAL LOW (ref >60)
GFR calc non Af Amer: 41 mL/min — ABNORMAL LOW (ref >60)
Glucose, Bld: 153 mg/dL — ABNORMAL HIGH (ref 70–99)
Potassium: 3.9 mmol/L (ref 3.5–5.1)
Sodium: 134 mmol/L — ABNORMAL LOW (ref 135–145)
Total Bilirubin: 3.1 mg/dL — ABNORMAL HIGH (ref 0.3–1.2)
Total Protein: 4.8 g/dL — ABNORMAL LOW (ref 6.5–8.1)

## 2018-09-04 MED ORDER — TRAMADOL HCL 50 MG PO TABS
50.0000 mg | ORAL_TABLET | Freq: Once | ORAL | Status: AC | PRN
  Administered 2018-09-04: 50 mg via ORAL
  Filled 2018-09-04: qty 1

## 2018-09-04 NOTE — Progress Notes (Signed)
At approximately 0530, this nurse took a call from an individual who identified himself as the patient's grandson.  I asked the person if he could give me the patient's security password, and he did not give the correct password.  I explained to the individual that without the correct password, I could not give any information and could only direct him to speak with the established point of contact.  The individual asked for that person's contact information, and I explained that I could not give that information.  The individual then asked to speak to the charge nurse, and I explained that I was the charge nurse for this shift.  The individual asked to confirm the patient's room number and if the patient was stable, I told the individual again that I could not give out any information about the patient over the phone without the correct password.  The patient said "thank you" and hung up the phone.  I made the patient's nurse, Domingo Madeira., aware.

## 2018-09-04 NOTE — Progress Notes (Signed)
PROGRESS NOTE    Teresa Peck  DVV:[id number] DOB: 1940-04-22 DOA: 08/31/2018 PCP: Deland Pretty, MD    Brief Narrative:   Teresa Peck  is a 78 y.o. female with past medical history remarkable for persistent atrial fibrillation, CAD with occluded LAD status post PCI/stent to first diagonal, chronic systolic congestive heart failure, essential hypertension, type 2 diabetes mellitus and osteoarthritis who was apparently found down on the floor by family secondary to mechanical fall.  She was apparently found in the bathroom and may have been there for up to 2-3 days.  Upon EMS arrival, patient was coherent and answers questions appropriately.  She had been constipated recently and taking stool softeners with multiple trips to the bathroom that evening and she apparently slipped and fell in her bathroom where she was found.  In the ED, patient was noted to have WBC count of 16.5, hemoglobin 16.8, platelets 151, sodium 135, potassium 5.3, BUN 67, creatinine 1.73, glucose 433, anion gap 17, bicarb 18, AST 121, ALT 109, alk phos 65.  Patient was given 1 L normal saline bolus.  Patient was referred for admission by EDP for DKA, mild rhabdomyolysis, and A. fib with RVR.  Assessment & Plan:   Principal Problem:   DKA (diabetic ketoacidoses) ([place]) Active Problems:   CAD (coronary artery disease)   Chronic systolic CHF (congestive heart failure) (H[facility])   Chronic atrial fibrillation   ARF (acute renal failure) (H[facility])   Atrial fibrillation with RVR ([facility])   Hyperkalemia   Abnormal liver function  Acute metabolic encephalopathy: resolved Hypotension/Shock secondary to medication side effect with narcotics: resolved Patient with worsening hypotension on 7/14 SBP in the mid 60s with MAP of 57 w/ decreased respiratory drive.  No infectious etiology identified; CXR negative, UA negative, no fever or leukocytosis.  Patient has had a few doses of oral narcotics, and did receive a dose of Narcan with  mild improvement.  Etiology likely medication side effect. --s/p 2 doses of narcan with improvement --did not require vasopressors --BP stable but borderline --Closely monitor blood pressure --Closely monitor mental status while on narcotics  Diabetic ketoacidosis High anion gap metabolic acidosis Hx G2IR Patient presenting after being found down in her bathroom for likely 2-3 days, likely not taking her home medications during this timeframe.  Patient on metformin 500 mg p.o. twice daily and Lantus 8 units [place] BID at home.  Glucose elevated to 433 on admission with an anion gap of 17 and a CO2 of 18.  Urinalysis with 20 ketones.  Chest x-ray clear, UA unrevealing; unlikely infectious etiology. --Hemoglobin A1c 8.1; fairly well controlled outpatient --Titrated off of insulin drip on 09/01/2018 --Diabetic educator following --Continue Lantus 10 units subcutaneously daily --NovoLog insulin sliding scale for coverage --Supportive care  Persistent atrial fibrillation with RVR Follows with cardiology outpatient, Dr. Acie Fredrickson.  On metoprolol succinate 25 mg p.o. daily and Eliquis 5 mg p.o. twice daily outpatient.  Etiology of her elevated heart rate likely secondary to not taking her medications for the past few days as above. --Metoprolol tartrate 12.5 mg p.o. twice daily; metoprolol 5 mg IV prn for sustained tachycardia --Continue Eliquis twice daily for chronic anticoagulation --Continue to monitor on telemetry  Mild rhabdomyolysis Mechanical fall CK elevated at 1562.  Patient reports slipped in her bathroom and was unable to get up.  Daughter-in-law reports that she was likely down for 2-3 days.  Etiology likely prolonged downtime. --Continue gentle IV fluid hydration --CK 1562-->824-->350 --PT/OT recommends SNF placement, case management  for coordination  Acute renal failure Obstructive uropathy Creatinine noted to be elevated to 1.73 at time of admission.  Etiology likely secondary to  prerenal azotemia versus ATN from demand ischemia secondary to A. fib with RVR as above.  It was 1.31 on 02/26/2018.  Renal ultrasound with symmetric renal atrophy, no hydronephrosis or other reversible finding. --Creatinine 1.73-->1.48-->1.48-->1.25-->1.69-->1.51-->1.24 --Avoid nephrotoxins, renally dose all medication --Attempt voiding trial today, will discontinue Foley and bladder scan post void --Continue to monitor renal function daily  Elevated troponin Troponin 56, 66.  Etiology likely demand ischemia from underlying A. fib with RVR and a mild rhabdomyolysis. --Continue treatment of underlying A. fib with RVR as above --Monitor on telemetry  Transaminitis 2/2 Shock Liver Patient with elevated AST/ALT on admission.  Etiology likely from mild shock liver from underlying A. fib with RVR and prolonged downtime.  Now worsening, likely secondary to hypotension. --AST 121-->90-->1898-->474-->160 --ALT 109-->89-->731-->381-->220 --RUQ U/S with fatty infiltration of the liver --Acute hepatitis panel negative --Continue monitor LFTs daily  Essential hypertension Chronic systolic congestive heart failure; appears compensated CAD Patient follows with cardiology outpatient, Dr. Acie Fredrickson.  Patient with history of LAD occlusion with stenting to first diagonal.  Blood pressure well controlled 120/59; although patient noted to be in A. fib with RVR as above secondary to being without her medications over the past 2-3 days.  Last TTE October 2017 with EF 50-55%. --TTE 7/13 with EF 30-35%, mid to apical anterior septal/anterior wall akinetic, normal RV systolic function, LA mildly dilated, trivial pericardial effusion, trivial MR --Metoprolol tartrate 12.5 mg p.o. twice daily --Continue statin  DVT prophylaxis: Eliquis Code Status: Full code Family Communication: None Disposition Plan: Continue close monitoring of blood pressure, heart rate, discontinue Foley, will need SNF placement, social work for  coordination.   Consultants:   PCCM - signed off 7/15  Procedures:  Transthoracic echocardiogram: 7/13 IMPRESSIONS      1. The left ventricle has moderate-severely reduced systolic function, with an ejection fraction of 30-35%. The cavity size was normal. Left ventricular diastolic Doppler parameters are indeterminate. Technically difficult study with poor windows to  assess wall motion. Definity may have helped but was not used. The mid to apical anteroseptal and anterior walls as well as the apical inferior wall and the true apex appeared akinetic.  2. The right ventricle has normal systolic function. The cavity was normal. There is no increase in right ventricular wall thickness.  3. Left atrial size was mildly dilated.  4. Trivial pericardial effusion is present.  5. The mitral valve is degenerative. Moderate calcification of the mitral valve leaflet. There is moderate mitral annular calcification present. No evidence of mitral valve stenosis. Trivial mitral regurgitation.  6. The aortic valve is tricuspid. Moderate calcification of the aortic valve. No stenosis of the aortic valve.  7. The ascending aorta and aortic root are normal in size and structure.  8. Normal IVC size. PA systolic pressure 27 mmHg.  Antimicrobials:   None   Subjective:  Patient seen and examined at bedside this morning, resting comfortably.  Complains of diffuse pain.  Evaluated by PT/OT yesterday with recommendations of SNF placement.  Blood sugars improved.  Appetite improving.  No other complaints at this time.  Denies headache, no fever/chills/night sweats, no chest pain, palpitations, no abdominal pain, no cough/congestion.  No acute events overnight per nurse staff.  Objective: Vitals:   09/04/18 0012 09/04/18 0147 09/04/18 0419 09/04/18 0754  BP: 111/78  112/82 125/72  Pulse: 95  89 81  Resp: 18  18   Temp: 98.3 F (36.8 C)  98.6 F (37 C) 97.6 F (36.4 C)  TempSrc: Oral  Oral Oral  SpO2:  94%  95% 96%  Weight:  82 kg    Height:        Intake/Output Summary (Last 24 hours) at 09/04/2018 1019 Last data filed at 09/04/2018 0542 Gross per 24 hour  Intake 1472.21 ml  Output 1400 ml  Net 72.21 ml   Filed Weights   09/01/18 0446 09/03/18 0427 09/04/18 0147  Weight: 74.2 kg 78.9 kg 82 kg    Examination:  General exam: No acute distress, alert and oriented x4 HEENT: Ecchymosis noted to right face and right neck, pupils 3 mm bilaterally, equal round reactive to light Respiratory system: Clear to auscultation. Respiratory effort normal with slightly decreased drive, on room air Cardiovascular system: Irregularly irregular rhythm, normal rate. No JVD, murmurs, rubs, gallops or clicks. No pedal edema. Gastrointestinal system: Abdomen is nondistended, soft and nontender. No organomegaly or masses felt. Normal bowel sounds heard. Central nervous system: Somnolent, opens eyes briefly to command Extremities: Moving all extremities independently Skin: Ecchymosis noted to neck/face Psychiatry: Depressed mood, flat affect    Data Reviewed: I have personally reviewed following labs and imaging studies  CBC: Recent Labs  Lab 08/31/18 2318 09/01/18 0345 09/02/18 0648 09/02/18 1431 09/03/18 0516 09/04/18 0826  WBC 16.5* 15.5* 7.0 13.2* 10.8* 8.3  NEUTROABS 14.8*  --   --   --   --   --   HGB 16.8* 15.2* 14.7 13.1 12.9 13.2  HCT 49.0* 45.4 44.1 39.5 39.8 39.2  MCV 93.7 96.2 96.7 98.0 99.5 94.7  PLT 151 181 167 159 135* 086*   Basic Metabolic Panel: Recent Labs  Lab 09/01/18 1940 09/02/18 0648 09/02/18 1431 09/03/18 0516 09/04/18 0317  NA 139 137 138 139 134*  K 4.0 3.9 4.4 4.3 3.9  CL 108 103 108 108 103  CO2 22 21* 22 19* 19*  GLUCOSE 218* 226* 199* 120* 153*  BUN 67* 70* 73* 62* 41*  CREATININE 1.25* 1.69* 1.82* 1.51* 1.24*  CALCIUM 8.6* 8.9 8.6* 8.5* 8.5*  MG  --  2.2  --  2.1  --   PHOS  --   --   --  2.9  --    GFR: Estimated Creatinine Clearance: 37.5  mL/min (A) (by C-G formula based on SCr of 1.24 mg/dL (H)). Liver Function Tests: Recent Labs  Lab 09/01/18 0345 09/02/18 0648 09/02/18 1431 09/03/18 0516 09/04/18 0317  AST 90* 1,898* 1,134* 474* 160*  ALT 89* 731* 584* 381* 220*  ALKPHOS 57 168* 154* 145* 167*  BILITOT 1.4* 3.6* 3.6* 3.7* 3.1*  PROT 6.0* 5.9* 5.3* 5.4* 4.8*  ALBUMIN 2.6* 2.7* 2.3* 2.1* 1.9*   No results for input(s): LIPASE, AMYLASE in the last 168 hours. No results for input(s): AMMONIA in the last 168 hours. Coagulation Profile: Recent Labs  Lab 09/02/18 1431 09/03/18 0516  INR 2.5* 2.6*   Cardiac Enzymes: Recent Labs  Lab 08/31/18 2318 09/01/18 0310 09/02/18 0648 09/03/18 0516  CKTOTAL QUANTITY NOT SUFFICIENT, UNABLE TO PERFORM TEST 1,562* 824* 350*  CKMB  --   --  13.5*  --    BNP (last 3 results) No results for input(s): PROBNP in the last 8760 hours. HbA1C: Recent Labs    09/03/18 0516  HGBA1C 8.2*   CBG: Recent Labs  Lab 09/03/18 0756 09/03/18 1139 09/03/18 1608 09/03/18 2227 09/04/18 0753  GLUCAP 98  95 78 158* 138*   Lipid Profile: No results for input(s): CHOL, HDL, LDLCALC, TRIG, CHOLHDL, LDLDIRECT in the last 72 hours. Thyroid Function Tests: No results for input(s): TSH, T4TOTAL, FREET4, T3FREE, THYROIDAB in the last 72 hours. Anemia Panel: No results for input(s): VITAMINB12, FOLATE, FERRITIN, TIBC, IRON, RETICCTPCT in the last 72 hours. Sepsis Labs: Recent Labs  Lab 09/02/18 1431  LATICACIDVEN 1.2    Recent Results (from the past 240 hour(s))  SARS Coronavirus 2 (CEPHEID - Performed in [facility] hospital lab), Hosp Order     Status: None   Collection Time: 09/01/18  2:00 AM   Specimen: Nasopharyngeal Swab  Result Value Ref Range Status   SARS Coronavirus 2 NEGATIVE NEGATIVE Final    Comment: (NOTE) If result is NEGATIVE SARS-CoV-2 target nucleic acids are NOT DETECTED. The SARS-CoV-2 RNA is generally detectable in upper and lower  respiratory specimens  during the acute phase of infection. The lowest  concentration of SARS-CoV-2 viral copies this assay can detect is 250  copies / mL. A negative result does not preclude SARS-CoV-2 infection  and should not be used as the sole basis for treatment or other  patient management decisions.  A negative result may occur with  improper specimen collection / handling, submission of specimen other  than nasopharyngeal swab, presence of viral mutation(s) within the  areas targeted by this assay, and inadequate number of viral copies  (<250 copies / mL). A negative result must be combined with clinical  observations, patient history, and epidemiological information. If result is POSITIVE SARS-CoV-2 target nucleic acids are DETECTED. The SARS-CoV-2 RNA is generally detectable in upper and lower  respiratory specimens dur ing the acute phase of infection.  Positive  results are indicative of active infection with SARS-CoV-2.  Clinical  correlation with patient history and other diagnostic information is  necessary to determine patient infection status.  Positive results do  not rule out bacterial infection or co-infection with other viruses. If result is PRESUMPTIVE POSTIVE SARS-CoV-2 nucleic acids MAY BE PRESENT.   A presumptive positive result was obtained on the submitted specimen  and confirmed on repeat testing.  While 2019 novel coronavirus  (SARS-CoV-2) nucleic acids may be present in the submitted sample  additional confirmatory testing may be necessary for epidemiological  and / or clinical management purposes  to differentiate between  SARS-CoV-2 and other Sarbecovirus currently known to infect humans.  If clinically indicated additional testing with an alternate test  methodology [phone]) is advised. The SARS-CoV-2 RNA is generally  detectable in upper and lower respiratory sp ecimens during the acute  phase of infection. The expected result is Negative. Fact Sheet for Patients:   StrictlyIdeas.no Fact Sheet for Healthcare Providers: BankingDealers.co.za This test is not yet approved or cleared by the [place] FDA and has been authorized for detection and/or diagnosis of SARS-CoV-2 by FDA under an Emergency Use Authorization (EUA).  This EUA will remain in effect (meaning this test can be used) for the duration of the COVID-19 declaration under Section 564(b)(1) of the Act, 21 U.S.C. section 360bbb-3(b)(1), unless the authorization is terminated or revoked sooner. Performed at [facility], [place] [address] [place], [place] [address]   MRSA PCR Screening     Status: None   Collection Time: 09/02/18 11:28 AM   Specimen: Nasal Mucosa; Nasopharyngeal  Result Value Ref Range Status   MRSA by PCR NEGATIVE NEGATIVE Final    Comment:        The  GeneXpert MRSA Assay (FDA approved for NASAL specimens only), is one component of a comprehensive MRSA colonization surveillance program. It is not intended to diagnose MRSA infection nor to guide or monitor treatment for MRSA infections. Performed at [facility], [place] [address] [place], [place] [address]          Radiology Studies: Dg Chest Port 1 View  Result Date: 09/03/2018 CLINICAL DATA:  Respiratory failure EXAM: PORTABLE CHEST 1 VIEW COMPARISON:  08/31/2018 FINDINGS: Cardiac shadow is stable. Aortic calcifications are seen. Mild bibasilar atelectasis is noted. No focal confluent infiltrate is seen. No bony abnormality is noted. IMPRESSION: Mild bibasilar atelectasis. Electronically Signed   By: Inez Catalina M.D.   On: 09/03/2018 07:17        Scheduled Meds: . allopurinol  100 mg Oral Daily  . apixaban  5 mg Oral BID  . Chlorhexidine Gluconate Cloth  6 each Topical Q0600  . gabapentin  300 mg Oral BID  . insulin aspart  0-5 Units Subcutaneous QHS  . insulin aspart  0-9 Units Subcutaneous TID WC  . insulin glargine  10 Units Subcutaneous  Daily  . magnesium oxide  400 mg Oral Daily  . metoprolol tartrate  12.5 mg Oral BID   Continuous Infusions: . lactated ringers 75 mL/hr at 09/04/18 0157     LOS: 3 days    Time spent: 25 minutes     Jode Lippe J [place] DO [facility] Pager [phone]  If 7PM-7AM, please contact night-coverage [email or web address] Password [facility] 09/04/2018, 10:19 AM

## 2018-09-04 NOTE — Care Management Important Message (Signed)
Important Message  Patient Details  Name: Teresa Peck MRN: [id number] Date of Birth: 27-May-1940   Medicare Important Message Given:  Yes     Memory Argue 09/04/2018, 4:33 PM

## 2018-09-04 NOTE — TOC Initial Note (Addendum)
Transition of Care [facility]) - Initial/Assessment Note    Patient Details  Name: Teresa Peck MRN: [id number] Date of Birth: February 26, 1940  [facility]) CM/SW Contact:    Sharin Mons, RN Phone Number: 09/04/2018, 8:16 PM  Clinical Narrative:       Admitted with DKA/rhabdomyolysis. Pt found down on the floor for 2-3 days, s/p fall. From home alone. NCM spoke with Ben(son), NOK in regard to discharge planning. NCM shared PT's evaluation/ recommendations: SNF placement.Teresa Peck stated he with would like for mom to come home @ d/c and declined SNF placement. Teresa Peck states he plans to stay with mom once she discharges and utilize family to ensure mom has 24/7 supervision/ assistance. Agreeable to home health services. States pt has used [facility] in the past and would like to use them to provide the home health services. Pt's home address: [address] , [place] , [place], [address].   Teresa Peck (Son)     [phone] (h) (c) [phone]     NCM will continue to monitor for TOC needs ....                Expected Discharge Plan: Skilled Nursing Facility Barriers to Discharge: Continued Medical Work up   Patient Goals and CMS Choice Patient states their goals for this hospitalization and ongoing recovery are:: to get home and get back to normal CMS Medicare.gov Compare Post Acute Care list provided to:: Patient Represenative (must comment)(Pt confused , NCM spoke with son Teresa Peck) Choice offered to / list presented to : Adult Children(Pt confused , NCM spoke with son Teresa Peck)  Expected Discharge Plan and Services Expected Discharge Plan: [place]   Discharge Planning Services: CM Consult   Living arrangements for the past 2 months: Single Family Home                 DME Arranged: [facility] rolling(Per son Teresa Peck pt will need a rolling walker @d /c , already has BSC) DME Agency: [place] Agency: [place] ([facility])(Choice offered to son Teresa Peck, if  home health services are needed @ d/c [facility] was selected)        Prior Living Arrangements/Services Living arrangements for the past 2 months: Single Family Home Lives with:: Self Patient language and need for interpreter reviewed:: Yes Do you feel safe going back to the place where you live?: Yes      Need for Family Participation in Patient Care: Yes (Comment) Care giver support system in place?: Yes (comment)   Criminal Activity/Legal Involvement Pertinent to Current Situation/Hospitalization: No - Comment as needed  Activities of Daily Living      Permission Sought/Granted                  Emotional Assessment Appearance:: Appears stated age Attitude/Demeanor/Rapport: Unable to Assess Affect (typically observed): Unable to Assess Orientation: : Oriented to Self, Oriented to Place Alcohol / Substance Use: Not Applicable Psych Involvement: No (comment)  Admission diagnosis:  ARF (acute renal failure) ([place]) [N17.9] Abnormal liver function [R94.5] Atrial fibrillation with RVR ([place]) [I48.91] Fall, initial encounter [W19.XXXA] Diabetic ketoacidosis without coma associated with type 2 diabetes mellitus ([place]) [E11.10] Patient Active Problem List   Diagnosis Date Noted  . DKA (diabetic ketoacidoses) ([place]) 09/01/2018  . ARF (acute renal failure) ([place]) 09/01/2018  . Atrial fibrillation with RVR ([place]) 09/01/2018  . Hyperkalemia 09/01/2018  . Abnormal liver function 09/01/2018  . Chronic atrial fibrillation 10/30/2016  .  Lumbar stenosis with neurogenic claudication 04/13/2015  . Chronic systolic CHF (congestive heart failure) ([place]) 10/19/2014  . DIAB W/O COMP TYPE II/UNS NOT STATED UNCNTRL 11/08/2009  . Hyperlipidemia 11/08/2009  . CAD (coronary artery disease) 11/08/2009  . CARDIOMYOPATHY, ISCHEMIC 11/08/2009   PCP:  Deland Pretty, MD Pharmacy:   [facility] [place] [place] - [address] [place]  [address] Phone: [phone] Fax: [phone]  [facility] [place], [place] [address] Phone: [phone] Fax: [phone]     Social Determinants of Health (SDOH) Interventions    Readmission Risk Interventions No flowsheet data found.

## 2018-09-04 NOTE — Discharge Instructions (Signed)

## 2018-09-04 NOTE — Progress Notes (Signed)
Patient's son Suezanne Jacquet updated about the plan of care via phone. Patient talked with her son over the phone.

## 2018-09-05 ENCOUNTER — Other Ambulatory Visit: Payer: Self-pay

## 2018-09-05 ENCOUNTER — Encounter (HOSPITAL_COMMUNITY): Payer: Self-pay | Admitting: General Practice

## 2018-09-05 DIAGNOSIS — E1111 Type 2 diabetes mellitus with ketoacidosis with coma: Secondary | ICD-10-CM

## 2018-09-05 LAB — COMPREHENSIVE METABOLIC PANEL
ALT: 161 U/L — ABNORMAL HIGH (ref 0–44)
AST: 99 U/L — ABNORMAL HIGH (ref 15–41)
Albumin: 1.9 g/dL — ABNORMAL LOW (ref 3.5–5.0)
Alkaline Phosphatase: 218 U/L — ABNORMAL HIGH (ref 38–126)
Anion gap: 10 (ref 5–15)
BUN: 28 mg/dL — ABNORMAL HIGH (ref 8–23)
CO2: 25 mmol/L (ref 22–32)
Calcium: 8.5 mg/dL — ABNORMAL LOW (ref 8.9–10.3)
Chloride: 98 mmol/L (ref 98–111)
Creatinine, Ser: 0.98 mg/dL (ref 0.44–1.00)
GFR calc Af Amer: >60 mL/min (ref >60)
GFR calc non Af Amer: 54 mL/min — ABNORMAL LOW (ref >60)
Glucose, Bld: 142 mg/dL — ABNORMAL HIGH (ref 70–99)
Potassium: 3.6 mmol/L (ref 3.5–5.1)
Sodium: 133 mmol/L — ABNORMAL LOW (ref 135–145)
Total Bilirubin: 1.6 mg/dL — ABNORMAL HIGH (ref 0.3–1.2)
Total Protein: 4.9 g/dL — ABNORMAL LOW (ref 6.5–8.1)

## 2018-09-05 LAB — GLUCOSE, CAPILLARY
Glucose-Capillary: 124 mg/dL — ABNORMAL HIGH (ref 70–99)
Glucose-Capillary: 213 mg/dL — ABNORMAL HIGH (ref 70–99)

## 2018-09-05 MED ORDER — INSULIN GLARGINE 100 UNITS/ML SOLOSTAR PEN: 10.0000 [IU] | PEN_INJECTOR | Freq: Every day | SUBCUTANEOUS | 11 refills | Status: DC

## 2018-09-05 NOTE — Discharge Summary (Signed)
Physician Discharge Summary  Teresa Peck EXN:[id number] DOB: 1940-03-02 DOA: 08/31/2018  PCP: Teresa Pretty, MD  Admit date: 08/31/2018 Discharge date: 09/05/2018  Admitted From: Home Disposition:  Home  Recommendations for Outpatient Follow-up:  1. Follow up with PCP in 1 week 2. Follow-up with Cardiology in 2-3 weeks with Dr. Acie Fredrickson regarding worsening of CHF with new EF 30-35%. 3. Please obtain BMP in one week  Home Health: Yes, PT, OT, RN Equipment/Devices: walker  Discharge Condition: stable CODE STATUS: Full Code Diet recommendation: Heart Healthy / Carb Modified   History of present illness:  [name] with past medical history remarkable for persistent atrial fibrillation, CAD with occluded LAD status post PCI/stent to first diagonal, chronic systolic congestive heart failure, essential hypertension, type 2 diabetes mellitus and osteoarthritis who was apparently found down on the floor by family secondary to mechanical fall.  She was apparently found in the bathroom and may have been there for up to 2-3 days.  Upon EMS arrival, patient was coherent and answers questions appropriately.  She had been constipated recently and taking stool softeners with multiple trips to the bathroom that evening and she apparently slipped and fell in her bathroom where she was found.  In the ED, patient was noted to have WBC count of 16.5, hemoglobin 16.8, platelets 151, sodium 135, potassium 5.3, BUN 67, creatinine 1.73, glucose 433, anion gap 17, bicarb 18, AST 121, ALT 109, alk phos 65.  Patient was given 1 L normal saline bolus.  Patient was referred for admission by EDP for DKA, mild rhabdomyolysis, and A. fib with RVR.  Hospital course:  Acute metabolic encephalopathy: resolved Hypotension/Shock secondary to medication side effect with narcotics: resolved Patient with worsening hypotension on 7/14 SBP in the mid 60s with MAP of 57 w/ decreased respiratory  drive.  No infectious etiology identified; CXR negative, UA negative, no fever or leukocytosis.  Patient has had a few doses of oral narcotics, and did receive a dose of Narcan with mild improvement.  Etiology likely medication side effect.  Patient received a total of 2 doses of Narcan with improvement of her blood pressure.  She did not require vasopressors and her blood pressure normalized.  Diabetic ketoacidosis High anion gap metabolic acidosis Hx [id number] Patient presenting after being found down in her bathroom for likely 2-3 days, likely not taking her home medications during this timeframe.  Patient on metformin 500 mg p.o. twice daily and Lantus 8 units [name] at home.  Glucose elevated to 433 on admission with an anion gap of 17 and a CO2 of 18.  Urinalysis with 20 ketones.  Chest x-ray clear, UA unrevealing; unlikely infectious etiology.  Hemoglobin A1c 8.1.  Patient was titrated off insulin drip and started on Lantus 10 units subcutaneously daily.  With this dose, her blood glucose remained very well controlled without any need for further coverage.  Patient will discharge home on a new Lantus prescription of 10 units subcutaneously daily.  Continue carbohydrate modified diet.  Recommend continue monitoring daily a.m. glucose and maintain a log to bring to next PCP visit.  Persistent atrial fibrillation with RVR Follows with cardiology outpatient, Dr. Acie Fredrickson.  On metoprolol succinate 25 mg p.o. daily and Eliquis 5 mg p.o. twice daily outpatient.  Etiology of her elevated heart rate likely secondary to not taking her medications for the past few days as above.  Patient was initially started on a Cardizem drip, which was titrated off.  Resume home metoprolol succinate 25  mg p.o. daily and Eliquis.  Mild rhabdomyolysis Mechanical fall CK elevated at 1562.  Patient reports slipped in her bathroom and was unable to get up.  Daughter-in-law reports that she was likely down for 2-3 days.  Etiology  likely prolonged downtime.  Patient was given gentle IV fluid hydration with improvement of her symptoms.  PT/OT recommended SNF placement, patient's son declined given current pandemic with the SARS-CoV-2 virus and desires her mother to return home with home health services.  Patient will discharge with rolling walker, PT, OT, and RN.  Acute renal failure Obstructive uropathy Creatinine noted to be elevated to 1.73 at time of admission.  Etiology likely secondary to prerenal azotemia versus ATN from demand ischemia secondary to A. fib with RVR as above.  It was 1.31 on 02/26/2018.  Renal ultrasound with symmetric renal atrophy, no hydronephrosis or other reversible finding.  Patient initially had a Foley catheter placed for urinary retention.  Once her mentation improved; a voiding trial was accomplished with success and her Foley catheter was removed on 09/04/2018.  Creatinine at time of discharge was 0.98.  Recommend repeat BMP in 1 week.  Elevated troponin Troponin 56, 66.  Etiology likely demand ischemia from underlying A. fib with RVR and a mild rhabdomyolysis.  Patient denied any chest discomfort during her hospitalization.  A. fib with RVR resolved as above.  Transaminitis 2/2 Shock Liver Patient with elevated AST/ALT on admission.  Etiology likely from mild shock liver from underlying A. fib with RVR and prolonged downtime.  Now worsening, likely secondary to hypotension.  AST and ALT trended up to a high of 1898 and 731 respectively.  Right upper quadrant ultrasound notable for fatty infiltration of the liver.  Acute hepatitis panel was negative.  Transaminitis has resolved with IV fluid hydration, supportive care and resolution of her hypotension.  Essential hypertension Chronic systolic congestive heart failure; appears compensated CAD Patient follows with cardiology outpatient, Dr. Acie Fredrickson.  Patient with history of LAD occlusion with stenting to first diagonal.  Blood pressure well  controlled 120/59; although patient noted to be in A. fib with RVR as above secondary to being without her medications over the past 2-3 days.  Last TTE October 2017 with EF 50-55%. TTE 7/13 with EF 30-35%, mid to apical anterior septal/anterior wall akinetic, normal RV systolic function, LA mildly dilated, trivial pericardial effusion, trivial MR. resume home Toprol succinate and statin on discharge.  Recommend following up with cardiology in 2-3 weeks for further evaluation and recommendations.  May need repeat echocardiogram to eval weight whether EF improves following resolution of her A. fib with RVR.   Discharge Diagnoses:  Active Problems:   CAD (coronary artery disease)   Chronic systolic CHF (congestive heart failure) (HCC)   Chronic atrial fibrillation    Discharge Instructions  Discharge Instructions    (HEART FAILURE PATIENTS) Call MD:  Anytime you have any of the following symptoms: 1) 3 pound weight gain in 24 hours or 5 pounds in 1 week 2) shortness of breath, with or without a dry hacking cough 3) swelling in the hands, feet or stomach 4) if you have to sleep on extra pillows at night in order to breathe.   Complete by: As directed    Call MD for:  difficulty breathing, headache or visual disturbances   Complete by: As directed    Call MD for:  extreme fatigue   Complete by: As directed    Call MD for:  hives   Complete by:  As directed    Call MD for:  persistant dizziness or light-headedness   Complete by: As directed    Call MD for:  persistant nausea and vomiting   Complete by: As directed    Call MD for:  severe uncontrolled pain   Complete by: As directed    Call MD for:  temperature >100.4   Complete by: As directed    Diet - low sodium heart healthy   Complete by: As directed    Increase activity slowly   Complete by: As directed      Allergies as of 09/05/2018      Reactions   Sulfur Rash   Rash/flushed, blood red eyes      Medication List    TAKE  these medications   allopurinol 300 MG tablet Commonly known as: ZYLOPRIM Take 300 mg by mouth daily.   apixaban 5 MG Tabs tablet Commonly known as: ELIQUIS Take 1 tablet (5 mg total) by mouth 2 (two) times daily.   furosemide 40 MG tablet Commonly known as: LASIX Take 40 mg by mouth every other day.   gabapentin 600 MG tablet Commonly known as: NEURONTIN Take 600 mg by mouth 2 (two) times daily.   insulin glargine 100 unit/mL Sopn Commonly known as: LANTUS Inject 0.1 mLs (10 Units total) into the skin daily. What changed:   how much to take  when to take this   Magnesium Oxide 400 (240 Mg) MG Tabs Take 1 tablet (400 mg total) by mouth daily.   metFORMIN 500 MG tablet Commonly known as: GLUCOPHAGE Take 500 mg by mouth 2 (two) times daily with a meal.   metoprolol succinate 25 MG 24 hr tablet Commonly known as: TOPROL-XL Take 25 mg by mouth daily.   nitroGLYCERIN 0.4 MG SL tablet Commonly known as: NITROSTAT PLACE 1 TABLET UNDER THE TONGUE EVERY 5 MINUTES AS NEEDED FOR CHEST PAIN What changed: See the new instructions.   Restasis 0.05 % ophthalmic emulsion Generic drug: cycloSPORINE Place 1 drop into both eyes 2 (two) times daily.   rosuvastatin 10 MG tablet Commonly known as: CRESTOR Take 10 mg by mouth daily.   ZANTAC PO Take 1 tablet by mouth daily.            Durable Medical Equipment  (From admission, onward)         Start     Ordered   09/05/18 0709  For home use only DME Walker rolling  Once    Question:  Patient needs a walker to treat with the following condition  Answer:  Gait disturbance   09/05/18 0710         Follow-up Information    Teresa Pretty, MD. Schedule an appointment as soon as possible for a visit in 1 week(s).   Specialty: Internal Medicine Contact information: [address] [place] [address] [phone]        Nahser, Wonda Cheng, MD. Schedule an appointment as soon as possible for a visit in 1  week(s).   Specialty: Cardiology Contact information: [address] [place] [address] [phone]          Allergies  Allergen Reactions  . Sulfur Rash    Rash/flushed, blood red eyes    Consultations:  PCCM   Procedures/Studies: Dg Wrist Complete Right  Result Date: 09/01/2018 CLINICAL DATA:  78 y/o  F; right wrist pain after fall. EXAM: RIGHT WRIST - COMPLETE 3+ VIEW COMPARISON:  None. FINDINGS: There  is no evidence of fracture or dislocation. There is no evidence of arthropathy or other focal bone abnormality. Soft tissues are unremarkable. IMPRESSION: No acute fracture or dislocation identified. Electronically Signed   By: Kristine Garbe M.D.   On: 09/01/2018 00:57   Ct Head Wo Contrast  Result Date: 09/01/2018 CLINICAL DATA:  Today. Large forehead swelling. EXAM: CT HEAD WITHOUT CONTRAST CT CERVICAL SPINE WITHOUT CONTRAST TECHNIQUE: Multidetector CT imaging of the head and cervical spine was performed following the standard protocol without intravenous contrast. Multiplanar CT image reconstructions of the cervical spine were also generated. COMPARISON:  Head CT without contrast 06/18/2011 FINDINGS: CT HEAD FINDINGS Brain: Cerebral volume is within normal limits for age. There is chronic subcortical white matter hypodensity at the anterior left operculum which is stable (series 4, image 22). Mild chronic overlying cortical encephalomalacia. Otherwise normal Gray-white matter differentiation throughout the brain. No midline shift, ventriculomegaly, mass effect, evidence of mass lesion, intracranial hemorrhage or evidence of cortically based acute infarction. Vascular: Mild for age Calcified atherosclerosis at the skull base. No suspicious intracranial vascular hyperdensity. Skull: Stable and intact. Sinuses/Orbits: Mild frontoethmoidal fluid and bubbly opacity. Mild maxillary mucosal thickening. Other paranasal sinuses are well pneumatized. There is trace  bilateral mastoid fluid. Other: Broad-based right side scalp hematoma up to 8 millimeters in thickness. Hematoma tracks anteriorly to the right forehead. New left forehead soft tissue scarring since 2013. Orbits soft tissues appears stable and negative. Mild right premalar soft tissue stranding. CT CERVICAL SPINE FINDINGS Alignment: Straightening of cervical lordosis. Cervicothoracic junction alignment is within normal limits. Bilateral posterior element alignment is within normal limits. Skull base and vertebrae: Visualized skull base is intact. No atlanto-occipital dissociation. Ankylosis of the left C2-C3 posterior elements. Interbody ankylosis at C5-C6, appears acquired rather than congenital. Developing ankylosis at C7-T1 including the left posterior element. No acute osseous abnormality identified. Soft tissues and spinal canal: No prevertebral fluid or swelling. No visible canal hematoma. Asymmetric soft tissue stranding in the right neck and supraclavicular fossa (series 9, image 57). No discrete hematoma. Partially retropharyngeal course of the carotids, normal variant. Disc levels: Widespread cervical spine degeneration. Mild spinal stenosis at least at C3-C4 and C6-C7. Upper chest: Visible upper thoracic levels appear intact. The right neck soft tissue swelling continues to the anterior right chest wall. IMPRESSION: 1. Right scalp hematoma without skull fracture. 2. Stable since 2013 non contrast CT appearance of the brain. Minimal encephalomalacia at the anterior left operculum. 3. Probably posttraumatic soft tissue stranding/contusion in the right neck, but no discrete neck hematoma. 4. No acute traumatic injury identified in the cervical spine. 5. 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 Electronically Signed   By: Genevie Ann M.D.   On: 09/01/2018 00:03   Ct Cervical Spine Wo Contrast  Result Date: 09/01/2018 CLINICAL DATA:  Today. Large forehead swelling. EXAM: CT HEAD WITHOUT  CONTRAST CT CERVICAL SPINE WITHOUT CONTRAST TECHNIQUE: Multidetector CT imaging of the head and cervical spine was performed following the standard protocol without intravenous contrast. Multiplanar CT image reconstructions of the cervical spine were also generated. COMPARISON:  Head CT without contrast 06/18/2011 FINDINGS: CT HEAD FINDINGS Brain: Cerebral volume is within normal limits for age. There is chronic subcortical white matter hypodensity at the anterior left operculum which is stable (series 4, image 22). Mild chronic overlying cortical encephalomalacia. Otherwise normal Gray-white matter differentiation throughout the brain. No midline shift, ventriculomegaly, mass effect, evidence of mass lesion, intracranial hemorrhage or evidence of cortically based acute infarction. Vascular:  Mild for age Calcified atherosclerosis at the skull base. No suspicious intracranial vascular hyperdensity. Skull: Stable and intact. Sinuses/Orbits: Mild frontoethmoidal fluid and bubbly opacity. Mild maxillary mucosal thickening. Other paranasal sinuses are well pneumatized. There is trace bilateral mastoid fluid. Other: Broad-based right side scalp hematoma up to 8 millimeters in thickness. Hematoma tracks anteriorly to the right forehead. New left forehead soft tissue scarring since 2013. Orbits soft tissues appears stable and negative. Mild right premalar soft tissue stranding. CT CERVICAL SPINE FINDINGS Alignment: Straightening of cervical lordosis. Cervicothoracic junction alignment is within normal limits. Bilateral posterior element alignment is within normal limits. Skull base and vertebrae: Visualized skull base is intact. No atlanto-occipital dissociation. Ankylosis of the left C2-C3 posterior elements. Interbody ankylosis at C5-C6, appears acquired rather than congenital. Developing ankylosis at C7-T1 including the left posterior element. No acute osseous abnormality identified. Soft tissues and spinal canal: No  prevertebral fluid or swelling. No visible canal hematoma. Asymmetric soft tissue stranding in the right neck and supraclavicular fossa (series 9, image 57). No discrete hematoma. Partially retropharyngeal course of the carotids, normal variant. Disc levels: Widespread cervical spine degeneration. Mild spinal stenosis at least at C3-C4 and C6-C7. Upper chest: Visible upper thoracic levels appear intact. The right neck soft tissue swelling continues to the anterior right chest wall. IMPRESSION: 1. Right scalp hematoma without skull fracture. 2. Stable since 2013 non contrast CT appearance of the brain. Minimal encephalomalacia at the anterior left operculum. 3. Probably posttraumatic soft tissue stranding/contusion in the right neck, but no discrete neck hematoma. 4. No acute traumatic injury identified in the cervical spine. 5. 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 Electronically Signed   By: Genevie Ann M.D.   On: 09/01/2018 00:03   US Renal  Result Date: 09/01/2018 CLINICAL DATA:  Acute renal failure EXAM: RENAL / URINARY TRACT ULTRASOUND COMPLETE COMPARISON:  None. FINDINGS: Right Kidney: Renal measurements: 9.3 x 3.8 x 4.2 cm = volume: 80 mL. Cortical thinning and mild lobulation. Negative for mass or hydronephrosis. Left Kidney: Renal measurements: 8.5 x 4.8 x 4.1 cm = volume: 86 mL. Cortical thinning and mild lobulation. Negative for mass or hydronephrosis. Bladder: Appears normal for degree of bladder distention. IMPRESSION: 1. Symmetric renal atrophy. 2. No hydronephrosis or other reversible finding. Electronically Signed   By: Monte Fantasia M.D.   On: 09/01/2018 08:27   Dg Pelvis Portable  Result Date: 08/31/2018 CLINICAL DATA:  Fall 2 days ago with pelvic pain, initial encounter EXAM: PORTABLE PELVIS 1-2 VIEWS COMPARISON:  None. FINDINGS: Degenerative changes of the lumbar spine are seen. Degenerative changes of the hip joints are noted bilaterally. No soft tissue  abnormality is seen. No fractures are seen. IMPRESSION: No acute abnormality noted. Electronically Signed   By: Inez Catalina M.D.   On: 08/31/2018 23:57   Dg Chest Port 1 View  Result Date: 09/03/2018 CLINICAL DATA:  Respiratory failure EXAM: PORTABLE CHEST 1 VIEW COMPARISON:  08/31/2018 FINDINGS: Cardiac shadow is stable. Aortic calcifications are seen. Mild bibasilar atelectasis is noted. No focal confluent infiltrate is seen. No bony abnormality is noted. IMPRESSION: Mild bibasilar atelectasis. Electronically Signed   By: Inez Catalina M.D.   On: 09/03/2018 07:17   Dg Chest Port 1 View  Result Date: 08/31/2018 CLINICAL DATA:  Recent fall with chest pain, initial encounter EXAM: PORTABLE CHEST 1 VIEW COMPARISON:  None. FINDINGS: Cardiac shadow is at the upper limits of normal in size. Aortic calcifications are seen. The lungs are well aerated without focal  infiltrate or sizable effusion. No bony abnormality is seen. IMPRESSION: No acute abnormality noted. Electronically Signed   By: Inez Catalina M.D.   On: 08/31/2018 23:58   Dg Knee Right Port  Result Date: 08/31/2018 CLINICAL DATA:  Recent fall with right knee pain, initial encounter EXAM: PORTABLE RIGHT KNEE - 2 VIEW COMPARISON:  None. FINDINGS: Degenerative changes of the right knee joint are seen. No sizable effusion is noted. No acute fracture or dislocation is seen. IMPRESSION: Degenerative change without acute abnormality. Electronically Signed   By: Inez Catalina M.D.   On: 08/31/2018 23:57   US Abdomen Limited Ruq  Result Date: 09/01/2018 CLINICAL DATA:  Abnormal liver function tests EXAM: ULTRASOUND ABDOMEN LIMITED RIGHT UPPER QUADRANT COMPARISON:  None. FINDINGS: Gallbladder: History of cholecystectomy. Shadowing gas is seen in the gallbladder fossa. Common bile duct: Diameter: 5 mm.  Where visualized, no filling defect. Liver: Borderline starry sky appearance which is likely technical in this case. No evidence of mass lesion. Portal vein  is patent on color Doppler imaging with normal direction of blood flow towards the liver. IMPRESSION: Negative right upper quadrant ultrasound after cholecystectomy. Electronically Signed   By: Monte Fantasia M.D.   On: 09/01/2018 04:15     Transthoracic echocardiogram: 7/13 IMPRESSIONS   1. The left ventricle has moderate-severely reduced systolic function, with an ejection fraction of 30-35%. The cavity size was normal. Left ventricular diastolic Doppler parameters are indeterminate. Technically difficult study with poor windows to  assess wall motion. Definity may have helped but was not used. The mid to apical anteroseptal and anterior walls as well as the apical inferior wall and the true apex appeared akinetic. 2. The right ventricle has normal systolic function. The cavity was normal. There is no increase in right ventricular wall thickness. 3. Left atrial size was mildly dilated. 4. Trivial pericardial effusion is present. 5. The mitral valve is degenerative. Moderate calcification of the mitral valve leaflet. There is moderate mitral annular calcification present. No evidence of mitral valve stenosis. Trivial mitral regurgitation. 6. The aortic valve is tricuspid. Moderate calcification of the aortic valve. No stenosis of the aortic valve. 7. The ascending aorta and aortic root are normal in size and structure. 8. Normal IVC size. PA systolic pressure 27 mmHg.   Subjective: Seen and examined at bedside, resting comfortably.  Continues with mild muscle aches.  No other complaints at this time.  Discussed plan of care with patient's son, Suezanne Jacquet via telephone this morning on plans to return home with him rather than go to SNF given current pandemic.  No other concerns at this time.  Patient denies headache, no fever/chills/night sweats, no chest pain, no palpitations, no shortness of breath, no abdominal pain.  No acute events overnight per nurse staff.   Discharge Exam: Vitals:    09/05/18 0437 09/05/18 0927  BP: 129/71 121/64  Pulse: 65 (!) 51  Resp: 18   Temp: 98.5 F (36.9 C)   SpO2: 99% 95%   Vitals:   09/04/18 1959 09/05/18 0026 09/05/18 0437 09/05/18 0927  BP: 108/64 128/82 129/71 121/64  Pulse: 66 80 65 (!) 51  Resp: 18 18 18    Temp: 99.5 F (37.5 C) 98.1 F (36.7 C) 98.5 F (36.9 C)   TempSrc: Oral  Oral   SpO2: 96% 97% 99% 95%  Weight:   80.9 kg   Height:        General exam: No acute distress, alert and oriented x4 HEENT: Ecchymosis noted to right  face and right neck, pupils 3 mm bilaterally, equal round reactive to light Respiratory system: Clear to auscultation. Respiratory effort normal with slightly decreased drive, on room air Cardiovascular system: Irregularly irregular rhythm, normal rate. No JVD, murmurs, rubs, gallops or clicks. No pedal edema. Gastrointestinal system: Abdomen is nondistended, soft and nontender. No organomegaly or masses felt. Normal bowel sounds heard. Central nervous system: Somnolent, opens eyes briefly to command Extremities: Moving all extremities independently Skin: Ecchymosis noted to neck/face Psychiatry: Depressed mood, flat affect   The results of significant diagnostics from this hospitalization (including imaging, microbiology, ancillary and laboratory) are listed below for reference.     Microbiology: Recent Results (from the past 240 hour(s))  SARS Coronavirus 2 (CEPHEID - Performed in [place] hospital lab), Hosp Order     Status: None   Collection Time: 09/01/18  2:00 AM   Specimen: Nasopharyngeal Swab  Result Value Ref Range Status   SARS Coronavirus 2 NEGATIVE NEGATIVE Final    Comment: (NOTE) If result is NEGATIVE SARS-CoV-2 target nucleic acids are NOT DETECTED. The SARS-CoV-2 RNA is generally detectable in upper and lower  respiratory specimens during the acute phase of infection. The lowest  concentration of SARS-CoV-2 viral copies this assay can detect is 250  copies / mL. A  negative result does not preclude SARS-CoV-2 infection  and should not be used as the sole basis for treatment or other  patient management decisions.  A negative result may occur with  improper specimen collection / handling, submission of specimen other  than nasopharyngeal swab, presence of viral mutation(s) within the  areas targeted by this assay, and inadequate number of viral copies  (<250 copies / mL). A negative result must be combined with clinical  observations, patient history, and epidemiological information. If result is POSITIVE SARS-CoV-2 target nucleic acids are DETECTED. The SARS-CoV-2 RNA is generally detectable in upper and lower  respiratory specimens dur ing the acute phase of infection.  Positive  results are indicative of active infection with SARS-CoV-2.  Clinical  correlation with patient history and other diagnostic information is  necessary to determine patient infection status.  Positive results do  not rule out bacterial infection or co-infection with other viruses. If result is PRESUMPTIVE POSTIVE SARS-CoV-2 nucleic acids MAY BE PRESENT.   A presumptive positive result was obtained on the submitted specimen  and confirmed on repeat testing.  While 2019 novel coronavirus  (SARS-CoV-2) nucleic acids may be present in the submitted sample  additional confirmatory testing may be necessary for epidemiological  and / or clinical management purposes  to differentiate between  SARS-CoV-2 and other Sarbecovirus currently known to infect humans.  If clinically indicated additional testing with an alternate test  methodology [phone]) is advised. The SARS-CoV-2 RNA is generally  detectable in upper and lower respiratory sp ecimens during the acute  phase of infection. The expected result is Negative. Fact Sheet for Patients:  StrictlyIdeas.no Fact Sheet for Healthcare Providers: BankingDealers.co.za This test is not  yet approved or cleared by the [place] FDA and has been authorized for detection and/or diagnosis of SARS-CoV-2 by FDA under an Emergency Use Authorization (EUA).  This EUA will remain in effect (meaning this test can be used) for the duration of the COVID-19 declaration under Section 564(b)(1) of the Act, 21 U.S.C. section 360bbb-3(b)(1), unless the authorization is terminated or revoked sooner. Performed at [facility], [place] [address] [place], [place] [address]   MRSA PCR Screening  Status: None   Collection Time: 09/02/18 11:28 AM   Specimen: Nasal Mucosa; Nasopharyngeal  Result Value Ref Range Status   MRSA by PCR NEGATIVE NEGATIVE Final    Comment:        The GeneXpert MRSA Assay (FDA approved for NASAL specimens only), is one component of a comprehensive MRSA colonization surveillance program. It is not intended to diagnose MRSA infection nor to guide or monitor treatment for MRSA infections. Performed at [facility], [place] [address], [place], [place] [address]      Labs: BNP (last 3 results) No results for input(s): BNP in the last 8760 hours. Basic Metabolic Panel: Recent Labs  Lab 09/02/18 0648 09/02/18 1431 09/03/18 0516 09/04/18 0317 09/05/18 0228  NA 137 138 139 134* 133*  K 3.9 4.4 4.3 3.9 3.6  CL 103 108 108 103 98  CO2 21* 22 19* 19* 25  GLUCOSE 226* 199* 120* 153* 142*  BUN 70* 73* 62* 41* 28*  CREATININE 1.69* 1.82* 1.51* 1.24* 0.98  CALCIUM 8.9 8.6* 8.5* 8.5* 8.5*  MG 2.2  --  2.1  --   --   PHOS  --   --  2.9  --   --    Liver Function Tests: Recent Labs  Lab 09/02/18 0648 09/02/18 1431 09/03/18 0516 09/04/18 0317 09/05/18 0228  AST 1,898* 1,134* 474* 160* 99*  ALT 731* 584* 381* 220* 161*  ALKPHOS 168* 154* 145* 167* 218*  BILITOT 3.6* 3.6* 3.7* 3.1* 1.6*  PROT 5.9* 5.3* 5.4* 4.8* 4.9*  ALBUMIN 2.7* 2.3* 2.1* 1.9* 1.9*   No results for input(s): LIPASE, AMYLASE in the last 168 hours. No results for input(s):  AMMONIA in the last 168 hours. CBC: Recent Labs  Lab 08/31/18 2318 09/01/18 0345 09/02/18 0648 09/02/18 1431 09/03/18 0516 09/04/18 0826  WBC 16.5* 15.5* 7.0 13.2* 10.8* 8.3  NEUTROABS 14.8*  --   --   --   --   --   HGB 16.8* 15.2* 14.7 13.1 12.9 13.2  HCT 49.0* 45.4 44.1 39.5 39.8 39.2  MCV 93.7 96.2 96.7 98.0 99.5 94.7  PLT 151 181 167 159 135* 135*   Cardiac Enzymes: Recent Labs  Lab 08/31/18 2318 09/01/18 0310 09/02/18 0648 09/03/18 0516  CKTOTAL QUANTITY NOT SUFFICIENT, UNABLE TO PERFORM TEST 1,562* 824* 350*  CKMB  --   --  13.5*  --    BNP: Invalid input(s): POCBNP CBG: Recent Labs  Lab 09/03/18 2227 09/04/18 0753 09/04/18 1205 09/04/18 1642 09/05/18 0729  GLUCAP 158* 138* 174* 236* 124*   D-Dimer No results for input(s): DDIMER in the last 72 hours. Hgb A1c Recent Labs    09/03/18 0516  HGBA1C 8.2*   Lipid Profile No results for input(s): CHOL, HDL, LDLCALC, TRIG, CHOLHDL, LDLDIRECT in the last 72 hours. Thyroid function studies No results for input(s): TSH, T4TOTAL, T3FREE, THYROIDAB in the last 72 hours.  Invalid input(s): FREET3 Anemia work up No results for input(s): VITAMINB12, FOLATE, FERRITIN, TIBC, IRON, RETICCTPCT in the last 72 hours. Urinalysis    Component Value Date/Time   COLORURINE YELLOW 09/01/2018 0222   APPEARANCEUR CLOUDY (A) 09/01/2018 0222   LABSPEC 1.020 09/01/2018 0222   PHURINE 5.0 09/01/2018 0222   GLUCOSEU >=500 (A) 09/01/2018 0222   HGBUR NEGATIVE 09/01/2018 0222   BILIRUBINUR NEGATIVE 09/01/2018 0222   KETONESUR 20 (A) 09/01/2018 0222   PROTEINUR 30 (A) 09/01/2018 0222   UROBILINOGEN 0.2 12/29/2011 1540   NITRITE NEGATIVE 09/01/2018 0222  LEUKOCYTESUR NEGATIVE 09/01/2018 0222   Sepsis Labs Invalid input(s): PROCALCITONIN,  WBC,  LACTICIDVEN Microbiology Recent Results (from the past 240 hour(s))  SARS Coronavirus 2 (CEPHEID - Performed in [place] hospital lab), Hosp Order     Status: None    Collection Time: 09/01/18  2:00 AM   Specimen: Nasopharyngeal Swab  Result Value Ref Range Status   SARS Coronavirus 2 NEGATIVE NEGATIVE Final    Comment: (NOTE) If result is NEGATIVE SARS-CoV-2 target nucleic acids are NOT DETECTED. The SARS-CoV-2 RNA is generally detectable in upper and lower  respiratory specimens during the acute phase of infection. The lowest  concentration of SARS-CoV-2 viral copies this assay can detect is 250  copies / mL. A negative result does not preclude SARS-CoV-2 infection  and should not be used as the sole basis for treatment or other  patient management decisions.  A negative result may occur with  improper specimen collection / handling, submission of specimen other  than nasopharyngeal swab, presence of viral mutation(s) within the  areas targeted by this assay, and inadequate number of viral copies  (<250 copies / mL). A negative result must be combined with clinical  observations, patient history, and epidemiological information. If result is POSITIVE SARS-CoV-2 target nucleic acids are DETECTED. The SARS-CoV-2 RNA is generally detectable in upper and lower  respiratory specimens dur ing the acute phase of infection.  Positive  results are indicative of active infection with SARS-CoV-2.  Clinical  correlation with patient history and other diagnostic information is  necessary to determine patient infection status.  Positive results do  not rule out bacterial infection or co-infection with other viruses. If result is PRESUMPTIVE POSTIVE SARS-CoV-2 nucleic acids MAY BE PRESENT.   A presumptive positive result was obtained on the submitted specimen  and confirmed on repeat testing.  While 2019 novel coronavirus  (SARS-CoV-2) nucleic acids may be present in the submitted sample  additional confirmatory testing may be necessary for epidemiological  and / or clinical management purposes  to differentiate between  SARS-CoV-2 and other Sarbecovirus  currently known to infect humans.  If clinically indicated additional testing with an alternate test  methodology [phone]) is advised. The SARS-CoV-2 RNA is generally  detectable in upper and lower respiratory sp ecimens during the acute  phase of infection. The expected result is Negative. Fact Sheet for Patients:  StrictlyIdeas.no Fact Sheet for Healthcare Providers: BankingDealers.co.za This test is not yet approved or cleared by the [place] FDA and has been authorized for detection and/or diagnosis of SARS-CoV-2 by FDA under an Emergency Use Authorization (EUA).  This EUA will remain in effect (meaning this test can be used) for the duration of the COVID-19 declaration under Section 564(b)(1) of the Act, 21 U.S.C. section 360bbb-3(b)(1), unless the authorization is terminated or revoked sooner. Performed at [facility], [place] [address] [place], [place] [address]   MRSA PCR Screening     Status: None   Collection Time: 09/02/18 11:28 AM   Specimen: Nasal Mucosa; Nasopharyngeal  Result Value Ref Range Status   MRSA by PCR NEGATIVE NEGATIVE Final    Comment:        The GeneXpert MRSA Assay (FDA approved for NASAL specimens only), is one component of a comprehensive MRSA colonization surveillance program. It is not intended to diagnose MRSA infection nor to guide or monitor treatment for MRSA infections. Performed at [facility], [place] [address], [place], [place] [address]      Time  coordinating discharge: Over 30 minutes  SIGNED:   Donnamarie Poag [place] DO  [facility] 09/05/2018, 9:46 AM

## 2018-09-05 NOTE — Progress Notes (Signed)
Pt discharged with family. Pt currently a/o, VSS, showing no signs of distress. MD aware of discharge. All belongings sent home with patient. RN to continue to monitor.

## 2018-09-05 NOTE — Progress Notes (Signed)
  Speech Language Pathology Treatment: Dysphagia  Patient Details Name: Teresa Peck MRN: [id number] DOB: 12-10-1940 Today's Date: 09/05/2018 Time: 0935-1000 SLP Time Calculation (min) (ACUTE ONLY): 25 min  Assessment / Plan / Recommendation Clinical Impression  Patient seen to address dysphagia goals and determine readiness to upgrade from Dys 2 puree, thin liquids. Patient was alert and oriented but does appear with some confusion as she stated that bruising on her neck was from "when they had me tied up". Patient able to masticate graham crackers with mildly prolonged mastication and clearance of oral residuals, but did not require any cues or assistance. Of note, her dentition is in poor condition with some missing. Patient endorses some pain when she is brushing her teeth prior to this hospitalization. At this time, patient is safe to upgrade to Dys 3 mechanical soft solids and continue with thin liquids and appears to be at a very low risk of aspiration. Per chart review, patient and family declined SNF and plan is for discharge home (likely today) with son and other family members providing 24 hour supervision. [place] SLP is not warranted at this time.     HPI HPI: 78 year old with PMH including hypertension, hyperlipidemia, Dm2 w neuropathy, CAD , CHF , PAD, Anemia found 7/12 after being down on the floor for 2 to 3 days after mechanical fall.  Noted to have DKA, rhabdomyolysis, A. fib with RVR, elevated LFTs.  Overall she had been improving.  Rapid response called, transferred to ICU on 7/14 for altered mental status, hypotension.       SLP Plan  Continue with current plan of care       Recommendations  Diet recommendations: Dysphagia 3 (mechanical soft);Thin liquid Liquids provided via: Straw;Cup Medication Administration: Whole meds with puree Supervision: Patient able to self feed Compensations: Minimize environmental distractions;Slow rate;Small sips/bites Postural Changes  and/or Swallow Maneuvers: Seated upright 90 degrees                Oral Care Recommendations: Oral care BID Follow up Recommendations: 24 hour supervision/assistance SLP Visit Diagnosis: Dysphagia, oral phase (R13.11) Plan: Continue with current plan of care       GO                Dannial Monarch 09/05/2018, 9:54 AM    Sonia Baller, MA, CCC-SLP Speech Therapy [facility]

## 2018-09-07 ENCOUNTER — Encounter (HOSPITAL_COMMUNITY): Payer: Self-pay

## 2018-09-07 ENCOUNTER — Other Ambulatory Visit: Payer: Self-pay

## 2018-09-07 ENCOUNTER — Inpatient Hospital Stay (HOSPITAL_COMMUNITY)
Admission: EM | Admit: 2018-09-07 | Discharge: 2018-09-12 | DRG: 948 | Disposition: A | Discharge status: Skilled Nursing Facility | Payer: Medicare HMO | Attending: Internal Medicine | Admitting: Internal Medicine

## 2018-09-07 DIAGNOSIS — N3000 Acute cystitis without hematuria: Secondary | ICD-10-CM | POA: Diagnosis not present

## 2018-09-07 DIAGNOSIS — L89301 Pressure ulcer of unspecified buttock, stage 1: Secondary | ICD-10-CM | POA: Diagnosis not present

## 2018-09-07 DIAGNOSIS — Z8249 Family history of ischemic heart disease and other diseases of the circulatory system: Secondary | ICD-10-CM

## 2018-09-07 DIAGNOSIS — R5381 Other malaise: Principal | ICD-10-CM | POA: Diagnosis present

## 2018-09-07 DIAGNOSIS — E1140 Type 2 diabetes mellitus with diabetic neuropathy, unspecified: Secondary | ICD-10-CM | POA: Diagnosis present

## 2018-09-07 DIAGNOSIS — I2510 Atherosclerotic heart disease of native coronary artery without angina pectoris: Secondary | ICD-10-CM | POA: Diagnosis present

## 2018-09-07 DIAGNOSIS — Z9181 History of falling: Secondary | ICD-10-CM

## 2018-09-07 DIAGNOSIS — E785 Hyperlipidemia, unspecified: Secondary | ICD-10-CM | POA: Diagnosis present

## 2018-09-07 DIAGNOSIS — L89321 Pressure ulcer of left buttock, stage 1: Secondary | ICD-10-CM | POA: Diagnosis not present

## 2018-09-07 DIAGNOSIS — Z955 Presence of coronary angioplasty implant and graft: Secondary | ICD-10-CM

## 2018-09-07 DIAGNOSIS — R52 Pain, unspecified: Secondary | ICD-10-CM | POA: Diagnosis not present

## 2018-09-07 DIAGNOSIS — R531 Weakness: Secondary | ICD-10-CM

## 2018-09-07 DIAGNOSIS — L89890 Pressure ulcer of other site, unstageable: Secondary | ICD-10-CM | POA: Diagnosis present

## 2018-09-07 DIAGNOSIS — E119 Type 2 diabetes mellitus without complications: Secondary | ICD-10-CM | POA: Diagnosis not present

## 2018-09-07 DIAGNOSIS — D638 Anemia in other chronic diseases classified elsewhere: Secondary | ICD-10-CM | POA: Diagnosis present

## 2018-09-07 DIAGNOSIS — L8990 Pressure ulcer of unspecified site, unspecified stage: Secondary | ICD-10-CM | POA: Diagnosis present

## 2018-09-07 DIAGNOSIS — B9689 Other specified bacterial agents as the cause of diseases classified elsewhere: Secondary | ICD-10-CM | POA: Diagnosis present

## 2018-09-07 DIAGNOSIS — S0081XD Abrasion of other part of head, subsequent encounter: Secondary | ICD-10-CM

## 2018-09-07 DIAGNOSIS — Z7901 Long term (current) use of anticoagulants: Secondary | ICD-10-CM

## 2018-09-07 DIAGNOSIS — I5022 Chronic systolic (congestive) heart failure: Secondary | ICD-10-CM | POA: Diagnosis not present

## 2018-09-07 DIAGNOSIS — Z751 Person awaiting admission to adequate facility elsewhere: Secondary | ICD-10-CM

## 2018-09-07 DIAGNOSIS — L89896 Pressure-induced deep tissue damage of other site: Secondary | ICD-10-CM | POA: Diagnosis present

## 2018-09-07 DIAGNOSIS — Z882 Allergy status to sulfonamides status: Secondary | ICD-10-CM

## 2018-09-07 DIAGNOSIS — R627 Adult failure to thrive: Secondary | ICD-10-CM | POA: Diagnosis present

## 2018-09-07 DIAGNOSIS — E1151 Type 2 diabetes mellitus with diabetic peripheral angiopathy without gangrene: Secondary | ICD-10-CM | POA: Diagnosis present

## 2018-09-07 DIAGNOSIS — I4819 Other persistent atrial fibrillation: Secondary | ICD-10-CM | POA: Diagnosis present

## 2018-09-07 DIAGNOSIS — Z20828 Contact with and (suspected) exposure to other viral communicable diseases: Secondary | ICD-10-CM | POA: Diagnosis not present

## 2018-09-07 DIAGNOSIS — I482 Chronic atrial fibrillation, unspecified: Secondary | ICD-10-CM | POA: Diagnosis present

## 2018-09-07 DIAGNOSIS — R296 Repeated falls: Secondary | ICD-10-CM | POA: Diagnosis present

## 2018-09-07 DIAGNOSIS — E118 Type 2 diabetes mellitus with unspecified complications: Secondary | ICD-10-CM | POA: Diagnosis present

## 2018-09-07 DIAGNOSIS — Z79899 Other long term (current) drug therapy: Secondary | ICD-10-CM

## 2018-09-07 DIAGNOSIS — I110 Hypertensive heart disease with heart failure: Secondary | ICD-10-CM | POA: Diagnosis present

## 2018-09-07 DIAGNOSIS — N390 Urinary tract infection, site not specified: Secondary | ICD-10-CM | POA: Diagnosis not present

## 2018-09-07 DIAGNOSIS — E669 Obesity, unspecified: Secondary | ICD-10-CM | POA: Diagnosis present

## 2018-09-07 DIAGNOSIS — M2550 Pain in unspecified joint: Secondary | ICD-10-CM | POA: Diagnosis not present

## 2018-09-07 DIAGNOSIS — K219 Gastro-esophageal reflux disease without esophagitis: Secondary | ICD-10-CM

## 2018-09-07 DIAGNOSIS — K5900 Constipation, unspecified: Secondary | ICD-10-CM | POA: Diagnosis not present

## 2018-09-07 DIAGNOSIS — Z7401 Bed confinement status: Secondary | ICD-10-CM | POA: Diagnosis not present

## 2018-09-07 DIAGNOSIS — Z6830 Body mass index (BMI) 30.0-30.9, adult: Secondary | ICD-10-CM | POA: Diagnosis not present

## 2018-09-07 DIAGNOSIS — L8991 Pressure ulcer of unspecified site, stage 1: Secondary | ICD-10-CM

## 2018-09-07 DIAGNOSIS — W19XXXD Unspecified fall, subsequent encounter: Secondary | ICD-10-CM | POA: Diagnosis present

## 2018-09-07 DIAGNOSIS — Z87891 Personal history of nicotine dependence: Secondary | ICD-10-CM

## 2018-09-07 DIAGNOSIS — L89152 Pressure ulcer of sacral region, stage 2: Secondary | ICD-10-CM | POA: Diagnosis not present

## 2018-09-07 DIAGNOSIS — I959 Hypotension, unspecified: Secondary | ICD-10-CM | POA: Diagnosis not present

## 2018-09-07 DIAGNOSIS — Z794 Long term (current) use of insulin: Secondary | ICD-10-CM

## 2018-09-07 DIAGNOSIS — W19XXXA Unspecified fall, initial encounter: Secondary | ICD-10-CM | POA: Diagnosis not present

## 2018-09-07 DIAGNOSIS — I252 Old myocardial infarction: Secondary | ICD-10-CM | POA: Diagnosis not present

## 2018-09-07 DIAGNOSIS — Z823 Family history of stroke: Secondary | ICD-10-CM

## 2018-09-07 LAB — CBC WITH DIFFERENTIAL/PLATELET
Abs Immature Granulocytes: 0.12 10*3/uL — ABNORMAL HIGH (ref 0.00–0.07)
Basophils Absolute: 0 10*3/uL (ref 0.0–0.1)
Basophils Relative: 0 %
Eosinophils Absolute: 0.1 10*3/uL (ref 0.0–0.5)
Eosinophils Relative: 2 %
HCT: 39 % (ref 36.0–46.0)
Hemoglobin: 13 g/dL (ref 12.0–15.0)
Immature Granulocytes: 1 %
Lymphocytes Relative: 17 %
Lymphs Abs: 1.5 10*3/uL (ref 0.7–4.0)
MCH: 31.7 pg (ref 26.0–34.0)
MCHC: 33.3 g/dL (ref 30.0–36.0)
MCV: 95.1 fL (ref 80.0–100.0)
Monocytes Absolute: 0.6 10*3/uL (ref 0.1–1.0)
Monocytes Relative: 7 %
Neutro Abs: 6.1 10*3/uL (ref 1.7–7.7)
Neutrophils Relative %: 73 %
Platelets: 175 10*3/uL (ref 150–400)
RBC: 4.1 MIL/uL (ref 3.87–5.11)
RDW: 14.6 % (ref 11.5–15.5)
WBC: 8.4 10*3/uL (ref 4.0–10.5)
nRBC: 0 % (ref 0.0–0.2)

## 2018-09-07 LAB — URINALYSIS, ROUTINE W REFLEX MICROSCOPIC
Bilirubin Urine: NEGATIVE
Glucose, UA: NEGATIVE mg/dL
Ketones, ur: NEGATIVE mg/dL
Nitrite: NEGATIVE
Protein, ur: NEGATIVE mg/dL
Specific Gravity, Urine: 1.004 — ABNORMAL LOW (ref 1.005–1.030)
pH: 6 (ref 5.0–8.0)

## 2018-09-07 LAB — BASIC METABOLIC PANEL
Anion gap: 11 (ref 5–15)
BUN: 21 mg/dL (ref 8–23)
CO2: 25 mmol/L (ref 22–32)
Calcium: 8.6 mg/dL — ABNORMAL LOW (ref 8.9–10.3)
Chloride: 100 mmol/L (ref 98–111)
Creatinine, Ser: 0.92 mg/dL (ref 0.44–1.00)
GFR calc Af Amer: >60 mL/min (ref >60)
GFR calc non Af Amer: 59 mL/min — ABNORMAL LOW (ref >60)
Glucose, Bld: 229 mg/dL — ABNORMAL HIGH (ref 70–99)
Potassium: 3.3 mmol/L — ABNORMAL LOW (ref 3.5–5.1)
Sodium: 136 mmol/L (ref 135–145)

## 2018-09-07 LAB — SARS CORONAVIRUS 2 BY RT PCR (HOSPITAL ORDER, PERFORMED IN ~~LOC~~ HOSPITAL LAB): SARS Coronavirus 2: NEGATIVE

## 2018-09-07 MED ORDER — SODIUM CHLORIDE 0.9% FLUSH: 10.0000 mL | INTRAVENOUS | Status: DC | PRN

## 2018-09-07 MED ORDER — POTASSIUM CHLORIDE CRYS ER 20 MEQ PO TBCR
40.0000 meq | EXTENDED_RELEASE_TABLET | Freq: Once | ORAL | Status: AC
  Administered 2018-09-07: 40 meq via ORAL
  Filled 2018-09-07: qty 2

## 2018-09-07 MED ORDER — SODIUM CHLORIDE 0.9 % IV SOLN: 1.0000 g | Freq: Once | INTRAVENOUS | Status: DC

## 2018-09-07 MED ORDER — SODIUM CHLORIDE 0.9 % IV BOLUS
1000.0000 mL | Freq: Once | INTRAVENOUS | Status: AC
  Administered 2018-09-07: 23:00:00 1000 mL via INTRAVENOUS

## 2018-09-07 MED ORDER — ENOXAPARIN SODIUM 40 MG/0.4ML ~~LOC~~ SOLN: 40.0000 mg | Freq: Every day | SUBCUTANEOUS | Status: DC

## 2018-09-07 MED ORDER — SODIUM CHLORIDE 0.9 % IV SOLN
1.0000 g | Freq: Every day | INTRAVENOUS | Status: DC
  Administered 2018-09-07 – 2018-09-11 (×5): 1 g via INTRAVENOUS
  Filled 2018-09-07 (×5): qty 1
  Filled 2018-09-07: qty 10

## 2018-09-07 MED ORDER — ONDANSETRON HCL 4 MG/2ML IJ SOLN: 4.0000 mg | Freq: Four times a day (QID) | INTRAMUSCULAR | Status: DC | PRN

## 2018-09-07 MED ORDER — ONDANSETRON HCL 4 MG PO TABS: 4.0000 mg | ORAL_TABLET | Freq: Four times a day (QID) | ORAL | Status: DC | PRN

## 2018-09-07 NOTE — ED Notes (Signed)
Home Health Nurse came to house for home health evaluation.  She reports family is not all in agreement on how to care for there mother.  They placed patient in recliner, she fell out and EMS came out and put her back in chair, she remained in chair until today.  Patient was in urine and has not been able to get up.  Family is unable to care for patient and can not physically care for her at her current mobility status.

## 2018-09-07 NOTE — H&P (Addendum)
History and Physical   IMAGINE NEST ION:[id number] DOB: 03/03/1940 DOA: 09/07/2018  Referring MD/NP/PA:   PCP: Deland Pretty, MD   Outpatient Specialists: Jola Schmidt, MD  Patient coming from: Home  Chief Complaint: Generalized weakness  HPI: Teresa Peck is a 78 y.o. female with medical history significant of persistent atrial fibrillation, CAD with occluded LAD status post PCI/stent to first diagonal, chronic systolic congestive heart failure EF of 30 to 35%, diabetes, GERD, gout, anemia of chronic disease, peripheral vascular disease, hyperlipidemia and hypertension who was recently discharged from the hospital after sustaining a fall at home.  She was diagnosed with acute metabolic encephalopathy with hypotension secondary to medications particularly narcotics.  Patient also had DKA and rhabdomyolysis from the fall.  She was discharged 2 days ago went home with a walker.  She has home health set up but patient came back since she is much weaker and could not do anything.  Family reported that she has been unable to get out of bed.  Her cardiologist is scheduled to see her in 2 to 3 weeks.  She is failing to thrive at home.  In the ER patient was noted to have foul-smelling almost purulent urine.  She also has multiple bruises in her neck and trunk which patient said is from previous fall.  She is being admitted therefore with a UTI, generalized weakness and failing to thrive at home..  ED Course: Temperature is 97 blood pressure 101/80 pulse 81 respiratory rate of 22 oxygen sats 91% on room air.  CBC entirely within normal.  Chemistry showed a potassium 3.3 with glucose 229 otherwise unchanged from previous.  Urinalysis showed large leukocytes WBC 21-50.  Also moderate blood.  Urine culture has been collected and patient is being admitted for treatment.  Her COVID-19 testing is negative.  Review of Systems: As per HPI otherwise 10 point review of systems negative.    Past Medical  History:  Diagnosis Date  . Anemia   . Arthritis   . CAD (coronary artery disease)   . CAD (coronary artery disease)   . Cancer ([place])    skin cancer  . CHF (congestive heart failure) ([place])   . Coronary artery disease    MI 2004-stent  . Diabetes mellitus   . Diabetic neuropathy ([place])   . GERD (gastroesophageal reflux disease)    tums  . Gout   . Hiatal hernia   . Hyperlipidemia   . Hypertension   . PAD (peripheral artery disease) ([place])   . Tremor     Past Surgical History:  Procedure Laterality Date  . BILATERAL CORNEA IMPLANTS  2010  . CARDIAC CATHETERIZATION     stent post MI   . CHOLECYSTECTOMY    . CORONARY ANGIOPLASTY     2004  stent  . EYE SURGERY    . [place]   right  . LUMBAR LAMINECTOMY/DECOMPRESSION MICRODISCECTOMY N/A 04/13/2015   Procedure: L4-5 Decompression;  Surgeon: Marybelle Killings, MD;  Location: [facility];  Service: Orthopedics;  Laterality: N/A;  Need RNFA     reports that she quit smoking about 40 years ago. Her smoking use included cigarettes. She has a 2.50 pack-year smoking history. She has never used smokeless tobacco. She reports that she does not drink alcohol or use drugs.  Allergies  Allergen Reactions  . Sulfur Rash    Rash/flushed, blood red eyes    Family History  Problem Relation Age of Onset  . Heart attack Father   .  Heart attack Sister   . Heart attack Brother   . CAD Mother   . Stroke Mother   . Heart disease Mother   . Cancer Mother      Prior to Admission medications   Medication Sig Start Date End Date Taking? Authorizing Provider  allopurinol (ZYLOPRIM) 300 MG tablet Take 300 mg by mouth daily.    [provider]  apixaban (ELIQUIS) 5 MG TABS tablet Take 1 tablet (5 mg total) by mouth 2 (two) times daily. 05/19/18   Nahser, Wonda Cheng, MD  furosemide (LASIX) 40 MG tablet Take 40 mg by mouth every other day.     [provider]  gabapentin (NEURONTIN) 600 MG tablet Take 600 mg by mouth 2 (two)  times daily.      [provider]  insulin glargine (LANTUS) 100 unit/mL SOPN Inject 0.1 mLs (10 Units total) into the skin daily. 09/05/18   [place] Donnamarie Poag, DO  Magnesium Oxide 400 (240 Mg) MG TABS Take 1 tablet (400 mg total) by mouth daily. 10/31/16   Nahser, Wonda Cheng, MD  metFORMIN (GLUCOPHAGE) 500 MG tablet Take 500 mg by mouth 2 (two) times daily with a meal.    [provider]  metoprolol succinate (TOPROL-XL) 25 MG 24 hr tablet Take 25 mg by mouth daily.    [provider]  nitroGLYCERIN (NITROSTAT) 0.4 MG SL tablet PLACE 1 TABLET UNDER THE TONGUE EVERY 5 MINUTES AS NEEDED FOR CHEST PAIN Patient taking differently: Place 0.4 mg under the tongue every 5 (five) minutes as needed for chest pain.  08/13/17   Nahser, Wonda Cheng, MD  RaNITidine HCl (ZANTAC PO) Take 1 tablet by mouth daily.    [provider]  RESTASIS 0.05 % ophthalmic emulsion Place 1 drop into both eyes 2 (two) times daily. 05/19/18   [provider]  rosuvastatin (CRESTOR) 10 MG tablet Take 10 mg by mouth daily.    [provider]    Physical Exam: Vitals:   09/07/18 2045 09/07/18 2130 09/07/18 2251 09/07/18 2320  BP: 121/82  (!) 116/57 132/83  Pulse: 62  (!) 57 (!) 59  Resp: 18  12 18   Temp:  (!) 97 F (36.1 C)  97.6 F (36.4 C)  TempSrc:  Rectal  Oral  SpO2: 98%  98% 99%  Weight:      Height:          Constitutional: NAD, acute on chronically ill looking Vitals:   09/07/18 2045 09/07/18 2130 09/07/18 2251 09/07/18 2320  BP: 121/82  (!) 116/57 132/83  Pulse: 62  (!) 57 (!) 59  Resp: 18  12 18   Temp:  (!) 97 F (36.1 C)  97.6 F (36.4 C)  TempSrc:  Rectal  Oral  SpO2: 98%  98% 99%  Weight:      Height:       Eyes: PERRL, lids and conjunctivae bruise face ENMT: Mucous membranes are moist. Posterior pharynx clear of any exudate or lesions.Normal dentition.  Neck: Neck bruising with circumferential puffiness, normal, supple, no masses, no thyromegaly  Respiratory: Coarse breath sounds bilaterally, no wheezing, no crackles. Normal respiratory effort. No accessory muscle use.  Cardiovascular: Irregularly irregular rate and rhythm, no murmurs / rubs / gallops. No extremity edema. 2+ pedal pulses. No carotid bruits.  Abdomen: Obese, no tenderness, no masses palpated. No hepatosplenomegaly. Bowel sounds positive.  Musculoskeletal: no clubbing / cyanosis. No joint deformity upper and lower extremities. Good ROM, no contractures. Normal muscle tone.  Skin: no rashes, lesions, ulcers. No induration Neurologic: CN 2-12 grossly intact. Sensation intact, DTR normal. Strength 5/5 in all 4.  Psychiatric: Normal judgment and insight. Alert and oriented x 3. Normal mood.     Labs on Admission: I have personally reviewed following labs and imaging studies  CBC: Recent Labs  Lab 09/02/18 0648 09/02/18 1431 09/03/18 0516 09/04/18 0826 09/07/18 1607  WBC 7.0 13.2* 10.8* 8.3 8.4  NEUTROABS  --   --   --   --  6.1  HGB 14.7 13.1 12.9 13.2 13.0  HCT 44.1 39.5 39.8 39.2 39.0  MCV 96.7 98.0 99.5 94.7 95.1  PLT 167 159 135* 135* 751   Basic Metabolic Panel: Recent Labs  Lab 09/02/18 0648 09/02/18 1431 09/03/18 0516 09/04/18 0317 09/05/18 0228 09/07/18 1607  NA 137 138 139 134* 133* 136  K 3.9 4.4 4.3 3.9 3.6 3.3*  CL 103 108 108 103 98 100  CO2 21* 22 19* 19* 25 25  GLUCOSE 226* 199* 120* 153* 142* 229*  BUN 70* 73* 62* 41* 28* 21  CREATININE 1.69* 1.82* 1.51* 1.24* 0.98 0.92  CALCIUM 8.9 8.6* 8.5* 8.5* 8.5* 8.6*  MG 2.2  --  2.1  --   --   --   PHOS  --   --  2.9  --   --   --    GFR: Estimated Creatinine Clearance: 50.2 mL/min (by C-G formula based on SCr of 0.92 mg/dL). Liver Function Tests: Recent Labs  Lab 09/02/18 0648 09/02/18 1431 09/03/18 0516 09/04/18 0317 09/05/18 0228  AST 1,898* 1,134* 474* 160* 99*  ALT 731* 584* 381* 220* 161*  ALKPHOS 168* 154* 145* 167* 218*  BILITOT 3.6* 3.6* 3.7* 3.1* 1.6*  PROT 5.9* 5.3*  5.4* 4.8* 4.9*  ALBUMIN 2.7* 2.3* 2.1* 1.9* 1.9*   No results for input(s): LIPASE, AMYLASE in the last 168 hours. No results for input(s): AMMONIA in the last 168 hours. Coagulation Profile: Recent Labs  Lab 09/02/18 1431 09/03/18 0516  INR 2.5* 2.6*   Cardiac Enzymes: Recent Labs  Lab 09/01/18 0310 09/02/18 0648 09/03/18 0516  CKTOTAL 1,562* 824* 350*  CKMB  --  13.5*  --    BNP (last 3 results) No results for input(s): PROBNP in the last 8760 hours. HbA1C: No results for input(s): HGBA1C in the last 72 hours. CBG: Recent Labs  Lab 09/04/18 0753 09/04/18 1205 09/04/18 1642 09/05/18 0729 09/05/18 1150  GLUCAP 138* 174* 236* 124* 213*   Lipid Profile: No results for input(s): CHOL, HDL, LDLCALC, TRIG, CHOLHDL, LDLDIRECT in the last 72 hours. Thyroid Function Tests: No results for input(s): TSH, T4TOTAL, FREET4, T3FREE, THYROIDAB in the last 72 hours. Anemia Panel: No results for input(s): VITAMINB12, FOLATE, FERRITIN, TIBC, IRON, RETICCTPCT in the last 72 hours. Urine analysis:    Component Value Date/Time   COLORURINE YELLOW 09/07/2018 1956   APPEARANCEUR CLEAR 09/07/2018 1956   LABSPEC 1.004 (L) 09/07/2018 1956   PHURINE 6.0 09/07/2018 1956   GLUCOSEU NEGATIVE 09/07/2018 1956   HGBUR MODERATE (A) 09/07/2018 1956   BILIRUBINUR NEGATIVE 09/07/2018 1956   KETONESUR NEGATIVE 09/07/2018 1956   PROTEINUR NEGATIVE 09/07/2018 1956   UROBILINOGEN 0.2 12/29/2011 1540   NITRITE NEGATIVE 09/07/2018 1956   LEUKOCYTESUR LARGE (A) 09/07/2018 1956   Sepsis Labs: @LABRCNTIP (procalcitonin:4,lacticidven:4) ) Recent Results (from the past 240 hour(s))  SARS Coronavirus 2 (CEPHEID - Performed in [facility] hospital lab), Hosp Order     Status: None   Collection Time: 09/01/18  2:00 AM   Specimen: Nasopharyngeal Swab  Result Value Ref Range Status   SARS Coronavirus 2 NEGATIVE NEGATIVE Final    Comment: (NOTE) If result is NEGATIVE SARS-CoV-2 target nucleic acids are  NOT DETECTED. The SARS-CoV-2 RNA is generally detectable in upper and lower  respiratory specimens during the acute phase of infection. The lowest  concentration of SARS-CoV-2 viral copies this assay can detect is 250  copies / mL. A negative result does not preclude SARS-CoV-2 infection  and should not be used as the sole basis for treatment or other  patient management decisions.  A negative result may occur with  improper specimen collection / handling, submission of specimen other  than nasopharyngeal swab, presence of viral mutation(s) within the  areas targeted by this assay, and inadequate number of viral copies  (<250 copies / mL). A negative result must be combined with clinical  observations, patient history, and epidemiological information. If result is POSITIVE SARS-CoV-2 target nucleic acids are DETECTED. The SARS-CoV-2 RNA is generally detectable in upper and lower  respiratory specimens dur ing the acute phase of infection.  Positive  results are indicative of active infection with SARS-CoV-2.  Clinical  correlation with patient history and other diagnostic information is  necessary to determine patient infection status.  Positive results do  not rule out bacterial infection or co-infection with other viruses. If result is PRESUMPTIVE POSTIVE SARS-CoV-2 nucleic acids MAY BE PRESENT.   A presumptive positive result was obtained on the submitted specimen  and confirmed on repeat testing.  While 2019 novel coronavirus  (SARS-CoV-2) nucleic acids may be present in the submitted sample  additional confirmatory testing may be necessary for epidemiological  and / or clinical management purposes  to differentiate between  SARS-CoV-2 and other Sarbecovirus currently known to infect humans.  If clinically indicated additional testing with an alternate test  methodology [phone]) is advised. The SARS-CoV-2 RNA is generally  detectable in upper and lower respiratory sp ecimens  during the acute  phase of infection. The expected result is Negative. Fact Sheet for Patients:  StrictlyIdeas.no Fact Sheet for Healthcare Providers: BankingDealers.co.za This test is not yet approved or cleared by the [place] FDA and has been authorized for detection and/or diagnosis of SARS-CoV-2 by FDA under an Emergency Use Authorization (EUA).  This EUA will remain in effect (meaning this test can be used) for the duration of the COVID-19 declaration under Section 564(b)(1) of the Act, 21 U.S.C. section 360bbb-3(b)(1), unless the authorization is terminated or revoked sooner. Performed at [facility], [place] [address] [place], [place] [address]   MRSA PCR Screening     Status: None   Collection Time: 09/02/18 11:28 AM   Specimen: Nasal Mucosa; Nasopharyngeal  Result Value Ref Range Status   MRSA by PCR NEGATIVE NEGATIVE Final    Comment:        The GeneXpert MRSA Assay (FDA approved for NASAL specimens only), is one component of a comprehensive MRSA colonization surveillance program. It is not intended to diagnose MRSA infection nor to guide or monitor treatment for MRSA infections. Performed at [facility], [place] [address] [place], [place] [address]   SARS Coronavirus 2 (CEPHEID - Performed in [facility] hospital lab), Hosp Order     Status: None   Collection Time: 09/07/18  9:32 PM   Specimen: Nasopharyngeal Swab  Result Value Ref Range Status   SARS Coronavirus 2 NEGATIVE NEGATIVE Final    Comment: (NOTE) If result  is NEGATIVE SARS-CoV-2 target nucleic acids are NOT DETECTED. The SARS-CoV-2 RNA is generally detectable in upper and lower  respiratory specimens during the acute phase of infection. The lowest  concentration of SARS-CoV-2 viral copies this assay can detect is 250  copies / mL. A negative result does not preclude SARS-CoV-2 infection  and should not be used as the sole basis for  treatment or other  patient management decisions.  A negative result may occur with  improper specimen collection / handling, submission of specimen other  than nasopharyngeal swab, presence of viral mutation(s) within the  areas targeted by this assay, and inadequate number of viral copies  (<250 copies / mL). A negative result must be combined with clinical  observations, patient history, and epidemiological information. If result is POSITIVE SARS-CoV-2 target nucleic acids are DETECTED. The SARS-CoV-2 RNA is generally detectable in upper and lower  respiratory specimens dur ing the acute phase of infection.  Positive  results are indicative of active infection with SARS-CoV-2.  Clinical  correlation with patient history and other diagnostic information is  necessary to determine patient infection status.  Positive results do  not rule out bacterial infection or co-infection with other viruses. If result is PRESUMPTIVE POSTIVE SARS-CoV-2 nucleic acids MAY BE PRESENT.   A presumptive positive result was obtained on the submitted specimen  and confirmed on repeat testing.  While 2019 novel coronavirus  (SARS-CoV-2) nucleic acids may be present in the submitted sample  additional confirmatory testing may be necessary for epidemiological  and / or clinical management purposes  to differentiate between  SARS-CoV-2 and other Sarbecovirus currently known to infect humans.  If clinically indicated additional testing with an alternate test  methodology [phone]) is advised. The SARS-CoV-2 RNA is generally  detectable in upper and lower respiratory sp ecimens during the acute  phase of infection. The expected result is Negative. Fact Sheet for Patients:  StrictlyIdeas.no Fact Sheet for Healthcare Providers: BankingDealers.co.za This test is not yet approved or cleared by the [place] FDA and has been authorized for detection and/or  diagnosis of SARS-CoV-2 by FDA under an Emergency Use Authorization (EUA).  This EUA will remain in effect (meaning this test can be used) for the duration of the COVID-19 declaration under Section 564(b)(1) of the Act, 21 U.S.C. section 360bbb-3(b)(1), unless the authorization is terminated or revoked sooner. Performed at [facility], [place] [address], [place], [place] [address]      Radiological Exams on Admission: No results found.  Assessment/Plan Principal Problem:   Weakness generalized Active Problems:   Type 2 diabetes mellitus with complication, without long-term current use of insulin (HCC)   Hyperlipidemia   CAD (coronary artery disease)   Chronic systolic CHF (congestive heart failure) (HCC)   Chronic atrial fibrillation   Acute lower UTI   GERD (gastroesophageal reflux disease)   Pressure injury of skin     #1 generalized weakness: Most likely multifactorial.  May be due to UTI, pressure ulcer of the skin was also noted.  Patient also has been just left the hospital.  Request PT and OT at home which she is so far yet to get.  We will readmit the patient.  Get physical therapy and Occupational Therapy.  Plan possible skilled nursing placement  #2 chronic atrial fibrillation: Continue home regimen.  Patient appears to be controlled at the moment.  Continue apixaban  #3 UTI: Urine culture has been collected.  Start empiric Rocephin and monitor.  #4 hypertension: Blood  pressure appears controlled at the moment.  No change in treatment.  #5 GERD: Continue with PPIs.  #6 chronic systolic dysfunction: At this point is compensated.  Continue with her home regimen.  #7 diabetes: Blood sugar is controlled.  Sliding scale insulin will be continued with her home regimen.  #8 coronary artery disease: Patient appears to be at baseline.  No evidence of decompensation.   DVT prophylaxis: Apixaban Code Status: Full code Family Communication: Daughter over the phone  Disposition Plan: Probably skilled nursing facility Consults called: None Admission status: Inpatient  Severity of Illness: The appropriate patient status for this patient is INPATIENT. Inpatient status is judged to be reasonable and necessary in order to provide the required intensity of service to ensure the patient's safety. The patient's presenting symptoms, physical exam findings, and initial radiographic and laboratory data in the context of their chronic comorbidities is felt to place them at high risk for further clinical deterioration. Furthermore, it is not anticipated that the patient will be medically stable for discharge from the hospital within 2 midnights of admission. The following factors support the patient status of inpatient.   " The patient's presenting symptoms include generalized weakness. " The worrisome physical exam findings include evidence of UTI but also bruises. " The initial radiographic and laboratory data are worrisome because of the urinalysis is normal. " The chronic co-morbidities include recurrent falls with diabetes.   * I certify that at the point of admission it is my clinical judgment that the patient will require inpatient hospital care spanning beyond 2 midnights from the point of admission due to high intensity of service, high risk for further deterioration and high frequency of surveillance required.Barbette Merino MD [facility] Pager [phone]  If 7PM-7AM, please contact night-coverage [email or web address] Password [facility]  09/07/2018, 11:56 PM

## 2018-09-07 NOTE — ED Provider Notes (Signed)
[facility] EMERGENCY DEPARTMENT Provider Note   CSN: [id number] Arrival date & time: 09/07/18  1529    History   Chief Complaint Chief Complaint  Patient presents with  . Generalized Body Aches    HPI Teresa Peck is a 78 y.o. female.     HPI   States that since hospital discharge, 3 days ago she has been unable to walk.  She states she is also unable to stand, and feels too weak to walk.  She was recently hospitalized after a fall injuring her face.  Recently she has noticed some burning with urination.  She denies fever, chills, cough, shortness of breath, abdominal or back pain.  She complains of a sore on her sacral area, and leg swelling, since she has been sitting, other times since hospital discharge.  She states that she is taking her prescribed medications.  There are no other known modifying factors.  Past Medical History:  Diagnosis Date  . Anemia   . Arthritis   . CAD (coronary artery disease)   . CAD (coronary artery disease)   . Cancer ([place])    skin cancer  . CHF (congestive heart failure) ([place])   . Coronary artery disease    MI 2004-stent  . Diabetes mellitus   . Diabetic neuropathy ([place])   . GERD (gastroesophageal reflux disease)    tums  . Gout   . Hiatal hernia   . Hyperlipidemia   . Hypertension   . PAD (peripheral artery disease) ([place])   . Tremor     Patient Active Problem List   Diagnosis Date Noted  . Chronic atrial fibrillation 10/30/2016  . Lumbar stenosis with neurogenic claudication 04/13/2015  . Chronic systolic CHF (congestive heart failure) ([place]) 10/19/2014  . DIAB W/O COMP TYPE II/UNS NOT STATED UNCNTRL 11/08/2009  . Hyperlipidemia 11/08/2009  . CAD (coronary artery disease) 11/08/2009  . CARDIOMYOPATHY, ISCHEMIC 11/08/2009    Past Surgical History:  Procedure Laterality Date  . BILATERAL CORNEA IMPLANTS  2010  . CARDIAC CATHETERIZATION     stent post MI   . CHOLECYSTECTOMY    . CORONARY ANGIOPLASTY      2004  stent  . EYE SURGERY    . [place]   right  . LUMBAR LAMINECTOMY/DECOMPRESSION MICRODISCECTOMY N/A 04/13/2015   Procedure: L4-5 Decompression;  Surgeon: Marybelle Killings, MD;  Location: [facility];  Service: Orthopedics;  Laterality: N/A;  Need RNFA     OB History   No obstetric history on file.      Home Medications    Prior to Admission medications   Medication Sig Start Date End Date Taking? Authorizing Provider  allopurinol (ZYLOPRIM) 300 MG tablet Take 300 mg by mouth daily.    [provider]  apixaban (ELIQUIS) 5 MG TABS tablet Take 1 tablet (5 mg total) by mouth 2 (two) times daily. 05/19/18   Nahser, Wonda Cheng, MD  furosemide (LASIX) 40 MG tablet Take 40 mg by mouth every other day.     [provider]  gabapentin (NEURONTIN) 600 MG tablet Take 600 mg by mouth 2 (two) times daily.      [provider]  insulin glargine (LANTUS) 100 unit/mL SOPN Inject 0.1 mLs (10 Units total) into the skin daily. 09/05/18   [place] Donnamarie Poag, DO  Magnesium Oxide 400 (240 Mg) MG TABS Take 1 tablet (400 mg total) by mouth daily. 10/31/16   Nahser, Wonda Cheng, MD  metFORMIN (GLUCOPHAGE) 500 MG  tablet Take 500 mg by mouth 2 (two) times daily with a meal.    [provider]  metoprolol succinate (TOPROL-XL) 25 MG 24 hr tablet Take 25 mg by mouth daily.    [provider]  nitroGLYCERIN (NITROSTAT) 0.4 MG SL tablet PLACE 1 TABLET UNDER THE TONGUE EVERY 5 MINUTES AS NEEDED FOR CHEST PAIN Patient taking differently: Place 0.4 mg under the tongue every 5 (five) minutes as needed for chest pain.  08/13/17   Nahser, Wonda Cheng, MD  RaNITidine HCl (ZANTAC PO) Take 1 tablet by mouth daily.    [provider]  RESTASIS 0.05 % ophthalmic emulsion Place 1 drop into both eyes 2 (two) times daily. 05/19/18   [provider]  rosuvastatin (CRESTOR) 10 MG tablet Take 10 mg by mouth daily.    [provider]    Family History Family History   Problem Relation Age of Onset  . Heart attack Father   . Heart attack Sister   . Heart attack Brother   . CAD Mother   . Stroke Mother   . Heart disease Mother   . Cancer Mother     Social History Social History   Tobacco Use  . Smoking status: Former Smoker    Packs/day: 0.50    Years: 5.00    Pack years: 2.50    Types: Cigarettes    Quit date: 02/19/1978    Years since quitting: 40.5  . Smokeless tobacco: Never Used  . Tobacco comment: Quit 1982  Substance Use Topics  . Alcohol use: No    Alcohol/week: 0.0 standard drinks  . Drug use: No     Allergies   Sulfur   Review of Systems Review of Systems  All other systems reviewed and are negative.    Physical Exam Updated Vital Signs BP 112/67   Pulse (!) 57   Resp 14   Ht 5\' 4"  (1.626 m)   Wt 80.9 kg   SpO2 98%   BMI 30.61 kg/m   Physical Exam Vitals signs and nursing note reviewed.  Constitutional:      General: She is not in acute distress.    Appearance: She is well-developed. She is obese. She is not ill-appearing, toxic-appearing or diaphoretic.  HENT:     Head: Normocephalic and atraumatic.     Right Ear: External ear normal.     Left Ear: External ear normal.  Eyes:     Conjunctiva/sclera: Conjunctivae normal.     Pupils: Pupils are equal, round, and reactive to light.  Neck:     Musculoskeletal: Normal range of motion and neck supple.     Trachea: Phonation normal.  Cardiovascular:     Rate and Rhythm: Normal rate and regular rhythm.     Heart sounds: Normal heart sounds.  Pulmonary:     Effort: Pulmonary effort is normal.     Breath sounds: Normal breath sounds.  Abdominal:     Palpations: Abdomen is soft.     Tenderness: There is no abdominal tenderness.  Musculoskeletal: Normal range of motion.  Skin:    General: Skin is warm and dry.     Comments: Subacute injuries: abrasion right face, healing without infection; anterior neck ecchymosis, dependent appearance without significant  swelling.  Stage I pressure sore upper buttock, kissing lesions.  No associated induration, bleeding or drainage.  Neurological:     Mental Status: She is alert and oriented to person, place, and time.     Cranial Nerves: No  cranial nerve deficit.     Sensory: No sensory deficit.     Motor: No abnormal muscle tone.     Coordination: Coordination normal.  Psychiatric:        Mood and Affect: Mood normal.        Behavior: Behavior normal.        Thought Content: Thought content normal.        Judgment: Judgment normal.      ED Treatments / Results  Labs (all labs ordered are listed, but only abnormal results are displayed) Labs Reviewed  BASIC METABOLIC PANEL - Abnormal; Notable for the following components:      Result Value   Potassium 3.3 (*)    Glucose, Bld 229 (*)    Calcium 8.6 (*)    GFR calc non Af Amer 59 (*)    All other components within normal limits  CBC WITH DIFFERENTIAL/PLATELET - Abnormal; Notable for the following components:   Abs Immature Granulocytes 0.12 (*)    All other components within normal limits  SARS CORONAVIRUS 2 (HOSPITAL ORDER, [facility])  URINALYSIS, ROUTINE W REFLEX MICROSCOPIC    EKG None  Radiology No results found.  Procedures Procedures (including critical care time)  Medications Ordered in ED Medications  sodium chloride 0.9 % bolus 1,000 mL (has no administration in time range)  potassium chloride SA (K-DUR) CR tablet 40 mEq (has no administration in time range)     Initial Impression / Assessment and Plan / ED Course  I have reviewed the triage vital signs and the nursing notes.  Pertinent labs & imaging results that were available during my care of the patient were reviewed by me and considered in my medical decision making (see chart for details).  Clinical Course as of Sep 06 1908  Sun Sep 07, 2018  1826 Normal except potassium low, glucose high, calcium low, GFR low  Basic metabolic  panel(!) [EW]  0355 Normal  CBC with Differential(!) [EW]    Clinical Course User Index [EW] Daleen Bo, MD        Patient Vitals for the past 24 hrs:  BP Pulse Resp SpO2 Height Weight  09/07/18 1845 112/67 (!) 57 14 98 % - -  09/07/18 1830 112/74 81 (!) 22 95 % - -  09/07/18 1815 117/74 68 15 94 % - -  09/07/18 1800 136/88 75 18 95 % - -  09/07/18 1745 126/62 (!) 57 15 97 % - -  09/07/18 1730 127/77 71 17 98 % - -  09/07/18 1715 122/67 - 15 - - -  09/07/18 1700 131/77 (!) 59 11 97 % - -  09/07/18 1542 - - - - 5\' 4"  (1.626 m) 80.9 kg  09/07/18 1540 132/77 71 16 98 % - -    7:10 PM Reevaluation with update and discussion. After initial assessment and treatment, an updated evaluation reveals no change in clinical status, patient updated on findings and plan. Daleen Bo   Medical Decision Making: Malaise and weakness, following recent hospitalization for fall associated with uncontrolled atrial fibrillation.  She was also in DKA.  She presents for evaluation of weakness and inability to walk.  Recent hospitalization was complicated by rhabdomyolysis, uncontrolled atrial fibrillation, renal insufficiency, hypotension, transaminitis and deconditioning.  Patient with malaise and weakness, no overt clinical signs of illness.  New stage I pressure sore buttocks, without signs for localized infection.  Patient will require additional evaluation, and possibly PT screening.  Case  management involved with discussions for placement versus admission.  CRITICAL CARE-no Performed by: Daleen Bo  Nursing Notes Reviewed/ Care Coordinated Applicable Imaging Reviewed Interpretation of Laboratory Data incorporated into ED treatment  Plan-disposition per oncoming provider team in conjunction with social services.  Final Clinical Impressions(s) / ED Diagnoses   Final diagnoses:  Weakness  Pressure injury, stage 1, unspecified location    ED Discharge Orders    None       Daleen Bo, MD 09/07/18 1910

## 2018-09-07 NOTE — ED Provider Notes (Signed)
8:49 PM Patient is 78 years old with multiple comorbidities.  She has become so weak over the past several days that she was unable to get up out of her chair.  Brought to the emergency department.  She lives at home independently.  She is found to have florid urinary tract infection here with frank pyuria on in and out catheterization.  Patient will be admitted for IV antibiotics and PT OT consult.  She is unsafe to return home at this time.  Hopefully with antibiotics and improvement in her urinary tract infection her movement will improve to the point where she can be returned back home safely.   Jola Schmidt, MD 09/07/18 2050

## 2018-09-07 NOTE — Progress Notes (Signed)
TOC CM received call from attending. Pt was recent admission with dc to home with son on 09/05/2018, returns to ED unable to walk. Pt declined SNF last admission but wants SNF rehab. Will need PT evaluation for SNF. TOC CM and CSW will follow for SNF placement on 09/08/2018. Jonnie Finner RN CCM Case Mgmt phone [phone]

## 2018-09-07 NOTE — ED Notes (Signed)
Patient states she has been unable to walk and she is in a lot of pain on her bottom from not being able to get up and she has slid out of the chair at home over the weekend.

## 2018-09-07 NOTE — ED Triage Notes (Signed)
Patient and family wanted patient to return to ER, she has not been able to walk at home since discharge last Friday, she is having 10/10 pain on her sacral wound, she has swollen feet, , multiple ulcers on her feet, Patient states she hurts everywhere and does not want to feel this way.

## 2018-09-07 NOTE — ED Provider Notes (Signed)
1840: pt under initial care of Dr Eulis Foster. See previous note for full H&P.   Here for failure to thrive, family unable to care for her.   EDP noted skin tear of buttocks no signs of infection. Orthostatics unable to be done, pt did not want to stand.  SW consulted, recommended PT evaluation  Physical Exam  BP 132/77 (BP Location: Right Arm)   Pulse 71   Resp 16   Ht 5\' 4"  (1.626 m)   Wt 80.9 kg   SpO2 98%   BMI 30.61 kg/m   Physical Exam Constitutional:      Appearance: She is well-developed.  HENT:     Head: Normocephalic.     Nose: Nose normal.  Eyes:     General: Lids are normal.  Neck:     Musculoskeletal: Normal range of motion.  Cardiovascular:     Rate and Rhythm: Normal rate.  Pulmonary:     Effort: Pulmonary effort is normal. No respiratory distress.  Musculoskeletal: Normal range of motion.  Neurological:     Mental Status: She is alert.  Psychiatric:        Behavior: Behavior normal.     ED Course/Procedures   Clinical Course as of Sep 06 2052  Sun Sep 07, 2018  1826 Normal except potassium low, glucose high, calcium low, GFR low  Basic metabolic panel(!) [EW]  2035 Normal  CBC with Differential(!) [EW]  2052 Leukocytes,Ua(!): LARGE [CG]  2052 WBC, UA: 21-50 [CG]  2052 Bacteria, UA(!): MANY [CG]    Clinical Course User Index [CG] Kinnie Feil, PA-C [EW] Daleen Bo, MD    Procedures  MDM   2055: ER work-up reviewed by me.  UA consistent with infection.  SPO2 has been low in the low 90s.  Patient has not been able to ambulate here in the ER.  Failure to thrive.  Patient discussed with Dr. Venora Maples.  Will discuss with medicine team for possible admission.       Kinnie Feil, PA-C 09/07/18 2056    Daleen Bo, MD 09/08/18 1131

## 2018-09-07 NOTE — ED Notes (Signed)
Cleaned pt up from BM, cleaned out labia from BM also. In and out performed to get a clean sample, sent to lab UA ands culture.

## 2018-09-07 NOTE — ED Notes (Signed)
Patient is tolerating food and drink.

## 2018-09-08 DIAGNOSIS — I482 Chronic atrial fibrillation, unspecified: Secondary | ICD-10-CM

## 2018-09-08 DIAGNOSIS — E118 Type 2 diabetes mellitus with unspecified complications: Secondary | ICD-10-CM

## 2018-09-08 DIAGNOSIS — K219 Gastro-esophageal reflux disease without esophagitis: Secondary | ICD-10-CM

## 2018-09-08 DIAGNOSIS — N390 Urinary tract infection, site not specified: Secondary | ICD-10-CM

## 2018-09-08 DIAGNOSIS — I5022 Chronic systolic (congestive) heart failure: Secondary | ICD-10-CM

## 2018-09-08 DIAGNOSIS — N3000 Acute cystitis without hematuria: Secondary | ICD-10-CM

## 2018-09-08 LAB — GLUCOSE, CAPILLARY
Glucose-Capillary: 196 mg/dL — ABNORMAL HIGH (ref 70–99)
Glucose-Capillary: 159 mg/dL — ABNORMAL HIGH (ref 70–99)
Glucose-Capillary: 177 mg/dL — ABNORMAL HIGH (ref 70–99)
Glucose-Capillary: 123 mg/dL — ABNORMAL HIGH (ref 70–99)
Glucose-Capillary: 198 mg/dL — ABNORMAL HIGH (ref 70–99)

## 2018-09-08 LAB — CBC
HCT: 40.5 % (ref 36.0–46.0)
Hemoglobin: 13.6 g/dL (ref 12.0–15.0)
MCH: 31.6 pg (ref 26.0–34.0)
MCHC: 33.6 g/dL (ref 30.0–36.0)
MCV: 94.2 fL (ref 80.0–100.0)
Platelets: 187 10*3/uL (ref 150–400)
RBC: 4.3 MIL/uL (ref 3.87–5.11)
RDW: 14.7 % (ref 11.5–15.5)
WBC: 9.1 10*3/uL (ref 4.0–10.5)
nRBC: 0 % (ref 0.0–0.2)

## 2018-09-08 LAB — COMPREHENSIVE METABOLIC PANEL
ALT: 87 U/L — ABNORMAL HIGH (ref 0–44)
AST: 48 U/L — ABNORMAL HIGH (ref 15–41)
Albumin: 2.3 g/dL — ABNORMAL LOW (ref 3.5–5.0)
Alkaline Phosphatase: 199 U/L — ABNORMAL HIGH (ref 38–126)
Anion gap: 11 (ref 5–15)
BUN: 16 mg/dL (ref 8–23)
CO2: 25 mmol/L (ref 22–32)
Calcium: 8.4 mg/dL — ABNORMAL LOW (ref 8.9–10.3)
Chloride: 104 mmol/L (ref 98–111)
Creatinine, Ser: 0.82 mg/dL (ref 0.44–1.00)
GFR calc Af Amer: >60 mL/min (ref >60)
GFR calc non Af Amer: >60 mL/min (ref >60)
Glucose, Bld: 177 mg/dL — ABNORMAL HIGH (ref 70–99)
Potassium: 3.8 mmol/L (ref 3.5–5.1)
Sodium: 140 mmol/L (ref 135–145)
Total Bilirubin: 1.1 mg/dL (ref 0.3–1.2)
Total Protein: 5.5 g/dL — ABNORMAL LOW (ref 6.5–8.1)

## 2018-09-08 LAB — URINE CULTURE

## 2018-09-08 MED ORDER — MAGNESIUM OXIDE 400 (241.3 MG) MG PO TABS
400.0000 mg | ORAL_TABLET | Freq: Every day | ORAL | Status: DC
  Administered 2018-09-08 – 2018-09-12 (×5): 400 mg via ORAL
  Filled 2018-09-08 (×4): qty 1

## 2018-09-08 MED ORDER — TRAMADOL HCL 50 MG PO TABS
50.0000 mg | ORAL_TABLET | Freq: Two times a day (BID) | ORAL | Status: DC | PRN
  Administered 2018-09-09 – 2018-09-12 (×5): 50 mg via ORAL
  Filled 2018-09-08 (×5): qty 1

## 2018-09-08 MED ORDER — INSULIN GLARGINE 100 UNIT/ML ~~LOC~~ SOLN
10.0000 [IU] | Freq: Every day | SUBCUTANEOUS | Status: DC
  Administered 2018-09-08 – 2018-09-12 (×5): 10 [IU] via SUBCUTANEOUS
  Filled 2018-09-08 (×5): qty 0.1

## 2018-09-08 MED ORDER — ALLOPURINOL 300 MG PO TABS
300.0000 mg | ORAL_TABLET | Freq: Every day | ORAL | Status: DC
  Administered 2018-09-08 – 2018-09-12 (×5): 300 mg via ORAL
  Filled 2018-09-08 (×5): qty 1

## 2018-09-08 MED ORDER — METOPROLOL SUCCINATE ER 25 MG PO TB24
25.0000 mg | ORAL_TABLET | Freq: Every day | ORAL | Status: DC
  Administered 2018-09-08 – 2018-09-12 (×5): 25 mg via ORAL
  Filled 2018-09-08 (×5): qty 1

## 2018-09-08 MED ORDER — FAMOTIDINE 20 MG PO TABS
20.0000 mg | ORAL_TABLET | Freq: Two times a day (BID) | ORAL | Status: DC
  Administered 2018-09-08 – 2018-09-12 (×9): 20 mg via ORAL
  Filled 2018-09-08 (×9): qty 1

## 2018-09-08 MED ORDER — NITROGLYCERIN 0.4 MG SL SUBL: 0.4000 mg | SUBLINGUAL_TABLET | SUBLINGUAL | Status: DC | PRN

## 2018-09-08 MED ORDER — FLEET ENEMA 7-19 GM/118ML RE ENEM
1.0000 | ENEMA | Freq: Once | RECTAL | Status: AC
  Administered 2018-09-08: 13:00:00 1 via RECTAL
  Filled 2018-09-08: qty 1

## 2018-09-08 MED ORDER — INSULIN ASPART 100 UNIT/ML ~~LOC~~ SOLN
0.0000 [IU] | Freq: Every day | SUBCUTANEOUS | Status: DC
  Administered 2018-09-09: 2 [IU] via SUBCUTANEOUS

## 2018-09-08 MED ORDER — APIXABAN 5 MG PO TABS
5.0000 mg | ORAL_TABLET | Freq: Two times a day (BID) | ORAL | Status: DC
  Administered 2018-09-08 – 2018-09-12 (×9): 5 mg via ORAL
  Filled 2018-09-08 (×9): qty 1

## 2018-09-08 MED ORDER — INSULIN ASPART 100 UNIT/ML ~~LOC~~ SOLN
0.0000 [IU] | Freq: Three times a day (TID) | SUBCUTANEOUS | Status: DC
  Administered 2018-09-08: 1 [IU] via SUBCUTANEOUS
  Administered 2018-09-08 – 2018-09-09 (×5): 2 [IU] via SUBCUTANEOUS
  Administered 2018-09-10 – 2018-09-11 (×3): 3 [IU] via SUBCUTANEOUS
  Administered 2018-09-11: 08:00:00 1 [IU] via SUBCUTANEOUS
  Administered 2018-09-11 – 2018-09-12 (×4): 2 [IU] via SUBCUTANEOUS

## 2018-09-08 MED ORDER — GABAPENTIN 600 MG PO TABS
600.0000 mg | ORAL_TABLET | Freq: Two times a day (BID) | ORAL | Status: DC
  Administered 2018-09-08 – 2018-09-12 (×9): 600 mg via ORAL
  Filled 2018-09-08 (×9): qty 1

## 2018-09-08 MED ORDER — ACETAMINOPHEN 325 MG PO TABS
650.0000 mg | ORAL_TABLET | Freq: Four times a day (QID) | ORAL | Status: DC | PRN
  Administered 2018-09-08 – 2018-09-12 (×6): 650 mg via ORAL
  Filled 2018-09-08 (×6): qty 2

## 2018-09-08 MED ORDER — ROSUVASTATIN CALCIUM 5 MG PO TABS
10.0000 mg | ORAL_TABLET | Freq: Every day | ORAL | Status: DC
  Administered 2018-09-08 – 2018-09-12 (×5): 10 mg via ORAL
  Filled 2018-09-08 (×6): qty 2

## 2018-09-08 MED ORDER — FUROSEMIDE 40 MG PO TABS
40.0000 mg | ORAL_TABLET | ORAL | Status: DC
  Administered 2018-09-08 – 2018-09-12 (×3): 40 mg via ORAL
  Filled 2018-09-08 (×3): qty 1

## 2018-09-08 MED ORDER — CYCLOSPORINE 0.05 % OP EMUL
1.0000 [drp] | Freq: Two times a day (BID) | OPHTHALMIC | Status: DC
  Administered 2018-09-08 – 2018-09-12 (×9): 1 [drp] via OPHTHALMIC
  Filled 2018-09-08 (×11): qty 30

## 2018-09-08 NOTE — TOC Initial Note (Signed)
Transition of Care [facility]) - Initial/Assessment Note    Patient Details  Name: Teresa Peck MRN: [id number] Date of Birth: 05-07-1940  Transition of Care [facility]) CM/SW Contact:    Alexander Mt, Frederic Phone Number: 09/08/2018, 10:09 AM  Clinical Narrative:                 CSW spoke with pt at bedside, introduced self, role, reason for visit. Pt started coversation with saying that she wants to be able to get in and out of the bed and take care of herself again. She is upset that she left the hospital before she was walking again last admission. We discussed that PT had seen her once and then she went home- CSW explained that therapy services here at the hospital are to assess pt prior and current level of functioning and to make recommendations for next steps for pt. Pt states understanding but wants to know where rehabs are.   Pt from home with her daughter in law providing care, from our conversation this has been challenging and pt has been sleeping in the recliner. Pt son and grandson also confirmed as contact. Pt agreeable to SNF and understands referral process if needed after PT sees her today. Pt gives permission for [facility] to be contacted.   Continue to follow.   Expected Discharge Plan: Skilled Nursing Facility Barriers to Discharge: Continued Medical Work up, Ship broker  Patient Goals and CMS Choice  Pt Goal: to be able to sit up in the bed and take care of herself again CMS Medicare.gov Compare Post Acute Care list provided to:: Patient(and adult children) Choice offered to / list presented to : Patient, Adult Children  Expected Discharge Plan and Services Expected Discharge Plan: [place] In-house Referral: Clinical Social Work Discharge Planning Services: CM Consult Post Acute Care Choice: [place] arrangements for the past 2 months: [place]  Prior Living Arrangements/Services Living arrangements for  the past 2 months: Single Family Home Lives with:: Self Patient language and need for interpreter reviewed:: Yes(no needs) Do you feel safe going back to the place where you live?: Yes      Need for Family Participation in Patient Care: Yes (Comment) Care giver support system in place?: Yes (comment) Current home services: DME, Home PT, Home OT Criminal Activity/Legal Involvement Pertinent to Current Situation/Hospitalization: No - Comment as needed  Activities of Daily Living      Permission Sought/Granted   Permission granted to share information with : Yes, Verbal Permission Granted  Share Information with NAME: Timika Muench  Permission granted to share info w AGENCY: SNFs  Permission granted to share info w Relationship: daughter in law  Permission granted to share info w Contact Information: [phone]  Emotional Assessment Appearance:: Appears stated age Attitude/Demeanor/Rapport: Engaged Affect (typically observed): Adaptable, Accepting, Appropriate, Overwhelmed Orientation: : Oriented to Self, Oriented to Place, Oriented to  Time, Oriented to Situation Alcohol / Substance Use: Not Applicable Psych Involvement: No (comment)  Admission diagnosis:  Weakness [R53.1] Acute cystitis without hematuria [N30.00] Pressure injury, stage 1, unspecified location [L89.91] Patient Active Problem List   Diagnosis Date Noted  . Acute lower UTI 09/07/2018  . GERD (gastroesophageal reflux disease) 09/07/2018  . Weakness generalized 09/07/2018  . Pressure injury of skin 09/07/2018  . Chronic atrial fibrillation 10/30/2016  . Lumbar stenosis with neurogenic claudication 04/13/2015  . Chronic systolic CHF (congestive heart failure) ([place]) 10/19/2014  . Type 2 diabetes mellitus with complication, without  long-term current use of insulin ([place]) 11/08/2009  . Hyperlipidemia 11/08/2009  . CAD (coronary artery disease) 11/08/2009  . CARDIOMYOPATHY, ISCHEMIC 11/08/2009   PCP:  Deland Pretty, MD Pharmacy:   [facility]), [place] - [address] [place] [address] Phone: [phone] Fax: [phone]  [facility] [place], [address] [place] [address] Phone: [phone] Fax: [phone]     Social Determinants of Health (SDOH) Interventions    Readmission Risk Interventions No flowsheet data found.

## 2018-09-08 NOTE — Evaluation (Signed)
Occupational Therapy Evaluation Patient Details Name: Teresa Peck MRN: [id number] DOB: Aug 08, 1940 Today's Date: 09/08/2018    History of Present Illness 78 year old with PMH including hypertension, hyperlipidemia, Dm2 w neuropathy, CAD , CHF , PAD, Anemia found 7/12 after being down on the floor for 2 to 3 days after mechanical fall.  Noted to have DKA, rhabdomyolysis, A. fib with RVR, elevated LFTs.  Overall she had been improving.  Rapid response called, transferred to ICU on 7/14 for altered mental status, hypotension.    Clinical Impression   Pt recently discharged (Therapy recommending SNF) from [facility] and unable to function at home. Became weaker and weaker. Pt today is mod A +2 safety for bed mobility, use of stedy and mod A +2 for transfer to recliner from EOB, decreased cognition, safety awareness. Max A for LB ADL at this time at least min A for UB ADL. Will benefit from skilled OT in the acute setting as well as afterwards at the SNF level.    Follow Up Recommendations  SNF;Supervision/Assistance - 24 hour    Equipment Recommendations  Other (comment)(defer to next venue)    Recommendations for Other Services       Precautions / Restrictions Precautions Precautions: Fall Restrictions Weight Bearing Restrictions: No      Mobility Bed Mobility Overal bed mobility: Needs Assistance Bed Mobility: Supine to Sit     Supine to sit: Mod assist;HOB elevated;+2 for safety/equipment     General bed mobility comments: multimodal cues for sequencing, use of bed pad to assist with hips  Transfers Overall transfer level: Needs assistance Equipment used: 2 person hand held assist Transfers: Sit to/from Stand Sit to Stand: +2 physical assistance;+2 safety/equipment;From elevated surface;Mod assist         General transfer comment: Pt able to pull up on stedy and achieve standing with mod A of 2    Balance Overall balance assessment: Needs assistance Sitting-balance  support: Bilateral upper extremity supported;Feet unsupported Sitting balance-Leahy Scale: Fair     Standing balance support: Single extremity supported Standing balance-Leahy Scale: Poor                             ADL either performed or assessed with clinical judgement   ADL Overall ADL's : Needs assistance/impaired Eating/Feeding: Set up;Sitting   Grooming: Sitting;Brushing hair;Wash/dry face;Min guard Grooming Details (indicate cue type and reason): in recliner Upper Body Bathing: Moderate assistance   Lower Body Bathing: Maximal assistance   Upper Body Dressing : Moderate assistance   Lower Body Dressing: Maximal assistance Lower Body Dressing Details (indicate cue type and reason): to don socks Toilet Transfer: +2 for physical assistance;+2 for safety/equipment;BSC;Moderate assistance(use of STEDY)   Toileting- Clothing Manipulation and Hygiene: Maximal assistance;+2 for physical assistance;+2 for safety/equipment;Sit to/from stand       Functional mobility during ADLs: (use of Stedy) General ADL Comments: Pt with decreased access to LB for ADL, concerned about bump in throat     Vision         Perception     Praxis      Pertinent Vitals/Pain Pain Assessment: Faces Faces Pain Scale: Hurts even more Pain Location: Neck, Bil Knees Pain Descriptors / Indicators: Moaning;Discomfort;Grimacing;Sore Pain Intervention(s): Monitored during session;Repositioned     Hand Dominance Right   Extremity/Trunk Assessment Upper Extremity Assessment Upper Extremity Assessment: Generalized weakness   Lower Extremity Assessment Lower Extremity Assessment: Generalized weakness   Cervical / Trunk Assessment Cervical /  Trunk Assessment: Kyphotic   Communication Communication Communication: HOH   Cognition Arousal/Alertness: Awake/alert Behavior During Therapy: WFL for tasks assessed/performed Overall Cognitive Status: Impaired/Different from baseline Area  of Impairment: Memory;Following commands;Safety/judgement;Awareness;Problem solving                     Memory: Decreased short-term memory Following Commands: Follows one step commands with increased time;Follows one step commands consistently Safety/Judgement: Decreased awareness of safety;Decreased awareness of deficits Awareness: Emergent Problem Solving: Slow processing;Decreased initiation;Difficulty sequencing;Requires verbal cues;Requires tactile cues General Comments: agreeable to SNF   General Comments  VSS throughout, Pt concerned about lump in throat    Exercises     Shoulder Instructions      Home Living Family/patient expects to be discharged to:: Private residence Living Arrangements: Alone Available Help at Discharge: Family;Available PRN/intermittently(son and daughter in law live next door) Type of Home: House Home Access: Stairs to enter [employer] of Steps: 6 Entrance Stairs-Rails: Left;Right;Can reach both Home Layout: One level     Bathroom Shower/Tub: Occupational psychologist: Standard     Home Equipment: Environmental consultant - 2 wheels;Cane - single point          Prior Functioning/Environment Level of Independence: Independent        Comments: Pt recently discharged from hospital - OT/PT recommending SNF - she declined and got weaker and weaker - she fell and was on the ground several days. So she did need help after going home last time from the hospital        OT Problem List: Decreased strength;Decreased range of motion;Decreased activity tolerance;Impaired balance (sitting and/or standing);Decreased cognition;Decreased safety awareness;Decreased knowledge of use of DME or AE;Obesity;Pain      OT Treatment/Interventions: Self-care/ADL training;DME and/or AE instruction;Therapeutic activities;Cognitive remediation/compensation;Patient/family education;Balance training    OT Goals(Current goals can be found in the care plan  section) Acute Rehab OT Goals Patient Stated Goal: "make my neck feel better" OT Goal Formulation: With patient Time For Goal Achievement: 09/22/18 Potential to Achieve Goals: Good ADL Goals Pt Will Perform Grooming: with modified independence;sitting Pt Will Perform Upper Body Dressing: with supervision;sitting Pt Will Perform Lower Body Dressing: with min assist;sit to/from stand Pt Will Transfer to Toilet: with mod assist;ambulating Pt Will Perform Toileting - Clothing Manipulation and hygiene: with mod assist;sit to/from stand Pt/caregiver will Perform Home Exercise Program: Both right and left upper extremity;With theraband;With written HEP provided Additional ADL Goal #1: Pt will perform bed mobility at min A prior to engaging in ADL  OT Frequency: Min 2X/week   Barriers to D/C: Decreased caregiver support;Inaccessible home environment          Co-evaluation PT/OT/SLP Co-Evaluation/Treatment: Yes Reason for Co-Treatment: For patient/therapist safety;To address functional/ADL transfers PT goals addressed during session: Mobility/safety with mobility;Balance;Proper use of DME OT goals addressed during session: Proper use of Adaptive equipment and DME;ADL's and self-care      AM-PAC OT "6 Clicks" Daily Activity     Outcome Measure Help from another person eating meals?: A Little Help from another person taking care of personal grooming?: A Little Help from another person toileting, which includes using toliet, bedpan, or urinal?: A Lot Help from another person bathing (including washing, rinsing, drying)?: A Lot Help from another person to put on and taking off regular upper body clothing?: A Lot Help from another person to put on and taking off regular lower body clothing?: A Lot 6 Click Score: 14   End of Session Equipment Utilized  During Treatment: Gait belt Nurse Communication: Mobility status;Need for lift equipment  Activity Tolerance: Patient tolerated treatment  well Patient left: with call bell/phone within reach;in chair  OT Visit Diagnosis: Unsteadiness on feet (R26.81);Other abnormalities of gait and mobility (R26.89);Repeated falls (R29.6);Muscle weakness (generalized) (M62.81);Other symptoms and signs involving cognitive function;History of falling (Z91.81);Pain Pain - Right/Left: (bilateral L>R) Pain - part of body: Knee(neck)                Time: 5366-4403 OT Time Calculation (min): 28 min Charges:  OT General Charges $OT Visit: 1 Visit OT Evaluation $OT Eval Moderate Complexity: [place]anna OTR/L Acute Rehabilitation Services Pager: [phone] Office: [place] 09/08/2018, 3:08 PM

## 2018-09-08 NOTE — NC FL2 (Signed)
[place] LEVEL OF CARE SCREENING TOOL     IDENTIFICATION  Patient Name: Teresa Peck Birthdate: 1940-05-23 Sex: female Admission Date (Current Location): 09/07/2018  [facility] and [place] Number:  Herbalist and Address:  [facility] [place], [place] [address] [place], [place] [address]      Provider Number: [id number]  Attending Physician Name and Address:  Kathie Dike, MD  Relative Name and Phone Number:  Bee Marchiano; daughter in law; [phone]    Current Level of Care: Hospital Recommended Level of Care: [place] Prior Approval Number:    Date Approved/Denied:   PASRR Number: [id number]  Discharge Plan: SNF    Current Diagnoses: Patient Active Problem List   Diagnosis Date Noted  . Acute lower UTI 09/07/2018  . GERD (gastroesophageal reflux disease) 09/07/2018  . Weakness generalized 09/07/2018  . Pressure injury of skin 09/07/2018  . Chronic atrial fibrillation 10/30/2016  . Lumbar stenosis with neurogenic claudication 04/13/2015  . Chronic systolic CHF (congestive heart failure) ([place]) 10/19/2014  . Type 2 diabetes mellitus with complication, without long-term current use of insulin ([place]) 11/08/2009  . Hyperlipidemia 11/08/2009  . CAD (coronary artery disease) 11/08/2009  . CARDIOMYOPATHY, ISCHEMIC 11/08/2009    Orientation RESPIRATION BLADDER Height & Weight     Time, Situation, Place, Self  Normal Continent Weight: 178 lb 5.6 oz (80.9 kg) Height:  5\' 4"  (162.6 cm)  BEHAVIORAL SYMPTOMS/MOOD NEUROLOGICAL BOWEL NUTRITION STATUS      Continent Diet(see discharge summary)  AMBULATORY STATUS COMMUNICATION OF NEEDS Skin   Extensive Assist Verbally PU Stage and Appropriate Care, Other (Comment)(stage 2 on sacrum with meliplex dressing; black scab on right foot; purple lesions on left foot)   PU Stage 2 Dressing: (meliplex on sacrum)                   Personal Care Assistance Level of  Assistance  Feeding, Bathing, Dressing Bathing Assistance: Maximum assistance Feeding assistance: Independent Dressing Assistance: Maximum assistance     Functional Limitations Info  Sight, Hearing, Speech Sight Info: Adequate Hearing Info: Adequate Speech Info: Adequate    SPECIAL CARE FACTORS FREQUENCY  PT (By licensed PT), OT (By licensed OT)     PT Frequency: 5x week OT Frequency: 5x week            Contractures Contractures Info: Not present    Additional Factors Info  Code Status, Allergies, Insulin Sliding Scale Code Status Info: Full Code Allergies Info: Sulfur   Insulin Sliding Scale Info: insulin aspart (novoLOG) injection 0-5 Units daily at bedtime; insulin aspart (novoLOG) injection 0-9 Units 3x daily with meals; insulin glargine (LANTUS) injection 10 Units daily       Current Medications (09/08/2018):  This is the current hospital active medication list Current Facility-Administered Medications  Medication Dose Route Frequency Provider Last Rate Last Dose  . acetaminophen (TYLENOL) tablet 650 mg  650 mg Oral Q6H PRN Vertis Kelch, NP   650 mg at 09/08/18 0602  . allopurinol (ZYLOPRIM) tablet 300 mg  300 mg Oral Daily Gala Romney L, MD   300 mg at 09/08/18 0935  . apixaban (ELIQUIS) tablet 5 mg  5 mg Oral BID Elwyn Reach, MD   5 mg at 09/08/18 0935  . cefTRIAXone (ROCEPHIN) 1 g in sodium chloride 0.9 % 100 mL IVPB  1 g Intravenous QHS Gala Romney L, MD 200 mL/hr at 09/07/18 2356 1 g at 09/07/18 2356  . cycloSPORINE (  RESTASIS) 0.05 % ophthalmic emulsion 1 drop  1 drop Both Eyes BID Elwyn Reach, MD   1 drop at 09/08/18 0937  . famotidine (PEPCID) tablet 20 mg  20 mg Oral BID Gala Romney L, MD   20 mg at 09/08/18 0936  . furosemide (LASIX) tablet 40 mg  40 mg Oral Ervin Knack, MD   40 mg at 09/08/18 0935  . gabapentin (NEURONTIN) tablet 600 mg  600 mg Oral BID Gala Romney L, MD   600 mg at 09/08/18 0935  . insulin  aspart (novoLOG) injection 0-5 Units  0-5 Units Subcutaneous QHS Garba, Mohammad L, MD      . insulin aspart (novoLOG) injection 0-9 Units  0-9 Units Subcutaneous TID WC Elwyn Reach, MD   2 Units at 09/08/18 1243  . insulin glargine (LANTUS) injection 10 Units  10 Units Subcutaneous Daily Elwyn Reach, MD   10 Units at 09/08/18 [phone]  . magnesium oxide (MAG-OX) tablet 400 mg  400 mg Oral Daily Elwyn Reach, MD   400 mg at 09/08/18 0936  . metoprolol succinate (TOPROL-XL) 24 hr tablet 25 mg  25 mg Oral Daily Gala Romney L, MD   25 mg at 09/08/18 0936  . nitroGLYCERIN (NITROSTAT) SL tablet 0.4 mg  0.4 mg Sublingual Q5 min PRN Gala Romney L, MD      . ondansetron (ZOFRAN) tablet 4 mg  4 mg Oral Q6H PRN Elwyn Reach, MD       Or  . ondansetron (ZOFRAN) injection 4 mg  4 mg Intravenous Q6H PRN Gala Romney L, MD      . rosuvastatin (CRESTOR) tablet 10 mg  10 mg Oral Daily Elwyn Reach, MD   10 mg at 09/08/18 0935  . sodium chloride flush (NS) 0.9 % injection 10-40 mL  10-40 mL Intracatheter PRN Gala Romney L, MD      . sodium phosphate (FLEET) 7-19 GM/118ML enema 1 enema  1 enema Rectal Once Kathie Dike, MD      . traMADol Veatrice Bourbon) tablet 50 mg  50 mg Oral Q12H PRN Bodenheimer, Clenton Pare, NP         Discharge Medications: Please see discharge summary for a list of discharge medications.  Relevant Imaging Results:  Relevant Lab Results:   Additional Information [id number] [place], [place]

## 2018-09-08 NOTE — Evaluation (Signed)
Physical Therapy Evaluation Patient Details Name: Teresa Peck MRN: [id number] DOB: Apr 20, 1940 Today's Date: 09/08/2018   History of Present Illness  78 year old with PMH including hypertension, hyperlipidemia, Dm2 w neuropathy, CAD , CHF , PAD, Anemia Patient discharged from here recently, went home and was unable to care for herself or get out of chair.  Clinical Impression  Patient received in bed. She agrees to PT evaluation. Asks that I feel knot in throat from prior fall. Patient reports knees cracking with mobility. Reports weakness upon sitting up at edge of bed. Patient required +2 mod assist with bed mobility and transfer this visit. Requires stedy lift for transfer from bed to recliner. Patient will benefit from continued skilled PT to address her weakness, difficulty with transfers and ambulation to reduce fall risk and increase independence.         Follow Up Recommendations SNF    Equipment Recommendations  None recommended by PT    Recommendations for Other Services       Precautions / Restrictions Precautions Precautions: Fall Restrictions Weight Bearing Restrictions: No      Mobility  Bed Mobility Overal bed mobility: Needs Assistance Bed Mobility: Supine to Sit     Supine to sit: Mod assist;HOB elevated;+2 for safety/equipment     General bed mobility comments: multimodal cues for sequencing, use of bed pad to assist with hips  Transfers Overall transfer level: Needs assistance Equipment used: 2 person hand held assist Transfers: Sit to/from Stand Sit to Stand: +2 physical assistance;+2 safety/equipment;From elevated surface;Mod assist         General transfer comment: Pt able to pull up on stedy and achieve standing with mod A of 2  Ambulation/Gait                Stairs            Wheelchair Mobility    Modified Rankin (Stroke Patients Only)       Balance Overall balance assessment: Needs assistance Sitting-balance  support: Bilateral upper extremity supported Sitting balance-Leahy Scale: Fair     Standing balance support: Bilateral upper extremity supported Standing balance-Leahy Scale: Poor Standing balance comment: dependent on external support from therapists and gait belt essential                             Pertinent Vitals/Pain Pain Assessment: Faces Faces Pain Scale: Hurts even more Pain Location: Neck, Bil Knees Pain Descriptors / Indicators: Moaning;Discomfort;Grimacing;Sore Pain Intervention(s): Limited activity within patient's tolerance;Monitored during session;Repositioned    Home Living Family/patient expects to be discharged to:: Private residence Living Arrangements: Alone Available Help at Discharge: Family;Available PRN/intermittently Type of Home: House Home Access: Stairs to enter Entrance Stairs-Rails: Right;Left;Can reach both Entrance Stairs-Number of Steps: 6 Home Layout: One level Home Equipment: Walker - 2 wheels;Cane - single point      Prior Function Level of Independence: Independent         Comments: Pt recently discharged from hospital - OT/PT recommending SNF - she declined and got weaker and weaker - she fell and was on the ground several days. So she did need help after going home last time from the hospital     Hand Dominance   Dominant Hand: Right    Extremity/Trunk Assessment   Upper Extremity Assessment Upper Extremity Assessment: Generalized weakness    Lower Extremity Assessment Lower Extremity Assessment: Generalized weakness    Cervical / Trunk Assessment Cervical / Trunk Assessment:  Kyphotic  Communication   Communication: HOH  Cognition Arousal/Alertness: Awake/alert Behavior During Therapy: WFL for tasks assessed/performed Overall Cognitive Status: Impaired/Different from baseline Area of Impairment: Memory;Following commands;Safety/judgement;Awareness;Problem solving                 Orientation Level:  Disoriented to;Time;Situation   Memory: Decreased short-term memory Following Commands: Follows one step commands with increased time;Follows one step commands consistently Safety/Judgement: Decreased awareness of safety;Decreased awareness of deficits Awareness: Emergent Problem Solving: Slow processing;Decreased initiation;Difficulty sequencing;Requires verbal cues;Requires tactile cues General Comments: agreeable to SNF      General Comments General comments (skin integrity, edema, etc.): VSS throughout, Pt concerned about lump in throat    Exercises     Assessment/Plan    PT Assessment Patient needs continued PT services  PT Problem List Decreased strength;Decreased mobility;Decreased activity tolerance;Decreased balance;Pain;Decreased safety awareness       PT Treatment Interventions DME instruction;Therapeutic activities;Gait training;Therapeutic exercise;Patient/family education;Functional mobility training;Balance training    PT Goals (Current goals can be found in the Care Plan section)  Acute Rehab PT Goals Patient Stated Goal: to go to rehab PT Goal Formulation: With patient Time For Goal Achievement: 09/19/18 Potential to Achieve Goals: Good    Frequency Min 3X/week   Barriers to discharge Decreased caregiver support      Co-evaluation PT/OT/SLP Co-Evaluation/Treatment: Yes Reason for Co-Treatment: To address functional/ADL transfers;For patient/therapist safety PT goals addressed during session: Mobility/safety with mobility;Proper use of DME;Balance OT goals addressed during session: Proper use of Adaptive equipment and DME;ADL's and self-care       AM-PAC PT "6 Clicks" Mobility  Outcome Measure Help needed turning from your back to your side while in a flat bed without using bedrails?: A Lot Help needed moving from lying on your back to sitting on the side of a flat bed without using bedrails?: A Lot Help needed moving to and from a bed to a chair  (including a wheelchair)?: A Lot Help needed standing up from a chair using your arms (e.g., wheelchair or bedside chair)?: A Lot Help needed to walk in hospital room?: Total Help needed climbing 3-5 steps with a railing? : Total 6 Click Score: 10    End of Session Equipment Utilized During Treatment: Gait belt Activity Tolerance: Patient tolerated treatment well;Patient limited by pain;Patient limited by fatigue Patient left: in chair;with call bell/phone within reach Nurse Communication: Mobility status PT Visit Diagnosis: Unsteadiness on feet (R26.81);Muscle weakness (generalized) (M62.81);Difficulty in walking, not elsewhere classified (R26.2);Pain;History of falling (Z91.81) Pain - Right/Left: (right and left) Pain - part of body: Knee    Time: 5102-5852 PT Time Calculation (min) (ACUTE ONLY): 23 min   Charges:   PT Evaluation $PT Eval Low Complexity: 1 Low PT Treatments $Therapeutic Activity: 8-22 mins        Waller Marcussen, PT, GCS 09/08/18,3:32 PM

## 2018-09-08 NOTE — TOC Progression Note (Signed)
Transition of Care [facility]) - Progression Note    Patient Details  Name: HEBA IGE MRN: [id number] Date of Birth: May 24, 1940  [facility] CM/SW Contact  Jacalyn Lefevre Edson Snowball, RN Phone Number: 09/08/2018, 1:34 PM  Clinical Narrative:     Damaris Schooner to patient's daughter Caren Griffins with patient's permission.   Patient from home with daughter and [place]. Patient recently discharged and daughter did not realize how weak her mother was. Caren Griffins in agreement for her mother to go to SNF for short term rehab. Explained her mother gave permission to look in [facility] and [employer]. Once we obtain offers we will email offers to [place] at [email or web address] and she can discuss offers with her mother. Also, explained awaiting note from PT and will need insurance authorization for SNF.   Caren Griffins voiced understanding.   Butch Penny with [facility] aware of patient's admission and room room.  Expected Discharge Plan: [place] Barriers to Discharge: Continued Medical Work up, Ship broker  Expected Discharge Plan and Services Expected Discharge Plan: [place] In-house Referral: Clinical Social Work Discharge Planning Services: CM Consult Post Acute Care Choice: [place] arrangements for the past 2 months: Single Family Home                                       Social Determinants of Health (SDOH) Interventions    Readmission Risk Interventions No flowsheet data found.

## 2018-09-08 NOTE — Progress Notes (Signed)
PROGRESS NOTE    KATHERINA WIMER  STM:[id number] DOB: 1940/05/11 DOA: 09/07/2018 PCP: Deland Pretty, MD    Brief Narrative:  78 year old female with a history of chronic systolic congestive heart failure, ejection fraction 30 to 35%, diabetes, hypertension, was recently discharged from the hospital after being treated for metabolic encephalopathy.  At that time, she was seen by physical therapy and recommended skilled nursing facility placement.  Patient family elected to take the patient home with home health services.  Within a few days, patient returned to the hospital after recurrent falls and generalized weakness.  Family was unable to provide a safe environment for the patient at home.  Urinalysis indicated possible infection.  She was started on antibiotics.  Seen by physical therapy with recommendations for skilled nursing facility placement.   Assessment & Plan:   Principal Problem:   Weakness generalized Active Problems:   Type 2 diabetes mellitus with complication, without long-term current use of insulin (HCC)   Hyperlipidemia   CAD (coronary artery disease)   Chronic systolic CHF (congestive heart failure) (HCC)   Chronic atrial fibrillation   Acute lower UTI   GERD (gastroesophageal reflux disease)   Pressure injury of skin   1. Generalized weakness with repeated falls.  Likely secondary to deconditioning, possibly related to UTI.  On her last admission, she was recommended to go to skilled nursing facility, but patient family elected to go home.  Since returning home, patient reports continued falls.  She clearly is not safe to return home at this time and should stay in the hospital until a safe discharge plan is made.  She is now agreeable to go to skilled nursing facility for physical therapy. 2. Chronic atrial fibrillation.  Heart rate is controlled.  She is anticoagulated with apixaban. 3. Urinary tract infection.  Urinalysis indicates possible infection.  She is on  Rocephin.  Follow-up culture. 4. Hypertension.  Blood pressures currently stable.  Continue metoprolol. 5. GERD.  Continue Pepcid 6. Chronic systolic congestive heart failure.  EF of 30 to 35%.  Currently appears compensated.  Continue home dose of Lasix. 7. Diabetes.  On Lantus and sliding scale insulin.  Blood sugars are currently stable.  Continue current treatments 8. Coronary artery disease.  No complaints of chest pain. 9. Constipation.  Improved after receiving enema.   DVT prophylaxis: Apixaban Code Status: Full code Family Communication: None Disposition Plan: Skilled nursing facility   Consultants:     Procedures:     Antimicrobials:   Ceftriaxone 7/20>   Subjective: Complains of constipation with pressure in her rectum.  Feels generally weak.  Objective: Vitals:   09/07/18 2251 09/07/18 2320 09/08/18 0427 09/08/18 1414  BP: (!) 116/57 132/83 129/80 121/90  Pulse: (!) 57 (!) 59 61 81  Resp: 12 18  18   Temp:  97.6 F (36.4 C) 97.9 F (36.6 C) 97.6 F (36.4 C)  TempSrc:  Oral Oral Oral  SpO2: 98% 99% 97% 99%  Weight:      Height:        Intake/Output Summary (Last 24 hours) at 09/08/2018 1712 Last data filed at 09/08/2018 1218 Gross per 24 hour  Intake -  Output 1500 ml  Net -1500 ml   Filed Weights   09/07/18 1542  Weight: 80.9 kg    Examination:  General exam: Appears calm and comfortable  HEENT: Bruising around her neck Respiratory system: Clear to auscultation. Respiratory effort normal. Cardiovascular system: S1 & S2 heard, RRR. No JVD, murmurs, rubs, gallops  or clicks. No pedal edema. Gastrointestinal system: Abdomen is nondistended, soft and nontender. No organomegaly or masses felt. Normal bowel sounds heard. Central nervous system: Alert and oriented. No focal neurological deficits. Extremities: Symmetric 5 x 5 power. Skin: No rashes, lesions or ulcers Psychiatry: Judgement and insight appear normal. Mood & affect appropriate.      Data Reviewed: I have personally reviewed following labs and imaging studies  CBC: Recent Labs  Lab 09/02/18 1431 09/03/18 0516 09/04/18 0826 09/07/18 1607 09/08/18 0727  WBC 13.2* 10.8* 8.3 8.4 9.1  NEUTROABS  --   --   --  6.1  --   HGB 13.1 12.9 13.2 13.0 13.6  HCT 39.5 39.8 39.2 39.0 40.5  MCV 98.0 99.5 94.7 95.1 94.2  PLT 159 135* 135* 175 563   Basic Metabolic Panel: Recent Labs  Lab 09/02/18 0648  09/03/18 0516 09/04/18 0317 09/05/18 0228 09/07/18 1607 09/08/18 0727  NA 137   < > 139 134* 133* 136 140  K 3.9   < > 4.3 3.9 3.6 3.3* 3.8  CL 103   < > 108 103 98 100 104  CO2 21*   < > 19* 19* 25 25 25   GLUCOSE 226*   < > 120* 153* 142* 229* 177*  BUN 70*   < > 62* 41* 28* 21 16  CREATININE 1.69*   < > 1.51* 1.24* 0.98 0.92 0.82  CALCIUM 8.9   < > 8.5* 8.5* 8.5* 8.6* 8.4*  MG 2.2  --  2.1  --   --   --   --   PHOS  --   --  2.9  --   --   --   --    < > = values in this interval not displayed.   GFR: Estimated Creatinine Clearance: 56.3 mL/min (by C-G formula based on SCr of 0.82 mg/dL). Liver Function Tests: Recent Labs  Lab 09/02/18 1431 09/03/18 0516 09/04/18 0317 09/05/18 0228 09/08/18 0727  AST 1,134* 474* 160* 99* 48*  ALT 584* 381* 220* 161* 87*  ALKPHOS 154* 145* 167* 218* 199*  BILITOT 3.6* 3.7* 3.1* 1.6* 1.1  PROT 5.3* 5.4* 4.8* 4.9* 5.5*  ALBUMIN 2.3* 2.1* 1.9* 1.9* 2.3*   No results for input(s): LIPASE, AMYLASE in the last 168 hours. No results for input(s): AMMONIA in the last 168 hours. Coagulation Profile: Recent Labs  Lab 09/02/18 1431 09/03/18 0516  INR 2.5* 2.6*   Cardiac Enzymes: Recent Labs  Lab 09/02/18 0648 09/03/18 0516  CKTOTAL 824* 350*  CKMB 13.5*  --    BNP (last 3 results) No results for input(s): PROBNP in the last 8760 hours. HbA1C: No results for input(s): HGBA1C in the last 72 hours. CBG: Recent Labs  Lab 09/05/18 1150 09/08/18 0256 09/08/18 0746 09/08/18 1143 09/08/18 1642  GLUCAP 213* 196* 159*  177* 123*   Lipid Profile: No results for input(s): CHOL, HDL, LDLCALC, TRIG, CHOLHDL, LDLDIRECT in the last 72 hours. Thyroid Function Tests: No results for input(s): TSH, T4TOTAL, FREET4, T3FREE, THYROIDAB in the last 72 hours. Anemia Panel: No results for input(s): VITAMINB12, FOLATE, FERRITIN, TIBC, IRON, RETICCTPCT in the last 72 hours. Sepsis Labs: Recent Labs  Lab 09/02/18 1431  LATICACIDVEN 1.2    Recent Results (from the past 240 hour(s))  SARS Coronavirus 2 (CEPHEID - Performed in [facility] hospital lab), Hosp Order     Status: None   Collection Time: 09/01/18  2:00 AM   Specimen: Nasopharyngeal Swab  Result Value  Ref Range Status   SARS Coronavirus 2 NEGATIVE NEGATIVE Final    Comment: (NOTE) If result is NEGATIVE SARS-CoV-2 target nucleic acids are NOT DETECTED. The SARS-CoV-2 RNA is generally detectable in upper and lower  respiratory specimens during the acute phase of infection. The lowest  concentration of SARS-CoV-2 viral copies this assay can detect is 250  copies / mL. A negative result does not preclude SARS-CoV-2 infection  and should not be used as the sole basis for treatment or other  patient management decisions.  A negative result may occur with  improper specimen collection / handling, submission of specimen other  than nasopharyngeal swab, presence of viral mutation(s) within the  areas targeted by this assay, and inadequate number of viral copies  (<250 copies / mL). A negative result must be combined with clinical  observations, patient history, and epidemiological information. If result is POSITIVE SARS-CoV-2 target nucleic acids are DETECTED. The SARS-CoV-2 RNA is generally detectable in upper and lower  respiratory specimens dur ing the acute phase of infection.  Positive  results are indicative of active infection with SARS-CoV-2.  Clinical  correlation with patient history and other diagnostic information is  necessary to determine  patient infection status.  Positive results do  not rule out bacterial infection or co-infection with other viruses. If result is PRESUMPTIVE POSTIVE SARS-CoV-2 nucleic acids MAY BE PRESENT.   A presumptive positive result was obtained on the submitted specimen  and confirmed on repeat testing.  While 2019 novel coronavirus  (SARS-CoV-2) nucleic acids may be present in the submitted sample  additional confirmatory testing may be necessary for epidemiological  and / or clinical management purposes  to differentiate between  SARS-CoV-2 and other Sarbecovirus currently known to infect humans.  If clinically indicated additional testing with an alternate test  methodology [phone]) is advised. The SARS-CoV-2 RNA is generally  detectable in upper and lower respiratory sp ecimens during the acute  phase of infection. The expected result is Negative. Fact Sheet for Patients:  StrictlyIdeas.no Fact Sheet for Healthcare Providers: BankingDealers.co.za This test is not yet approved or cleared by the [place] FDA and has been authorized for detection and/or diagnosis of SARS-CoV-2 by FDA under an Emergency Use Authorization (EUA).  This EUA will remain in effect (meaning this test can be used) for the duration of the COVID-19 declaration under Section 564(b)(1) of the Act, 21 U.S.C. section 360bbb-3(b)(1), unless the authorization is terminated or revoked sooner. Performed at [facility], [place] [address] [place], [place] [address]   MRSA PCR Screening     Status: None   Collection Time: 09/02/18 11:28 AM   Specimen: Nasal Mucosa; Nasopharyngeal  Result Value Ref Range Status   MRSA by PCR NEGATIVE NEGATIVE Final    Comment:        The GeneXpert MRSA Assay (FDA approved for NASAL specimens only), is one component of a comprehensive MRSA colonization surveillance program. It is not intended to diagnose MRSA infection nor to guide  or monitor treatment for MRSA infections. Performed at [facility], [place] [address] [place], [place] [address]   SARS Coronavirus 2 (CEPHEID - Performed in [facility] hospital lab), Hosp Order     Status: None   Collection Time: 09/07/18  9:32 PM   Specimen: Nasopharyngeal Swab  Result Value Ref Range Status   SARS Coronavirus 2 NEGATIVE NEGATIVE Final    Comment: (NOTE) If result is NEGATIVE SARS-CoV-2 target nucleic acids are NOT DETECTED. The  SARS-CoV-2 RNA is generally detectable in upper and lower  respiratory specimens during the acute phase of infection. The lowest  concentration of SARS-CoV-2 viral copies this assay can detect is 250  copies / mL. A negative result does not preclude SARS-CoV-2 infection  and should not be used as the sole basis for treatment or other  patient management decisions.  A negative result may occur with  improper specimen collection / handling, submission of specimen other  than nasopharyngeal swab, presence of viral mutation(s) within the  areas targeted by this assay, and inadequate number of viral copies  (<250 copies / mL). A negative result must be combined with clinical  observations, patient history, and epidemiological information. If result is POSITIVE SARS-CoV-2 target nucleic acids are DETECTED. The SARS-CoV-2 RNA is generally detectable in upper and lower  respiratory specimens dur ing the acute phase of infection.  Positive  results are indicative of active infection with SARS-CoV-2.  Clinical  correlation with patient history and other diagnostic information is  necessary to determine patient infection status.  Positive results do  not rule out bacterial infection or co-infection with other viruses. If result is PRESUMPTIVE POSTIVE SARS-CoV-2 nucleic acids MAY BE PRESENT.   A presumptive positive result was obtained on the submitted specimen  and confirmed on repeat testing.  While 2019 novel coronavirus  (SARS-CoV-2)  nucleic acids may be present in the submitted sample  additional confirmatory testing may be necessary for epidemiological  and / or clinical management purposes  to differentiate between  SARS-CoV-2 and other Sarbecovirus currently known to infect humans.  If clinically indicated additional testing with an alternate test  methodology [phone]) is advised. The SARS-CoV-2 RNA is generally  detectable in upper and lower respiratory sp ecimens during the acute  phase of infection. The expected result is Negative. Fact Sheet for Patients:  StrictlyIdeas.no Fact Sheet for Healthcare Providers: BankingDealers.co.za This test is not yet approved or cleared by the [place] FDA and has been authorized for detection and/or diagnosis of SARS-CoV-2 by FDA under an Emergency Use Authorization (EUA).  This EUA will remain in effect (meaning this test can be used) for the duration of the COVID-19 declaration under Section 564(b)(1) of the Act, 21 U.S.C. section 360bbb-3(b)(1), unless the authorization is terminated or revoked sooner. Performed at [facility], [place] [address], [place], [place] [address]          Radiology Studies: No results found.      Scheduled Meds: . allopurinol  300 mg Oral Daily  . apixaban  5 mg Oral BID  . cycloSPORINE  1 drop Both Eyes BID  . famotidine  20 mg Oral BID  . furosemide  40 mg Oral QODAY  . gabapentin  600 mg Oral BID  . insulin aspart  0-5 Units Subcutaneous QHS  . insulin aspart  0-9 Units Subcutaneous TID WC  . insulin glargine  10 Units Subcutaneous Daily  . magnesium oxide  400 mg Oral Daily  . metoprolol succinate  25 mg Oral Daily  . rosuvastatin  10 mg Oral Daily   Continuous Infusions: . cefTRIAXone (ROCEPHIN)  IV 1 g (09/07/18 2356)     LOS: 1 day    Time spent: 27mins    Kathie Dike, MD [facility]   If 7PM-7AM, please contact night-coverage  [email or web address]  09/08/2018, 5:12 PM

## 2018-09-09 LAB — GLUCOSE, CAPILLARY
Glucose-Capillary: 170 mg/dL — ABNORMAL HIGH (ref 70–99)
Glucose-Capillary: 193 mg/dL — ABNORMAL HIGH (ref 70–99)
Glucose-Capillary: 193 mg/dL — ABNORMAL HIGH (ref 70–99)
Glucose-Capillary: 203 mg/dL — ABNORMAL HIGH (ref 70–99)

## 2018-09-09 MED ORDER — ACETAMINOPHEN 325 MG PO TABS: 650.0000 mg | ORAL_TABLET | Freq: Four times a day (QID) | ORAL | Status: AC | PRN

## 2018-09-09 NOTE — TOC Progression Note (Addendum)
Transition of Care [facility]) - Progression Note    Patient Details  Name: Teresa Peck MRN: [id number] Date of Birth: 13-Jan-1941  Transition of Care [facility]) CM/SW [place], [place] Phone Number: 09/09/2018, 11:10 AM   Clinical Narrative:    3:05pm- Spoke with pt daughter in law and they have chosen [facility] is reviewing referral for insurance auth with Forensic psychologist.  11:10am- Aware of discharge summary, pt does not have a placement choice at this time. When pt family has made decision (I discussed with daughter that we need a choice today to start insurance authorization), then we will start insurance authorization. New COVID test requested to be ordered for discharge also.   Offers sent to daughter, pt given CMS ratings also.  Pt wants family to make decision. Await decision.   Expected Discharge Plan: [place] Barriers to Discharge: Continued Medical Work up, Ship broker  Expected Discharge Plan and Services Expected Discharge Plan: [place] In-house Referral: Clinical Social Work Discharge Planning Services: CM Consult Post Acute Care Choice: [place] arrangements for the past 2 months: Single Family Home  Social Determinants of Health (SDOH) Interventions    Readmission Risk Interventions No flowsheet data found.

## 2018-09-09 NOTE — TOC Progression Note (Signed)
Transition of Care [facility]) - Progression Note    Patient Details  Name: Teresa Peck MRN: [id number] Date of Birth: Jul 25, 1940  Transition of Care [facility]) CM/SW [place], [place] Phone Number: 09/09/2018, 10:17 AM  Clinical Narrative:    Spoke with pt daughter in law Teresa Peck as requested by pt  Pt has three offers, confirmed that I email them to pt daughter and would also bring pt a copy of these offers. We discussed Medicare coverage and discharge planning needs. Pt daughter requested we f/u with [facility] as well to assess any bed availability.    Expected Discharge Plan: [place] Barriers to Discharge: Continued Medical Work up, Ship broker  Expected Discharge Plan and Services Expected Discharge Plan: [place] In-house Referral: Clinical Social Work Discharge Planning Services: CM Consult Post Acute Care Choice: [place] arrangements for the past 2 months: Single Family Home   Social Determinants of Health (SDOH) Interventions    Readmission Risk Interventions No flowsheet data found.

## 2018-09-09 NOTE — Discharge Summary (Addendum)
Physician Discharge Summary  Teresa Peck CZY:[id number] DOB: 1940/12/01 DOA: 09/07/2018  PCP: Deland Pretty, MD  Admit date: 09/07/2018 Discharge date: 09/09/2018  Admitted From: home Disposition:  SNF  Recommendations for Outpatient Follow-up:  Follow up with PCP in 1-2 weeks Please obtain BMP/CBC in one week  Discharge Condition:stable CODE STATUS:full code Diet recommendation: heart healthy, carb modified  Brief/Interim Summary: 78 year old female with a history of chronic systolic congestive heart failure, ejection fraction 30 to 35%, diabetes, hypertension, was recently discharged from the hospital after being treated for metabolic encephalopathy.  At that time, she was seen by physical therapy and recommended skilled nursing facility placement.  Patient family elected to take the patient home with home health services.  Within a few days, patient returned to the hospital after recurrent falls and generalized weakness.  Family was unable to provide a safe environment for the patient at home.  Urinalysis indicated possible infection.  She was started on antibiotics.  Seen by physical therapy with recommendations for skilled nursing facility placement.  Discharge Diagnoses:  Principal Problem:   Weakness generalized Active Problems:   Type 2 diabetes mellitus with complication, without long-term current use of insulin (HCC)   Hyperlipidemia   CAD (coronary artery disease)   Chronic systolic CHF (congestive heart failure) (HCC)   Chronic atrial fibrillation   Acute lower UTI   GERD (gastroesophageal reflux disease)   Pressure injury of skin  Generalized weakness with repeated falls.  Likely secondary to deconditioning.  On her last admission, she was recommended to go to skilled nursing facility, but patient and family elected to go home.  Since returning home, patient reported continued falls.  She clearly is not safe to return home at this time and should stay in the hospital  until a safe discharge plan is made.  She is now agreeable to go to skilled nursing facility for physical therapy. Social work is assisting in bed placement Chronic atrial fibrillation.  Heart rate is controlled.  She is anticoagulated with apixaban. Possible Urinary tract infection.  Urinalysis indicated possible infection.  She was started on Rocephin.  Urine culture showed multiple organisms. She is afebrile and does not have any dysuria, antibiotics were discontinued. Hypertension.  Blood pressures currently stable.  Continue metoprolol. GERD.  Continue Pepcid Chronic systolic congestive heart failure.  EF of 30 to 35%.  Currently appears compensated.  Continue home dose of Lasix. Diabetes.  On Lantus and sliding scale insulin.  Blood sugars are currently stable.  Continue current treatments Coronary artery disease.  No complaints of chest pain. Constipation.  Improved after receiving enema. Pressure injuries: 1. Pressure injury to sacrum, stage 2, present on admission, 2. Pressure injury to right 5th toe, unstageable, present on admission, 3. Deep tissue injury, right great toe, present on admission, deep tissue injury, left great toe, present on admission. Continue supportive care.  Discharge Instructions    Allergies as of 09/09/2018       Reactions   Sulfur Rash   Rash/flushed, blood red eyes        Medication List     TAKE these medications    acetaminophen 325 MG tablet Commonly known as: TYLENOL Take 2 tablets (650 mg total) by mouth every 6 (six) hours as needed for mild pain or headache.   allopurinol 300 MG tablet Commonly known as: ZYLOPRIM Take 300 mg by mouth daily.   apixaban 5 MG Tabs tablet Commonly known as: ELIQUIS Take 1 tablet (5 mg total) by mouth 2 (  two) times daily.   furosemide 40 MG tablet Commonly known as: LASIX Take 40 mg by mouth every other day.   gabapentin 600 MG tablet Commonly known as: NEURONTIN Take 600 mg by mouth 2 (two) times  daily.   insulin glargine 100 unit/mL Sopn Commonly known as: LANTUS Inject 0.1 mLs (10 Units total) into the skin daily.   Magnesium Oxide 400 (240 Mg) MG Tabs Take 1 tablet (400 mg total) by mouth daily.   metFORMIN 500 MG tablet Commonly known as: GLUCOPHAGE Take 500 mg by mouth 2 (two) times daily with a meal.   metoprolol succinate 25 MG 24 hr tablet Commonly known as: TOPROL-XL Take 25 mg by mouth daily.   nitroGLYCERIN 0.4 MG SL tablet Commonly known as: NITROSTAT PLACE 1 TABLET UNDER THE TONGUE EVERY 5 MINUTES AS NEEDED FOR CHEST PAIN What changed: See the new instructions.   Restasis 0.05 % ophthalmic emulsion Generic drug: cycloSPORINE Place 1 drop into both eyes 2 (two) times daily.   rosuvastatin 10 MG tablet Commonly known as: CRESTOR Take 10 mg by mouth daily.   ZANTAC PO Take 1 tablet by mouth daily.       Follow-up Information     Deland Pretty, MD.   Specialty: Internal Medicine Contact information: [place] [address] [phone]           Allergies  Allergen Reactions   Sulfur Rash    Rash/flushed, blood red eyes    Consultations:    Procedures/Studies: Dg Wrist Complete Right  Result Date: 09/01/2018 CLINICAL DATA:  78 y/o  F; right wrist pain after fall. EXAM: RIGHT WRIST - COMPLETE 3+ VIEW COMPARISON:  None. FINDINGS: There is no evidence of fracture or dislocation. There is no evidence of arthropathy or other focal bone abnormality. Soft tissues are unremarkable. IMPRESSION: No acute fracture or dislocation identified. Electronically Signed   By: Kristine Garbe M.D.   On: 09/01/2018 00:57   Ct Head Wo Contrast  Result Date: 09/01/2018 CLINICAL DATA:  Today. Large forehead swelling. EXAM: CT HEAD WITHOUT CONTRAST CT CERVICAL SPINE WITHOUT CONTRAST TECHNIQUE: Multidetector CT imaging of the head and cervical spine was performed following the standard protocol without intravenous  contrast. Multiplanar CT image reconstructions of the cervical spine were also generated. COMPARISON:  Head CT without contrast 06/18/2011 FINDINGS: CT HEAD FINDINGS Brain: Cerebral volume is within normal limits for age. There is chronic subcortical white matter hypodensity at the anterior left operculum which is stable (series 4, image 22). Mild chronic overlying cortical encephalomalacia. Otherwise normal Gray-white matter differentiation throughout the brain. No midline shift, ventriculomegaly, mass effect, evidence of mass lesion, intracranial hemorrhage or evidence of cortically based acute infarction. Vascular: Mild for age Calcified atherosclerosis at the skull base. No suspicious intracranial vascular hyperdensity. Skull: Stable and intact. Sinuses/Orbits: Mild frontoethmoidal fluid and bubbly opacity. Mild maxillary mucosal thickening. Other paranasal sinuses are well pneumatized. There is trace bilateral mastoid fluid. Other: Broad-based right side scalp hematoma up to 8 millimeters in thickness. Hematoma tracks anteriorly to the right forehead. New left forehead soft tissue scarring since 2013. Orbits soft tissues appears stable and negative. Mild right premalar soft tissue stranding. CT CERVICAL SPINE FINDINGS Alignment: Straightening of cervical lordosis. Cervicothoracic junction alignment is within normal limits. Bilateral posterior element alignment is within normal limits. Skull base and vertebrae: Visualized skull base is intact. No atlanto-occipital dissociation. Ankylosis of the left C2-C3 posterior elements. Interbody ankylosis at C5-C6, appears acquired rather than congenital.  Developing ankylosis at C7-T1 including the left posterior element. No acute osseous abnormality identified. Soft tissues and spinal canal: No prevertebral fluid or swelling. No visible canal hematoma. Asymmetric soft tissue stranding in the right neck and supraclavicular fossa (series 9, image 57). No discrete hematoma.  Partially retropharyngeal course of the carotids, normal variant. Disc levels: Widespread cervical spine degeneration. Mild spinal stenosis at least at C3-C4 and C6-C7. Upper chest: Visible upper thoracic levels appear intact. The right neck soft tissue swelling continues to the anterior right chest wall. IMPRESSION: 1. Right scalp hematoma without skull fracture. 2. Stable since 2013 non contrast CT appearance of the brain. Minimal encephalomalacia at the anterior left operculum. 3. Probably posttraumatic soft tissue stranding/contusion in the right neck, but no discrete neck hematoma. 4. No acute traumatic injury identified in the cervical spine. 5. 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 Electronically Signed   By: Genevie Ann M.D.   On: 09/01/2018 00:03   Ct Cervical Spine Wo Contrast  Result Date: 09/01/2018 CLINICAL DATA:  Today. Large forehead swelling. EXAM: CT HEAD WITHOUT CONTRAST CT CERVICAL SPINE WITHOUT CONTRAST TECHNIQUE: Multidetector CT imaging of the head and cervical spine was performed following the standard protocol without intravenous contrast. Multiplanar CT image reconstructions of the cervical spine were also generated. COMPARISON:  Head CT without contrast 06/18/2011 FINDINGS: CT HEAD FINDINGS Brain: Cerebral volume is within normal limits for age. There is chronic subcortical white matter hypodensity at the anterior left operculum which is stable (series 4, image 22). Mild chronic overlying cortical encephalomalacia. Otherwise normal Gray-white matter differentiation throughout the brain. No midline shift, ventriculomegaly, mass effect, evidence of mass lesion, intracranial hemorrhage or evidence of cortically based acute infarction. Vascular: Mild for age Calcified atherosclerosis at the skull base. No suspicious intracranial vascular hyperdensity. Skull: Stable and intact. Sinuses/Orbits: Mild frontoethmoidal fluid and bubbly opacity. Mild maxillary mucosal  thickening. Other paranasal sinuses are well pneumatized. There is trace bilateral mastoid fluid. Other: Broad-based right side scalp hematoma up to 8 millimeters in thickness. Hematoma tracks anteriorly to the right forehead. New left forehead soft tissue scarring since 2013. Orbits soft tissues appears stable and negative. Mild right premalar soft tissue stranding. CT CERVICAL SPINE FINDINGS Alignment: Straightening of cervical lordosis. Cervicothoracic junction alignment is within normal limits. Bilateral posterior element alignment is within normal limits. Skull base and vertebrae: Visualized skull base is intact. No atlanto-occipital dissociation. Ankylosis of the left C2-C3 posterior elements. Interbody ankylosis at C5-C6, appears acquired rather than congenital. Developing ankylosis at C7-T1 including the left posterior element. No acute osseous abnormality identified. Soft tissues and spinal canal: No prevertebral fluid or swelling. No visible canal hematoma. Asymmetric soft tissue stranding in the right neck and supraclavicular fossa (series 9, image 57). No discrete hematoma. Partially retropharyngeal course of the carotids, normal variant. Disc levels: Widespread cervical spine degeneration. Mild spinal stenosis at least at C3-C4 and C6-C7. Upper chest: Visible upper thoracic levels appear intact. The right neck soft tissue swelling continues to the anterior right chest wall. IMPRESSION: 1. Right scalp hematoma without skull fracture. 2. Stable since 2013 non contrast CT appearance of the brain. Minimal encephalomalacia at the anterior left operculum. 3. Probably posttraumatic soft tissue stranding/contusion in the right neck, but no discrete neck hematoma. 4. No acute traumatic injury identified in the cervical spine. 5. 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 Electronically Signed   By: Genevie Ann M.D.   On: 09/01/2018 00:03   US Renal  Result Date: 09/01/2018 CLINICAL  DATA:   Acute renal failure EXAM: RENAL / URINARY TRACT ULTRASOUND COMPLETE COMPARISON:  None. FINDINGS: Right Kidney: Renal measurements: 9.3 x 3.8 x 4.2 cm = volume: 80 mL. Cortical thinning and mild lobulation. Negative for mass or hydronephrosis. Left Kidney: Renal measurements: 8.5 x 4.8 x 4.1 cm = volume: 86 mL. Cortical thinning and mild lobulation. Negative for mass or hydronephrosis. Bladder: Appears normal for degree of bladder distention. IMPRESSION: 1. Symmetric renal atrophy. 2. No hydronephrosis or other reversible finding. Electronically Signed   By: Monte Fantasia M.D.   On: 09/01/2018 08:27   Dg Pelvis Portable  Result Date: 08/31/2018 CLINICAL DATA:  Fall 2 days ago with pelvic pain, initial encounter EXAM: PORTABLE PELVIS 1-2 VIEWS COMPARISON:  None. FINDINGS: Degenerative changes of the lumbar spine are seen. Degenerative changes of the hip joints are noted bilaterally. No soft tissue abnormality is seen. No fractures are seen. IMPRESSION: No acute abnormality noted. Electronically Signed   By: Inez Catalina M.D.   On: 08/31/2018 23:57   Dg Chest Port 1 View  Result Date: 09/03/2018 CLINICAL DATA:  Respiratory failure EXAM: PORTABLE CHEST 1 VIEW COMPARISON:  08/31/2018 FINDINGS: Cardiac shadow is stable. Aortic calcifications are seen. Mild bibasilar atelectasis is noted. No focal confluent infiltrate is seen. No bony abnormality is noted. IMPRESSION: Mild bibasilar atelectasis. Electronically Signed   By: Inez Catalina M.D.   On: 09/03/2018 07:17   Dg Chest Port 1 View  Result Date: 08/31/2018 CLINICAL DATA:  Recent fall with chest pain, initial encounter EXAM: PORTABLE CHEST 1 VIEW COMPARISON:  None. FINDINGS: Cardiac shadow is at the upper limits of normal in size. Aortic calcifications are seen. The lungs are well aerated without focal infiltrate or sizable effusion. No bony abnormality is seen. IMPRESSION: No acute abnormality noted. Electronically Signed   By: Inez Catalina M.D.   On:  08/31/2018 23:58   Dg Knee Right Port  Result Date: 08/31/2018 CLINICAL DATA:  Recent fall with right knee pain, initial encounter EXAM: PORTABLE RIGHT KNEE - 2 VIEW COMPARISON:  None. FINDINGS: Degenerative changes of the right knee joint are seen. No sizable effusion is noted. No acute fracture or dislocation is seen. IMPRESSION: Degenerative change without acute abnormality. Electronically Signed   By: Inez Catalina M.D.   On: 08/31/2018 23:57   US Abdomen Limited Ruq  Result Date: 09/01/2018 CLINICAL DATA:  Abnormal liver function tests EXAM: ULTRASOUND ABDOMEN LIMITED RIGHT UPPER QUADRANT COMPARISON:  None. FINDINGS: Gallbladder: History of cholecystectomy. Shadowing gas is seen in the gallbladder fossa. Common bile duct: Diameter: 5 mm.  Where visualized, no filling defect. Liver: Borderline starry sky appearance which is likely technical in this case. No evidence of mass lesion. Portal vein is patent on color Doppler imaging with normal direction of blood flow towards the liver. IMPRESSION: Negative right upper quadrant ultrasound after cholecystectomy. Electronically Signed   By: Monte Fantasia M.D.   On: 09/01/2018 04:15      Subjective: Feels better after having several bowel movements yesterday, no new complaints today  Discharge Exam: Vitals:   09/08/18 0427 09/08/18 1414 09/08/18 2013 09/09/18 0416  BP: 129/80 121/90 110/63 116/73  Pulse: 61 81 (!) 59 (!) 55  Resp:  18 18 16   Temp: 97.9 F (36.6 C) 97.6 F (36.4 C) 98 F (36.7 C) 97.7 F (36.5 C)  TempSrc: Oral Oral Oral Oral  SpO2: 97% 99% 99% 97%  Weight:      Height:  General: Pt is alert, awake, not in acute distress Cardiovascular: RRR, S1/S2 +, no rubs, no gallops Respiratory: CTA bilaterally, no wheezing, no rhonchi Abdominal: Soft, NT, ND, bowel sounds + Extremities: no edema, no cyanosis    The results of significant diagnostics from this hospitalization (including imaging, microbiology, ancillary  and laboratory) are listed below for reference.     Microbiology: Recent Results (from the past 240 hour(s))  SARS Coronavirus 2 (CEPHEID - Performed in [place] hospital lab), Hosp Order     Status: None   Collection Time: 09/01/18  2:00 AM   Specimen: Nasopharyngeal Swab  Result Value Ref Range Status   SARS Coronavirus 2 NEGATIVE NEGATIVE Final    Comment: (NOTE) If result is NEGATIVE SARS-CoV-2 target nucleic acids are NOT DETECTED. The SARS-CoV-2 RNA is generally detectable in upper and lower  respiratory specimens during the acute phase of infection. The lowest  concentration of SARS-CoV-2 viral copies this assay can detect is 250  copies / mL. A negative result does not preclude SARS-CoV-2 infection  and should not be used as the sole basis for treatment or other  patient management decisions.  A negative result may occur with  improper specimen collection / handling, submission of specimen other  than nasopharyngeal swab, presence of viral mutation(s) within the  areas targeted by this assay, and inadequate number of viral copies  (<250 copies / mL). A negative result must be combined with clinical  observations, patient history, and epidemiological information. If result is POSITIVE SARS-CoV-2 target nucleic acids are DETECTED. The SARS-CoV-2 RNA is generally detectable in upper and lower  respiratory specimens dur ing the acute phase of infection.  Positive  results are indicative of active infection with SARS-CoV-2.  Clinical  correlation with patient history and other diagnostic information is  necessary to determine patient infection status.  Positive results do  not rule out bacterial infection or co-infection with other viruses. If result is PRESUMPTIVE POSTIVE SARS-CoV-2 nucleic acids MAY BE PRESENT.   A presumptive positive result was obtained on the submitted specimen  and confirmed on repeat testing.  While 2019 novel coronavirus  (SARS-CoV-2) nucleic acids  may be present in the submitted sample  additional confirmatory testing may be necessary for epidemiological  and / or clinical management purposes  to differentiate between  SARS-CoV-2 and other Sarbecovirus currently known to infect humans.  If clinically indicated additional testing with an alternate test  methodology [phone]) is advised. The SARS-CoV-2 RNA is generally  detectable in upper and lower respiratory sp ecimens during the acute  phase of infection. The expected result is Negative. Fact Sheet for Patients:  StrictlyIdeas.no Fact Sheet for Healthcare Providers: BankingDealers.co.za This test is not yet approved or cleared by the [place] FDA and has been authorized for detection and/or diagnosis of SARS-CoV-2 by FDA under an Emergency Use Authorization (EUA).  This EUA will remain in effect (meaning this test can be used) for the duration of the COVID-19 declaration under Section 564(b)(1) of the Act, 21 U.S.C. section 360bbb-3(b)(1), unless the authorization is terminated or revoked sooner. Performed at [facility], [place] [address], [place], [place] [address]   MRSA PCR Screening     Status: None   Collection Time: 09/02/18 11:28 AM   Specimen: Nasal Mucosa; Nasopharyngeal  Result Value Ref Range Status   MRSA by PCR NEGATIVE NEGATIVE Final    Comment:        The GeneXpert MRSA Assay (FDA approved for NASAL specimens  only), is one component of a comprehensive MRSA colonization surveillance program. It is not intended to diagnose MRSA infection nor to guide or monitor treatment for MRSA infections. Performed at [facility], [place] [address], [place], [place] [address]   Urine culture     Status: Abnormal   Collection Time: 09/07/18  7:45 PM   Specimen: Urine, Random  Result Value Ref Range Status   Specimen Description URINE, RANDOM  Final   Special Requests   Final    NONE Performed at [facility], [place] [address] [place], [place] [address]    Culture MULTIPLE SPECIES PRESENT, SUGGEST RECOLLECTION (A)  Final   Report Status 09/08/2018 FINAL  Final  SARS Coronavirus 2 (CEPHEID - Performed in [place] hospital lab), Hosp Order     Status: None   Collection Time: 09/07/18  9:32 PM   Specimen: Nasopharyngeal Swab  Result Value Ref Range Status   SARS Coronavirus 2 NEGATIVE NEGATIVE Final    Comment: (NOTE) If result is NEGATIVE SARS-CoV-2 target nucleic acids are NOT DETECTED. The SARS-CoV-2 RNA is generally detectable in upper and lower  respiratory specimens during the acute phase of infection. The lowest  concentration of SARS-CoV-2 viral copies this assay can detect is 250  copies / mL. A negative result does not preclude SARS-CoV-2 infection  and should not be used as the sole basis for treatment or other  patient management decisions.  A negative result may occur with  improper specimen collection / handling, submission of specimen other  than nasopharyngeal swab, presence of viral mutation(s) within the  areas targeted by this assay, and inadequate number of viral copies  (<250 copies / mL). A negative result must be combined with clinical  observations, patient history, and epidemiological information. If result is POSITIVE SARS-CoV-2 target nucleic acids are DETECTED. The SARS-CoV-2 RNA is generally detectable in upper and lower  respiratory specimens dur ing the acute phase of infection.  Positive  results are indicative of active infection with SARS-CoV-2.  Clinical  correlation with patient history and other diagnostic information is  necessary to determine patient infection status.  Positive results do  not rule out bacterial infection or co-infection with other viruses. If result is PRESUMPTIVE POSTIVE SARS-CoV-2 nucleic acids MAY BE PRESENT.   A presumptive positive result was obtained on the submitted specimen  and confirmed on repeat testing.   While 2019 novel coronavirus  (SARS-CoV-2) nucleic acids may be present in the submitted sample  additional confirmatory testing may be necessary for epidemiological  and / or clinical management purposes  to differentiate between  SARS-CoV-2 and other Sarbecovirus currently known to infect humans.  If clinically indicated additional testing with an alternate test  methodology [phone]) is advised. The SARS-CoV-2 RNA is generally  detectable in upper and lower respiratory sp ecimens during the acute  phase of infection. The expected result is Negative. Fact Sheet for Patients:  StrictlyIdeas.no Fact Sheet for Healthcare Providers: BankingDealers.co.za This test is not yet approved or cleared by the [place] FDA and has been authorized for detection and/or diagnosis of SARS-CoV-2 by FDA under an Emergency Use Authorization (EUA).  This EUA will remain in effect (meaning this test can be used) for the duration of the COVID-19 declaration under Section 564(b)(1) of the Act, 21 U.S.C. section 360bbb-3(b)(1), unless the authorization is terminated or revoked sooner. Performed at [facility], [place] [address] [place], [place] [address]      Labs:  BNP (last 3 results) No results for input(s): BNP in the last 8760 hours. Basic Metabolic Panel: Recent Labs  Lab 09/03/18 0516 09/04/18 0317 09/05/18 0228 09/07/18 1607 09/08/18 0727  NA 139 134* 133* 136 140  K 4.3 3.9 3.6 3.3* 3.8  CL 108 103 98 100 104  CO2 19* 19* 25 25 25   GLUCOSE 120* 153* 142* 229* 177*  BUN 62* 41* 28* 21 16  CREATININE 1.51* 1.24* 0.98 0.92 0.82  CALCIUM 8.5* 8.5* 8.5* 8.6* 8.4*  MG 2.1  --   --   --   --   PHOS 2.9  --   --   --   --    Liver Function Tests: Recent Labs  Lab 09/02/18 1431 09/03/18 0516 09/04/18 0317 09/05/18 0228 09/08/18 0727  AST 1,134* 474* 160* 99* 48*  ALT 584* 381* 220* 161* 87*  ALKPHOS 154* 145* 167* 218* 199*   BILITOT 3.6* 3.7* 3.1* 1.6* 1.1  PROT 5.3* 5.4* 4.8* 4.9* 5.5*  ALBUMIN 2.3* 2.1* 1.9* 1.9* 2.3*   No results for input(s): LIPASE, AMYLASE in the last 168 hours. No results for input(s): AMMONIA in the last 168 hours. CBC: Recent Labs  Lab 09/02/18 1431 09/03/18 0516 09/04/18 0826 09/07/18 1607 09/08/18 0727  WBC 13.2* 10.8* 8.3 8.4 9.1  NEUTROABS  --   --   --  6.1  --   HGB 13.1 12.9 13.2 13.0 13.6  HCT 39.5 39.8 39.2 39.0 40.5  MCV 98.0 99.5 94.7 95.1 94.2  PLT 159 135* 135* 175 187   Cardiac Enzymes: Recent Labs  Lab 09/03/18 0516  CKTOTAL 350*   BNP: Invalid input(s): POCBNP CBG: Recent Labs  Lab 09/08/18 0746 09/08/18 1143 09/08/18 1642 09/08/18 2134 09/09/18 0822  GLUCAP 159* 177* 123* 198* 170*   D-Dimer No results for input(s): DDIMER in the last 72 hours. Hgb A1c No results for input(s): HGBA1C in the last 72 hours. Lipid Profile No results for input(s): CHOL, HDL, LDLCALC, TRIG, CHOLHDL, LDLDIRECT in the last 72 hours. Thyroid function studies No results for input(s): TSH, T4TOTAL, T3FREE, THYROIDAB in the last 72 hours.  Invalid input(s): FREET3 Anemia work up No results for input(s): VITAMINB12, FOLATE, FERRITIN, TIBC, IRON, RETICCTPCT in the last 72 hours. Urinalysis    Component Value Date/Time   COLORURINE YELLOW 09/07/2018 1956   APPEARANCEUR CLEAR 09/07/2018 1956   LABSPEC 1.004 (L) 09/07/2018 1956   PHURINE 6.0 09/07/2018 1956   GLUCOSEU NEGATIVE 09/07/2018 1956   HGBUR MODERATE (A) 09/07/2018 1956   BILIRUBINUR NEGATIVE 09/07/2018 1956   KETONESUR NEGATIVE 09/07/2018 1956   PROTEINUR NEGATIVE 09/07/2018 1956   UROBILINOGEN 0.2 12/29/2011 1540   NITRITE NEGATIVE 09/07/2018 1956   LEUKOCYTESUR LARGE (A) 09/07/2018 1956   Sepsis Labs Invalid input(s): PROCALCITONIN,  WBC,  LACTICIDVEN Microbiology Recent Results (from the past 240 hour(s))  SARS Coronavirus 2 (CEPHEID - Performed in [place] hospital lab), Hosp Order      Status: None   Collection Time: 09/01/18  2:00 AM   Specimen: Nasopharyngeal Swab  Result Value Ref Range Status   SARS Coronavirus 2 NEGATIVE NEGATIVE Final    Comment: (NOTE) If result is NEGATIVE SARS-CoV-2 target nucleic acids are NOT DETECTED. The SARS-CoV-2 RNA is generally detectable in upper and lower  respiratory specimens during the acute phase of infection. The lowest  concentration of SARS-CoV-2 viral copies this assay can detect is 250  copies / mL. A negative result does not preclude SARS-CoV-2 infection  and  should not be used as the sole basis for treatment or other  patient management decisions.  A negative result may occur with  improper specimen collection / handling, submission of specimen other  than nasopharyngeal swab, presence of viral mutation(s) within the  areas targeted by this assay, and inadequate number of viral copies  (<250 copies / mL). A negative result must be combined with clinical  observations, patient history, and epidemiological information. If result is POSITIVE SARS-CoV-2 target nucleic acids are DETECTED. The SARS-CoV-2 RNA is generally detectable in upper and lower  respiratory specimens dur ing the acute phase of infection.  Positive  results are indicative of active infection with SARS-CoV-2.  Clinical  correlation with patient history and other diagnostic information is  necessary to determine patient infection status.  Positive results do  not rule out bacterial infection or co-infection with other viruses. If result is PRESUMPTIVE POSTIVE SARS-CoV-2 nucleic acids MAY BE PRESENT.   A presumptive positive result was obtained on the submitted specimen  and confirmed on repeat testing.  While 2019 novel coronavirus  (SARS-CoV-2) nucleic acids may be present in the submitted sample  additional confirmatory testing may be necessary for epidemiological  and / or clinical management purposes  to differentiate between  SARS-CoV-2 and other  Sarbecovirus currently known to infect humans.  If clinically indicated additional testing with an alternate test  methodology [phone]) is advised. The SARS-CoV-2 RNA is generally  detectable in upper and lower respiratory sp ecimens during the acute  phase of infection. The expected result is Negative. Fact Sheet for Patients:  StrictlyIdeas.no Fact Sheet for Healthcare Providers: BankingDealers.co.za This test is not yet approved or cleared by the [place] FDA and has been authorized for detection and/or diagnosis of SARS-CoV-2 by FDA under an Emergency Use Authorization (EUA).  This EUA will remain in effect (meaning this test can be used) for the duration of the COVID-19 declaration under Section 564(b)(1) of the Act, 21 U.S.C. section 360bbb-3(b)(1), unless the authorization is terminated or revoked sooner. Performed at [facility], [place] [address], [place], [place] [address]   MRSA PCR Screening     Status: None   Collection Time: 09/02/18 11:28 AM   Specimen: Nasal Mucosa; Nasopharyngeal  Result Value Ref Range Status   MRSA by PCR NEGATIVE NEGATIVE Final    Comment:        The GeneXpert MRSA Assay (FDA approved for NASAL specimens only), is one component of a comprehensive MRSA colonization surveillance program. It is not intended to diagnose MRSA infection nor to guide or monitor treatment for MRSA infections. Performed at [facility], [place] [address] [place], [place] [address]   Urine culture     Status: Abnormal   Collection Time: 09/07/18  7:45 PM   Specimen: Urine, Random  Result Value Ref Range Status   Specimen Description URINE, RANDOM  Final   Special Requests   Final    NONE Performed at [facility], [place] [address] [place], [place] [address]    Culture MULTIPLE SPECIES PRESENT, SUGGEST RECOLLECTION (A)  Final   Report Status 09/08/2018 FINAL  Final  SARS Coronavirus 2 (CEPHEID -  Performed in [place] hospital lab), Hosp Order     Status: None   Collection Time: 09/07/18  9:32 PM   Specimen: Nasopharyngeal Swab  Result Value Ref Range Status   SARS Coronavirus 2 NEGATIVE NEGATIVE Final    Comment: (NOTE) If result is NEGATIVE SARS-CoV-2 target  nucleic acids are NOT DETECTED. The SARS-CoV-2 RNA is generally detectable in upper and lower  respiratory specimens during the acute phase of infection. The lowest  concentration of SARS-CoV-2 viral copies this assay can detect is 250  copies / mL. A negative result does not preclude SARS-CoV-2 infection  and should not be used as the sole basis for treatment or other  patient management decisions.  A negative result may occur with  improper specimen collection / handling, submission of specimen other  than nasopharyngeal swab, presence of viral mutation(s) within the  areas targeted by this assay, and inadequate number of viral copies  (<250 copies / mL). A negative result must be combined with clinical  observations, patient history, and epidemiological information. If result is POSITIVE SARS-CoV-2 target nucleic acids are DETECTED. The SARS-CoV-2 RNA is generally detectable in upper and lower  respiratory specimens dur ing the acute phase of infection.  Positive  results are indicative of active infection with SARS-CoV-2.  Clinical  correlation with patient history and other diagnostic information is  necessary to determine patient infection status.  Positive results do  not rule out bacterial infection or co-infection with other viruses. If result is PRESUMPTIVE POSTIVE SARS-CoV-2 nucleic acids MAY BE PRESENT.   A presumptive positive result was obtained on the submitted specimen  and confirmed on repeat testing.  While 2019 novel coronavirus  (SARS-CoV-2) nucleic acids may be present in the submitted sample  additional confirmatory testing may be necessary for epidemiological  and / or clinical management  purposes  to differentiate between  SARS-CoV-2 and other Sarbecovirus currently known to infect humans.  If clinically indicated additional testing with an alternate test  methodology [phone]) is advised. The SARS-CoV-2 RNA is generally  detectable in upper and lower respiratory sp ecimens during the acute  phase of infection. The expected result is Negative. Fact Sheet for Patients:  StrictlyIdeas.no Fact Sheet for Healthcare Providers: BankingDealers.co.za This test is not yet approved or cleared by the [place] FDA and has been authorized for detection and/or diagnosis of SARS-CoV-2 by FDA under an Emergency Use Authorization (EUA).  This EUA will remain in effect (meaning this test can be used) for the duration of the COVID-19 declaration under Section 564(b)(1) of the Act, 21 U.S.C. section 360bbb-3(b)(1), unless the authorization is terminated or revoked sooner. Performed at [facility], [place] [address] [place], [place] [address]      Time coordinating discharge: 70mins  SIGNED:   Kathie Dike, MD  [facility] 09/09/2018, 10:30 AM   If 7PM-7AM, please contact night-coverage [email or web address]

## 2018-09-09 NOTE — Discharge Instructions (Signed)

## 2018-09-09 NOTE — Plan of Care (Signed)
  Problem: Education: Goal: Knowledge of General Education information will improve Description: Including pain rating scale, medication(s)/side effects and non-pharmacologic comfort measures Outcome: Progressing   Problem: Nutrition: Goal: Adequate nutrition will be maintained Outcome: Progressing   Problem: Elimination: Goal: Will not experience complications related to bowel motility Outcome: Progressing   

## 2018-09-10 LAB — GLUCOSE, CAPILLARY
Glucose-Capillary: 103 mg/dL — ABNORMAL HIGH (ref 70–99)
Glucose-Capillary: 215 mg/dL — ABNORMAL HIGH (ref 70–99)
Glucose-Capillary: 213 mg/dL — ABNORMAL HIGH (ref 70–99)
Glucose-Capillary: 193 mg/dL — ABNORMAL HIGH (ref 70–99)

## 2018-09-10 LAB — NOVEL CORONAVIRUS, NAA (HOSP ORDER, SEND-OUT TO REF LAB; TAT 18-24 HRS): SARS-CoV-2, NAA: NOT DETECTED

## 2018-09-10 MED ORDER — SENNOSIDES-DOCUSATE SODIUM 8.6-50 MG PO TABS
1.0000 | ORAL_TABLET | Freq: Two times a day (BID) | ORAL | Status: DC
  Administered 2018-09-10 – 2018-09-12 (×4): 1 via ORAL
  Filled 2018-09-10 (×4): qty 1

## 2018-09-10 MED ORDER — SENNOSIDES-DOCUSATE SODIUM 8.6-50 MG PO TABS: 1.0000 | ORAL_TABLET | Freq: Two times a day (BID) | ORAL | Status: AC

## 2018-09-10 NOTE — Progress Notes (Signed)
Occupational Therapy Treatment Patient Details Name: Teresa Peck MRN: [id number] DOB: 23-Aug-1940 Today's Date: 09/10/2018    History of present illness 78 year old with PMH including hypertension, hyperlipidemia, Dm2 w neuropathy, CAD , CHF , PAD, Anemia Patient discharged from here recently, went home and was unable to care for herself or get out of chair.   OT comments  Pt progressing towards OT goals this session. Pt able to perform transfers at min A with RW vc for safe hand placement. Pt transferred to chair to eat lunch and agreeable to grooming in chair with set up. Pt continues to require SNF level care post-acute.    Follow Up Recommendations  SNF;Supervision/Assistance - 24 hour    Equipment Recommendations  Other (comment)(defer to next venue of care)    Recommendations for Other Services      Precautions / Restrictions Precautions Precautions: Fall       Mobility Bed Mobility Overal bed mobility: Needs Assistance Bed Mobility: Sit to Supine     Supine to sit: Mod assist;HOB elevated     General bed mobility comments: mod A for trunk elevation and use of bed pad to  bring hips EOB  Transfers Overall transfer level: Needs assistance Equipment used: Rolling walker (2 wheeled) Transfers: Sit to/from Stand Sit to Stand: Min assist;From elevated surface(elevated bed)         General transfer comment: vc for safe hand placement, bed higher     Balance Overall balance assessment: Needs assistance Sitting-balance support: Bilateral upper extremity supported Sitting balance-Leahy Scale: Fair     Standing balance support: Bilateral upper extremity supported Standing balance-Leahy Scale: Poor Standing balance comment: dependent on external support from therapists and gait belt essential                           ADL either performed or assessed with clinical judgement   ADL Overall ADL's : Needs assistance/impaired     Grooming:  Sitting;Brushing hair;Wash/dry face;Min guard Grooming Details (indicate cue type and reason): in recliner                                     Vision       Perception     Praxis      Cognition Arousal/Alertness: Awake/alert Behavior During Therapy: WFL for tasks assessed/performed Overall Cognitive Status: Impaired/Different from baseline Area of Impairment: Safety/judgement;Problem solving                         Safety/Judgement: Decreased awareness of safety;Decreased awareness of deficits   Problem Solving: Slow processing General Comments: improving cognition        Exercises     Shoulder Instructions       General Comments      Pertinent Vitals/ Pain       Pain Assessment: Faces Faces Pain Scale: Hurts even more Pain Location: neck and bottom Pain Descriptors / Indicators: Moaning;Discomfort;Grimacing;Sore Pain Intervention(s): Limited activity within patient's tolerance;Monitored during session;Repositioned  Home Living                                          Prior Functioning/Environment              Frequency  Min 2X/week  Progress Toward Goals  OT Goals(current goals can now be found in the care plan section)  Progress towards OT goals: Progressing toward goals  Acute Rehab OT Goals Patient Stated Goal: to go to rehab OT Goal Formulation: With patient Time For Goal Achievement: 09/22/18 Potential to Achieve Goals: Good  Plan Discharge plan remains appropriate;Frequency remains appropriate    Co-evaluation                 AM-PAC OT "6 Clicks" Daily Activity     Outcome Measure   Help from another person eating meals?: None Help from another person taking care of personal grooming?: A Little Help from another person toileting, which includes using toliet, bedpan, or urinal?: A Lot Help from another person bathing (including washing, rinsing, drying)?: A Lot Help from  another person to put on and taking off regular upper body clothing?: A Lot Help from another person to put on and taking off regular lower body clothing?: A Lot 6 Click Score: 15    End of Session Equipment Utilized During Treatment: Gait belt;Rolling walker  OT Visit Diagnosis: Unsteadiness on feet (R26.81);Other abnormalities of gait and mobility (R26.89);Repeated falls (R29.6);Muscle weakness (generalized) (M62.81);Other symptoms and signs involving cognitive function;History of falling (Z91.81);Pain Pain - Right/Left: Left(bilateral) Pain - part of body: Knee(neck)   Activity Tolerance Patient tolerated treatment well   Patient Left with call bell/phone within reach;in chair   Nurse Communication Mobility status        Time: 6222-9798 OT Time Calculation (min): 23 min  Charges: OT General Charges $OT Visit: 1 Visit OT Treatments $Self Care/Home Management : 8-22 mins $Therapeutic Activity: 8-22 mins  Hulda Humphrey OTR/L Acute Rehabilitation Services Pager: [phone] Office: [place] 09/10/2018, 6:15 PM

## 2018-09-10 NOTE — Progress Notes (Signed)
Patient seen and examined, discharge completed, awaiting SNF for rehabilitation -CSW following  Domenic Polite, MD

## 2018-09-10 NOTE — Care Management Important Message (Signed)
Important Message  Patient Details  Name: Teresa Peck MRN: [id number] Date of Birth: [birth year]   Medicare Important Message Given:  Yes     Memory Argue 09/10/2018, 4:18 PM

## 2018-09-10 NOTE — TOC Progression Note (Signed)
Transition of Care [facility]) - Progression Note    Patient Details  Name: Teresa Peck MRN: [id number] Date of Birth: [birth year]  Transition of Care [facility]) CM/SW [place], [place] Phone Number: 09/10/2018, 10:06 AM  Clinical Narrative:    Called and spoke with pt daughter in law Teresa Peck, discussed that Teresa Peck is no longer able to offer. Sent additional offer from [facility] to pt daughter in law along with CMS ratings as requested.   Await return response, again let pt daughter in law know that we need response to start authorization.    Expected Discharge Plan: [place] Barriers to Discharge: Continued Medical Work up, Ship broker  Expected Discharge Plan and Services Expected Discharge Plan: [place] In-house Referral: Clinical Social Work Discharge Planning Services: CM Consult Post Acute Care Choice: [place] arrangements for the past 2 months: Single Family Home   Social Determinants of Health (SDOH) Interventions    Readmission Risk Interventions No flowsheet data found.

## 2018-09-10 NOTE — TOC Progression Note (Signed)
Transition of Care [facility]) - Progression Note    Patient Details  Name: SOLYMAR GRACE MRN: [id number] Date of Birth: [birth year]  Transition of Care [facility]) CM/SW [place], [place] Phone Number: 09/10/2018, 2:33 PM  Clinical Narrative:    Pt daughter amenable to [place], initiated insurance approval. Await confirmation of insurance approval.  COVID screen pending.  Pt daughter aware.    Expected Discharge Plan: [place] Barriers to Discharge: Continued Medical Work up, Ship broker  Expected Discharge Plan and Services Expected Discharge Plan: [place] In-house Referral: Clinical Social Work Discharge Planning Services: CM Consult Post Acute Care Choice: [place] arrangements for the past 2 months: Single Family Home   Social Determinants of Health (SDOH) Interventions    Readmission Risk Interventions No flowsheet data found.

## 2018-09-10 NOTE — Progress Notes (Signed)
Physical Therapy Treatment Patient Details Name: Teresa Peck MRN: [id number] DOB: 1940-05-31 Today's Date: 09/10/2018    History of Present Illness 78 year old with PMH including hypertension, hyperlipidemia, Dm2 w neuropathy, CAD , CHF , PAD, Anemia Patient discharged from here recently, went home and was unable to care for herself or get out of chair.    PT Comments    Pt performed gt training and functional mobility during session this am.  She is progressing well and required min guard to min assistance for all mobility.  Pt continues to present with pain and weakness and continues to benefit from SNF placement for continued rehab before returning home.  Plan next session for exercises and progression of gt to tolerance.    Follow Up Recommendations  SNF     Equipment Recommendations  None recommended by PT    Recommendations for Other Services       Precautions / Restrictions Precautions Precautions: Fall Restrictions Weight Bearing Restrictions: No    Mobility  Bed Mobility Overal bed mobility: Needs Assistance Bed Mobility: Sit to Supine       Sit to supine: Min assist   General bed mobility comments: MIn assistance to lift B LEs back to bed against gravity.  Pt able to make it 3/4 of the way but unable to complete task without assistance.  Transfers Overall transfer level: Needs assistance Equipment used: Rolling walker (2 wheeled) Transfers: Sit to/from Stand Sit to Stand: Min guard;Min assist         General transfer comment: Min guard from recliner and min assistance from soft bed.  Pt is slow to ascend and required rocking momentum to achieve standing.  Ambulation/Gait Ambulation/Gait assistance: Min assist Gait Distance (Feet): 150 Feet Assistive device: Rolling walker (2 wheeled) Gait Pattern/deviations: Step-to pattern;Trunk flexed;Narrow base of support;Shuffle     General Gait Details: Cues for hip extension and upper trunk control.   Pt able to progress distance well with cues for ancouragement.  Close chair follow utilized for safety.  Cues for RW safety and positioning,   Stairs             Wheelchair Mobility    Modified Rankin (Stroke Patients Only)       Balance Overall balance assessment: Needs assistance Sitting-balance support: Bilateral upper extremity supported Sitting balance-Leahy Scale: Fair       Standing balance-Leahy Scale: Poor                              Cognition Arousal/Alertness: Awake/alert Behavior During Therapy: WFL for tasks assessed/performed Overall Cognitive Status: (with in functional limits for tasks assessed.  Recalls what happened to her.  Cognition not formally assessed.)                                        Exercises General Exercises - Lower Extremity Ankle Circles/Pumps: AROM;20 reps;Both;Supine Quad Sets: AROM;Both;Supine;10 reps Heel Slides: AAROM;10 reps;Supine;Both Hip ABduction/ADduction: AROM;Both;10 reps;Supine Straight Leg Raises: AROM;Both;10 reps;Supine    General Comments        Pertinent Vitals/Pain Pain Assessment: 0-10 Pain Score: 7  Pain Location: neck and bottom Pain Descriptors / Indicators: Moaning;Discomfort;Grimacing;Sore Pain Intervention(s): Limited activity within patient's tolerance    Home Living  Prior Function            PT Goals (current goals can now be found in the care plan section) Acute Rehab PT Goals Patient Stated Goal: to go to rehab Potential to Achieve Goals: Good Progress towards PT goals: Progressing toward goals    Frequency    Min 3X/week      PT Plan Current plan remains appropriate    Co-evaluation              AM-PAC PT "6 Clicks" Mobility   Outcome Measure  Help needed turning from your back to your side while in a flat bed without using bedrails?: A Little Help needed moving from lying on your back to sitting on the  side of a flat bed without using bedrails?: A Little Help needed moving to and from a bed to a chair (including a wheelchair)?: A Little Help needed standing up from a chair using your arms (e.g., wheelchair or bedside chair)?: A Little Help needed to walk in hospital room?: A Little Help needed climbing 3-5 steps with a railing? : A Lot 6 Click Score: 17    End of Session Equipment Utilized During Treatment: Gait belt Activity Tolerance: Patient tolerated treatment well;Patient limited by pain;Patient limited by fatigue Patient left: in chair;with call bell/phone within reach Nurse Communication: Mobility status PT Visit Diagnosis: Unsteadiness on feet (R26.81);Muscle weakness (generalized) (M62.81);Difficulty in walking, not elsewhere classified (R26.2);Pain;History of falling (Z91.81) Pain - Right/Left: (R and left) Pain - part of body: Knee     Time: 6945-0388 PT Time Calculation (min) (ACUTE ONLY): 18 min  Charges:  $Gait Training: 8-22 mins                     Governor Rooks, PTA Acute Rehabilitation Services Pager [phone] Office [phone]     Teresa Peck 09/10/2018, 11:31 AM

## 2018-09-11 LAB — GLUCOSE, CAPILLARY
Glucose-Capillary: 126 mg/dL — ABNORMAL HIGH (ref 70–99)
Glucose-Capillary: 167 mg/dL — ABNORMAL HIGH (ref 70–99)
Glucose-Capillary: 220 mg/dL — ABNORMAL HIGH (ref 70–99)
Glucose-Capillary: 180 mg/dL — ABNORMAL HIGH (ref 70–99)

## 2018-09-11 MED ORDER — LIDOCAINE 5 % EX PTCH
1.0000 | MEDICATED_PATCH | CUTANEOUS | Status: DC
  Administered 2018-09-11 – 2018-09-12 (×2): 1 via TRANSDERMAL
  Filled 2018-09-11 (×2): qty 1

## 2018-09-11 NOTE — Progress Notes (Signed)
Pt seen and examined,  In good spirits, had BM yesterday after [place] -awaiting SNF for Rehab  Domenic Polite, MD

## 2018-09-12 DIAGNOSIS — R262 Difficulty in walking, not elsewhere classified: Secondary | ICD-10-CM | POA: Diagnosis not present

## 2018-09-12 DIAGNOSIS — K219 Gastro-esophageal reflux disease without esophagitis: Secondary | ICD-10-CM | POA: Diagnosis not present

## 2018-09-12 DIAGNOSIS — W19XXXA Unspecified fall, initial encounter: Secondary | ICD-10-CM | POA: Diagnosis not present

## 2018-09-12 DIAGNOSIS — Z7401 Bed confinement status: Secondary | ICD-10-CM | POA: Diagnosis not present

## 2018-09-12 DIAGNOSIS — R531 Weakness: Secondary | ICD-10-CM | POA: Diagnosis not present

## 2018-09-12 DIAGNOSIS — R5381 Other malaise: Secondary | ICD-10-CM | POA: Diagnosis not present

## 2018-09-12 DIAGNOSIS — M25561 Pain in right knee: Secondary | ICD-10-CM | POA: Diagnosis not present

## 2018-09-12 DIAGNOSIS — E785 Hyperlipidemia, unspecified: Secondary | ICD-10-CM | POA: Diagnosis not present

## 2018-09-12 DIAGNOSIS — R296 Repeated falls: Secondary | ICD-10-CM | POA: Diagnosis not present

## 2018-09-12 DIAGNOSIS — I2510 Atherosclerotic heart disease of native coronary artery without angina pectoris: Secondary | ICD-10-CM | POA: Diagnosis not present

## 2018-09-12 DIAGNOSIS — I5022 Chronic systolic (congestive) heart failure: Secondary | ICD-10-CM | POA: Diagnosis not present

## 2018-09-12 DIAGNOSIS — L89310 Pressure ulcer of right buttock, unstageable: Secondary | ICD-10-CM | POA: Diagnosis not present

## 2018-09-12 DIAGNOSIS — M2550 Pain in unspecified joint: Secondary | ICD-10-CM | POA: Diagnosis not present

## 2018-09-12 DIAGNOSIS — E119 Type 2 diabetes mellitus without complications: Secondary | ICD-10-CM | POA: Diagnosis not present

## 2018-09-12 DIAGNOSIS — M25562 Pain in left knee: Secondary | ICD-10-CM | POA: Diagnosis not present

## 2018-09-12 DIAGNOSIS — I482 Chronic atrial fibrillation, unspecified: Secondary | ICD-10-CM | POA: Diagnosis not present

## 2018-09-12 DIAGNOSIS — R300 Dysuria: Secondary | ICD-10-CM | POA: Diagnosis not present

## 2018-09-12 DIAGNOSIS — K5900 Constipation, unspecified: Secondary | ICD-10-CM | POA: Diagnosis not present

## 2018-09-12 LAB — GLUCOSE, CAPILLARY
Glucose-Capillary: 166 mg/dL — ABNORMAL HIGH (ref 70–99)
Glucose-Capillary: 181 mg/dL — ABNORMAL HIGH (ref 70–99)
Glucose-Capillary: 185 mg/dL — ABNORMAL HIGH (ref 70–99)

## 2018-09-12 NOTE — Progress Notes (Signed)
Called report to [place] at [facility].

## 2018-09-12 NOTE — Social Work (Signed)
Clinical Social Worker facilitated patient discharge including contacting patient family and facility to confirm patient discharge plans.  Clinical information faxed to facility and family agreeable with plan.  CSW arranged ambulance transport via [facility] to [facility]. RN to call [phone]  with report prior to discharge.  Clinical Social Worker will sign off for now as social work intervention is no longer needed. Please consult us again if new need arises.  Lalanya Rufener, MSW, LCSWA Clinical Social Worker [phone]  

## 2018-09-12 NOTE — TOC Transition Note (Signed)
Transition of Care [facility]) - CM/SW Discharge Note   Patient Details  Name: Teresa Peck MRN: [id number] Date of Birth: 06/26/1940  Transition of Care [facility]) CM/SW Contact:  Alexander [place], [place] Phone Number: 09/12/2018, 4:29 PM   Clinical Narrative:    CSW received confirmation of auth from admissions at [facility]. Pt and pt daughter aware, [facility] arranged for 4:30pm provided names of admissions staff. Called and discussed again what auth means and that Kathleen Argue will continue to submit for the pt's full 20 days to be covered so long as it was medically necessary. Pt daughter in law states understanding.   PTAR papers on chart, no controlled scripts noted, all information sent through the hub.    Final next level of care: Skilled Nursing Facility Barriers to Discharge: Barriers Resolved   Patient Goals and CMS Choice Patient states their goals for this hospitalization and ongoing recovery are:: to be able to sit up in the bed and take care of herself again CMS Medicare.gov Compare Post Acute Care list provided to:: Patient(and adult children) Choice offered to / list presented to : Patient, Adult Children  Discharge Placement PASRR number recieved: 09/08/18            Patient [facility] bed at: [facility] Patient to be transferred to facility by: [place] Name of family member notified: pt daughter Caren Griffins Patient and family notified of of transfer: 09/12/18  Discharge Plan and Services In-house Referral: Clinical Social Work Discharge Planning Services: [employer] Consult Post Acute Care Choice: [place]           Social Determinants of Health (SDOH) Interventions     Readmission Risk Interventions No flowsheet data found.

## 2018-09-12 NOTE — Discharge Summary (Signed)
Physician Discharge Summary  Teresa Peck VQX:[id number] DOB: 14-May-1940 DOA: 09/07/2018  PCP: Deland Pretty, MD  Admit date: 09/07/2018 Discharge date: 09/12/2018  Admitted From: home Disposition:  SNF  Recommendations for Outpatient Follow-up:  1. Follow up with PCP in 1-2 weeks 2. Please obtain BMP/CBC in one week 3. Monitor pressure ulcer over sacrum, recommend local wound care, barrier dressing, frequent position changes to offload the site  Discharge Condition:stable CODE STATUS:full code Diet recommendation: heart healthy, carb modified  Brief/Interim Summary: 78 year old female with a history of chronic systolic congestive heart failure, ejection fraction 30 to 35%, diabetes, hypertension, was recently discharged from the hospital after being treated for metabolic encephalopathy.  At that time, she was seen by physical therapy and recommended skilled nursing facility placement.  Patient family elected to take the patient home with home health services.  Within a few days, patient returned to the hospital after recurrent falls and generalized weakness.  Family was unable to provide a safe environment for the patient at home.  Urinalysis indicated possible infection.  She was started on antibiotics.  Seen by physical therapy with recommendations for skilled nursing facility placement.  Discharge Diagnoses:    1. Generalized weakness with repeated falls.  Likely secondary to deconditioning.  On her last admission, she was recommended to go to skilled nursing facility, but patient and family elected to go home.  Since returning home, patient reported continued falls. She is now agreeable to go to skilled nursing facility for physical therapy. Social work is assisting in bed placement 2. Chronic atrial fibrillation.  Heart rate is controlled.  She is anticoagulated with apixaban. 3. Possible Urinary tract infection.  Urinalysis indicated possible infection.  She was started on  Rocephin.  Urine culture showed multiple organisms. She is afebrile and does not have any dysuria, antibiotics were discontinued. 4. Hypertension.  Blood pressures currently stable.  Continue metoprolol. 5. GERD.  Continue Pepcid 6. Chronic systolic congestive heart failure.  EF of 30 to 35%.  Currently appears compensated.  Continue home dose of Lasix. 7. Diabetes.  On Lantus and sliding scale insulin.  Blood sugars are currently stable.  Continue current treatments 8. Coronary artery disease.  No complaints of chest pain. 9. Constipation.  Improved after receiving enema, added Senokot to her medication regimen 10. Pressure injuries: 1. Pressure injury to sacrum, stage 2, present on admission, 2. Pressure injury to right 5th toe, unstageable, present on admission, 3. Deep tissue injury, right great toe, present on admission, deep tissue injury, left great toe, present on admission. Continue supportive care.  Discharge Instructions  Discharge Instructions    Diet - low sodium heart healthy   Complete by: As directed    Increase activity slowly   Complete by: As directed      Allergies as of 09/12/2018      Reactions   Sulfur Rash   Rash/flushed, blood red eyes      Medication List    TAKE these medications   acetaminophen 325 MG tablet Commonly known as: TYLENOL Take 2 tablets (650 mg total) by mouth every 6 (six) hours as needed for mild pain or headache.   allopurinol 300 MG tablet Commonly known as: ZYLOPRIM Take 300 mg by mouth daily.   apixaban 5 MG Tabs tablet Commonly known as: ELIQUIS Take 1 tablet (5 mg total) by mouth 2 (two) times daily.   furosemide 40 MG tablet Commonly known as: LASIX Take 40 mg by mouth every other day.   gabapentin  600 MG tablet Commonly known as: NEURONTIN Take 600 mg by mouth 2 (two) times daily.   insulin glargine 100 unit/mL Sopn Commonly known as: LANTUS Inject 0.1 mLs (10 Units total) into the skin daily.   Magnesium Oxide 400  (240 Mg) MG Tabs Take 1 tablet (400 mg total) by mouth daily.   metFORMIN 500 MG tablet Commonly known as: GLUCOPHAGE Take 500 mg by mouth 2 (two) times daily with a meal.   metoprolol succinate 25 MG 24 hr tablet Commonly known as: TOPROL-XL Take 25 mg by mouth daily.   nitroGLYCERIN 0.4 MG SL tablet Commonly known as: NITROSTAT PLACE 1 TABLET UNDER THE TONGUE EVERY 5 MINUTES AS NEEDED FOR CHEST PAIN What changed: See the new instructions.   Restasis 0.05 % ophthalmic emulsion Generic drug: cycloSPORINE Place 1 drop into both eyes 2 (two) times daily.   rosuvastatin 10 MG tablet Commonly known as: CRESTOR Take 10 mg by mouth daily.   senna-docusate 8.6-50 MG tablet Commonly known as: Senokot-S Take 1 tablet by mouth 2 (two) times daily.   ZANTAC PO Take 1 tablet by mouth daily.       Contact information for follow-up providers    Deland Pretty, MD.   Specialty: Internal Medicine Contact information: [address] [place] [address] [phone]            Contact information for after-discharge care    [facility] .   Service: Skilled Nursing Contact information: [address] [address] [phone]                 Allergies  Allergen Reactions  . Sulfur Rash    Rash/flushed, blood red eyes    Consultations:     Procedures/Studies: Dg Wrist Complete Right  Result Date: 09/01/2018 CLINICAL DATA:  78 y/o  F; right wrist pain after fall. EXAM: RIGHT WRIST - COMPLETE 3+ VIEW COMPARISON:  None. FINDINGS: There is no evidence of fracture or dislocation. There is no evidence of arthropathy or other focal bone abnormality. Soft tissues are unremarkable. IMPRESSION: No acute fracture or dislocation identified. Electronically Signed   By: Kristine Garbe M.D.   On: 09/01/2018 00:57   Ct Head Wo Contrast  Result Date: 09/01/2018 CLINICAL DATA:  Today.  Large forehead swelling. EXAM: CT HEAD WITHOUT CONTRAST CT CERVICAL SPINE WITHOUT CONTRAST TECHNIQUE: Multidetector CT imaging of the head and cervical spine was performed following the standard protocol without intravenous contrast. Multiplanar CT image reconstructions of the cervical spine were also generated. COMPARISON:  Head CT without contrast 06/18/2011 FINDINGS: CT HEAD FINDINGS Brain: Cerebral volume is within normal limits for age. There is chronic subcortical white matter hypodensity at the anterior left operculum which is stable (series 4, image 22). Mild chronic overlying cortical encephalomalacia. Otherwise normal Gray-white matter differentiation throughout the brain. No midline shift, ventriculomegaly, mass effect, evidence of mass lesion, intracranial hemorrhage or evidence of cortically based acute infarction. Vascular: Mild for age Calcified atherosclerosis at the skull base. No suspicious intracranial vascular hyperdensity. Skull: Stable and intact. Sinuses/Orbits: Mild frontoethmoidal fluid and bubbly opacity. Mild maxillary mucosal thickening. Other paranasal sinuses are well pneumatized. There is trace bilateral mastoid fluid. Other: Broad-based right side scalp hematoma up to 8 millimeters in thickness. Hematoma tracks anteriorly to the right forehead. New left forehead soft tissue scarring since 2013. Orbits soft tissues appears stable and negative. Mild right premalar soft tissue stranding. CT CERVICAL SPINE FINDINGS  Alignment: Straightening of cervical lordosis. Cervicothoracic junction alignment is within normal limits. Bilateral posterior element alignment is within normal limits. Skull base and vertebrae: Visualized skull base is intact. No atlanto-occipital dissociation. Ankylosis of the left C2-C3 posterior elements. Interbody ankylosis at C5-C6, appears acquired rather than congenital. Developing ankylosis at C7-T1 including the left posterior element. No acute osseous abnormality  identified. Soft tissues and spinal canal: No prevertebral fluid or swelling. No visible canal hematoma. Asymmetric soft tissue stranding in the right neck and supraclavicular fossa (series 9, image 57). No discrete hematoma. Partially retropharyngeal course of the carotids, normal variant. Disc levels: Widespread cervical spine degeneration. Mild spinal stenosis at least at C3-C4 and C6-C7. Upper chest: Visible upper thoracic levels appear intact. The right neck soft tissue swelling continues to the anterior right chest wall. IMPRESSION: 1. Right scalp hematoma without skull fracture. 2. Stable since 2013 non contrast CT appearance of the brain. Minimal encephalomalacia at the anterior left operculum. 3. Probably posttraumatic soft tissue stranding/contusion in the right neck, but no discrete neck hematoma. 4. No acute traumatic injury identified in the cervical spine. 5. 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 Electronically Signed   By: Genevie Ann M.D.   On: 09/01/2018 00:03   Ct Cervical Spine Wo Contrast  Result Date: 09/01/2018 CLINICAL DATA:  Today. Large forehead swelling. EXAM: CT HEAD WITHOUT CONTRAST CT CERVICAL SPINE WITHOUT CONTRAST TECHNIQUE: Multidetector CT imaging of the head and cervical spine was performed following the standard protocol without intravenous contrast. Multiplanar CT image reconstructions of the cervical spine were also generated. COMPARISON:  Head CT without contrast 06/18/2011 FINDINGS: CT HEAD FINDINGS Brain: Cerebral volume is within normal limits for age. There is chronic subcortical white matter hypodensity at the anterior left operculum which is stable (series 4, image 22). Mild chronic overlying cortical encephalomalacia. Otherwise normal Gray-white matter differentiation throughout the brain. No midline shift, ventriculomegaly, mass effect, evidence of mass lesion, intracranial hemorrhage or evidence of cortically based acute infarction. Vascular:  Mild for age Calcified atherosclerosis at the skull base. No suspicious intracranial vascular hyperdensity. Skull: Stable and intact. Sinuses/Orbits: Mild frontoethmoidal fluid and bubbly opacity. Mild maxillary mucosal thickening. Other paranasal sinuses are well pneumatized. There is trace bilateral mastoid fluid. Other: Broad-based right side scalp hematoma up to 8 millimeters in thickness. Hematoma tracks anteriorly to the right forehead. New left forehead soft tissue scarring since 2013. Orbits soft tissues appears stable and negative. Mild right premalar soft tissue stranding. CT CERVICAL SPINE FINDINGS Alignment: Straightening of cervical lordosis. Cervicothoracic junction alignment is within normal limits. Bilateral posterior element alignment is within normal limits. Skull base and vertebrae: Visualized skull base is intact. No atlanto-occipital dissociation. Ankylosis of the left C2-C3 posterior elements. Interbody ankylosis at C5-C6, appears acquired rather than congenital. Developing ankylosis at C7-T1 including the left posterior element. No acute osseous abnormality identified. Soft tissues and spinal canal: No prevertebral fluid or swelling. No visible canal hematoma. Asymmetric soft tissue stranding in the right neck and supraclavicular fossa (series 9, image 57). No discrete hematoma. Partially retropharyngeal course of the carotids, normal variant. Disc levels: Widespread cervical spine degeneration. Mild spinal stenosis at least at C3-C4 and C6-C7. Upper chest: Visible upper thoracic levels appear intact. The right neck soft tissue swelling continues to the anterior right chest wall. IMPRESSION: 1. Right scalp hematoma without skull fracture. 2. Stable since 2013 non contrast CT appearance of the brain. Minimal encephalomalacia at the anterior left operculum. 3. Probably posttraumatic soft tissue stranding/contusion in the  right neck, but no discrete neck hematoma. 4. No acute traumatic injury  identified in the cervical spine. 5. 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 Electronically Signed   By: Genevie Ann M.D.   On: 09/01/2018 00:03   US Renal  Result Date: 09/01/2018 CLINICAL DATA:  Acute renal failure EXAM: RENAL / URINARY TRACT ULTRASOUND COMPLETE COMPARISON:  None. FINDINGS: Right Kidney: Renal measurements: 9.3 x 3.8 x 4.2 cm = volume: 80 mL. Cortical thinning and mild lobulation. Negative for mass or hydronephrosis. Left Kidney: Renal measurements: 8.5 x 4.8 x 4.1 cm = volume: 86 mL. Cortical thinning and mild lobulation. Negative for mass or hydronephrosis. Bladder: Appears normal for degree of bladder distention. IMPRESSION: 1. Symmetric renal atrophy. 2. No hydronephrosis or other reversible finding. Electronically Signed   By: Monte Fantasia M.D.   On: 09/01/2018 08:27   Dg Pelvis Portable  Result Date: 08/31/2018 CLINICAL DATA:  Fall 2 days ago with pelvic pain, initial encounter EXAM: PORTABLE PELVIS 1-2 VIEWS COMPARISON:  None. FINDINGS: Degenerative changes of the lumbar spine are seen. Degenerative changes of the hip joints are noted bilaterally. No soft tissue abnormality is seen. No fractures are seen. IMPRESSION: No acute abnormality noted. Electronically Signed   By: Inez Catalina M.D.   On: 08/31/2018 23:57   Dg Chest Port 1 View  Result Date: 09/03/2018 CLINICAL DATA:  Respiratory failure EXAM: PORTABLE CHEST 1 VIEW COMPARISON:  08/31/2018 FINDINGS: Cardiac shadow is stable. Aortic calcifications are seen. Mild bibasilar atelectasis is noted. No focal confluent infiltrate is seen. No bony abnormality is noted. IMPRESSION: Mild bibasilar atelectasis. Electronically Signed   By: Inez Catalina M.D.   On: 09/03/2018 07:17   Dg Chest Port 1 View  Result Date: 08/31/2018 CLINICAL DATA:  Recent fall with chest pain, initial encounter EXAM: PORTABLE CHEST 1 VIEW COMPARISON:  None. FINDINGS: Cardiac shadow is at the upper limits of normal in size. Aortic  calcifications are seen. The lungs are well aerated without focal infiltrate or sizable effusion. No bony abnormality is seen. IMPRESSION: No acute abnormality noted. Electronically Signed   By: Inez Catalina M.D.   On: 08/31/2018 23:58   Dg Knee Right Port  Result Date: 08/31/2018 CLINICAL DATA:  Recent fall with right knee pain, initial encounter EXAM: PORTABLE RIGHT KNEE - 2 VIEW COMPARISON:  None. FINDINGS: Degenerative changes of the right knee joint are seen. No sizable effusion is noted. No acute fracture or dislocation is seen. IMPRESSION: Degenerative change without acute abnormality. Electronically Signed   By: Inez Catalina M.D.   On: 08/31/2018 23:57   US Abdomen Limited Ruq  Result Date: 09/01/2018 CLINICAL DATA:  Abnormal liver function tests EXAM: ULTRASOUND ABDOMEN LIMITED RIGHT UPPER QUADRANT COMPARISON:  None. FINDINGS: Gallbladder: History of cholecystectomy. Shadowing gas is seen in the gallbladder fossa. Common bile duct: Diameter: 5 mm.  Where visualized, no filling defect. Liver: Borderline starry sky appearance which is likely technical in this case. No evidence of mass lesion. Portal vein is patent on color Doppler imaging with normal direction of blood flow towards the liver. IMPRESSION: Negative right upper quadrant ultrasound after cholecystectomy. Electronically Signed   By: Monte Fantasia M.D.   On: 09/01/2018 04:15      Subjective: -Complains of some soreness at her ulcer site Discharge Exam: Vitals:   09/11/18 0441 09/11/18 1831 09/11/18 2048 09/12/18 0435  BP: 128/74 (!) 103/58 103/63 111/67  Pulse: 68 (!) 53 (!) 58 61  Resp: 19 19 18  17  Temp: 97.9 F (36.6 C) 97.7 F (36.5 C) 98.3 F (36.8 C) 98.1 F (36.7 C)  TempSrc: Oral Oral Oral Oral  SpO2: 98% 97% 97% 97%  Weight:      Height:        General: Pt is alert, awake, not in acute distress Cardiovascular: RRR, S1/S2 +, no rubs, no gallops Respiratory: CTA bilaterally, no wheezing, no  rhonchi Abdominal: Soft, NT, ND, bowel sounds + Extremities: no edema, no cyanosis Skin: 3 x 4 cm sacral decubitus ulcer   The results of significant diagnostics from this hospitalization (including imaging, microbiology, ancillary and laboratory) are listed below for reference.     Microbiology: Recent Results (from the past 240 hour(s))  Urine culture     Status: Abnormal   Collection Time: 09/07/18  7:45 PM   Specimen: Urine, Random  Result Value Ref Range Status   Specimen Description URINE, RANDOM  Final   Special Requests   Final    NONE Performed at [facility], [address] [place], [place] [address]    Culture MULTIPLE SPECIES PRESENT, SUGGEST RECOLLECTION (A)  Final   Report Status 09/08/2018 FINAL  Final  SARS Coronavirus 2 (CEPHEID - Performed in [place] hospital lab), Hosp Order     Status: None   Collection Time: 09/07/18  9:32 PM   Specimen: Nasopharyngeal Swab  Result Value Ref Range Status   SARS Coronavirus 2 NEGATIVE NEGATIVE Final    Comment: (NOTE) If result is NEGATIVE SARS-CoV-2 target nucleic acids are NOT DETECTED. The SARS-CoV-2 RNA is generally detectable in upper and lower  respiratory specimens during the acute phase of infection. The lowest  concentration of SARS-CoV-2 viral copies this assay can detect is 250  copies / mL. A negative result does not preclude SARS-CoV-2 infection  and should not be used as the sole basis for treatment or other  patient management decisions.  A negative result may occur with  improper specimen collection / handling, submission of specimen other  than nasopharyngeal swab, presence of viral mutation(s) within the  areas targeted by this assay, and inadequate number of viral copies  (<250 copies / mL). A negative result must be combined with clinical  observations, patient history, and epidemiological information. If result is POSITIVE SARS-CoV-2 target nucleic acids are DETECTED. The SARS-CoV-2 RNA  is generally detectable in upper and lower  respiratory specimens dur ing the acute phase of infection.  Positive  results are indicative of active infection with SARS-CoV-2.  Clinical  correlation with patient history and other diagnostic information is  necessary to determine patient infection status.  Positive results do  not rule out bacterial infection or co-infection with other viruses. If result is PRESUMPTIVE POSTIVE SARS-CoV-2 nucleic acids MAY BE PRESENT.   A presumptive positive result was obtained on the submitted specimen  and confirmed on repeat testing.  While 2019 novel coronavirus  (SARS-CoV-2) nucleic acids may be present in the submitted sample  additional confirmatory testing may be necessary for epidemiological  and / or clinical management purposes  to differentiate between  SARS-CoV-2 and other Sarbecovirus currently known to infect humans.  If clinically indicated additional testing with an alternate test  methodology [phone]) is advised. The SARS-CoV-2 RNA is generally  detectable in upper and lower respiratory sp ecimens during the acute  phase of infection. The expected result is Negative. Fact Sheet for Patients:  StrictlyIdeas.no Fact Sheet for Healthcare Providers: BankingDealers.co.za This test is not yet approved or cleared by  the [employer] and has been authorized for detection and/or diagnosis of SARS-CoV-2 by FDA under an Emergency Use Authorization (EUA).  This EUA will remain in effect (meaning this test can be used) for the duration of the COVID-19 declaration under Section 564(b)(1) of the Act, 21 U.S.C. section 360bbb-3(b)(1), unless the authorization is terminated or revoked sooner. Performed at [facility], [place] [address], [place], [place] [address]   Novel Coronavirus, NAA (hospital order; send-out to ref lab)     Status: None   Collection Time: 09/09/18 11:40 AM   Specimen:  Nasopharyngeal Swab; Respiratory  Result Value Ref Range Status   SARS-CoV-2, NAA NOT DETECTED NOT DETECTED Final    Comment: (NOTE) This test was developed and its performance characteristics determined by [employer]. This test has not been FDA cleared or approved. This test has been authorized by FDA under an Emergency Use Authorization (EUA). This test is only authorized for the duration of time the declaration that circumstances exist justifying the authorization of the emergency use of in vitro diagnostic tests for detection of SARS-CoV-2 virus and/or diagnosis of COVID-19 infection under section 564(b)(1) of the Act, 21 U.S.C. 588FOY-7(X)(4), unless the authorization is terminated or revoked sooner. When diagnostic testing is negative, the possibility of a false negative result should be considered in the context of a patient's recent exposures and the presence of clinical signs and symptoms consistent with COVID-19. An individual without symptoms of COVID-19 and who is not shedding SARS-CoV-2 virus would expect to have a negative (not detected) result in this assay. Performed  At: [facility] [address] [place], [place] [id number] Rush Farmer MD MC:[id number]    [place]  Final    Comment: Performed at [facility], [place] [address] [place], [place] [address]     Labs: BNP (last 3 results) No results for input(s): BNP in the last 8760 hours. Basic Metabolic Panel: Recent Labs  Lab 09/07/18 1607 09/08/18 0727  NA 136 140  K 3.3* 3.8  CL 100 104  CO2 25 25  GLUCOSE 229* 177*  BUN 21 16  CREATININE 0.92 0.82  CALCIUM 8.6* 8.4*   Liver Function Tests: Recent Labs  Lab 09/08/18 0727  AST 48*  ALT 87*  ALKPHOS 199*  BILITOT 1.1  PROT 5.5*  ALBUMIN 2.3*   No results for input(s): LIPASE, AMYLASE in the last 168 hours. No results for input(s): AMMONIA in the last 168 hours. CBC: Recent Labs  Lab  09/07/18 1607 09/08/18 0727  WBC 8.4 9.1  NEUTROABS 6.1  --   HGB 13.0 13.6  HCT 39.0 40.5  MCV 95.1 94.2  PLT 175 187   Cardiac Enzymes: No results for input(s): CKTOTAL, CKMB, CKMBINDEX, TROPONINI in the last 168 hours. BNP: Invalid input(s): POCBNP CBG: Recent Labs  Lab 09/11/18 0758 09/11/18 1240 09/11/18 1711 09/11/18 2223 09/12/18 0846  GLUCAP 126* 167* 220* 180* 166*   D-Dimer No results for input(s): DDIMER in the last 72 hours. Hgb A1c No results for input(s): HGBA1C in the last 72 hours. Lipid Profile No results for input(s): CHOL, HDL, LDLCALC, TRIG, CHOLHDL, LDLDIRECT in the last 72 hours. Thyroid function studies No results for input(s): TSH, T4TOTAL, T3FREE, THYROIDAB in the last 72 hours.  Invalid input(s): FREET3 Anemia work up No results for input(s): VITAMINB12, FOLATE, FERRITIN, TIBC, IRON, RETICCTPCT in the last 72 hours. Urinalysis    Component Value Date/Time   COLORURINE YELLOW 09/07/2018 1956  APPEARANCEUR CLEAR 09/07/2018 1956   LABSPEC 1.004 (L) 09/07/2018 1956   PHURINE 6.0 09/07/2018 1956   GLUCOSEU NEGATIVE 09/07/2018 1956   HGBUR MODERATE (A) 09/07/2018 1956   BILIRUBINUR NEGATIVE 09/07/2018 1956   KETONESUR NEGATIVE 09/07/2018 1956   PROTEINUR NEGATIVE 09/07/2018 1956   UROBILINOGEN 0.2 12/29/2011 1540   NITRITE NEGATIVE 09/07/2018 1956   LEUKOCYTESUR LARGE (A) 09/07/2018 1956   Sepsis Labs Invalid input(s): PROCALCITONIN,  WBC,  LACTICIDVEN Microbiology Recent Results (from the past 240 hour(s))  Urine culture     Status: Abnormal   Collection Time: 09/07/18  7:45 PM   Specimen: Urine, Random  Result Value Ref Range Status   Specimen Description URINE, RANDOM  Final   Special Requests   Final    NONE Performed at [facility], [place] [address] [place], [place] [address]    Culture MULTIPLE SPECIES PRESENT, SUGGEST RECOLLECTION (A)  Final   Report Status 09/08/2018 FINAL  Final  SARS Coronavirus 2 (CEPHEID -  Performed in [place] hospital lab), Hosp Order     Status: None   Collection Time: 09/07/18  9:32 PM   Specimen: Nasopharyngeal Swab  Result Value Ref Range Status   SARS Coronavirus 2 NEGATIVE NEGATIVE Final    Comment: (NOTE) If result is NEGATIVE SARS-CoV-2 target nucleic acids are NOT DETECTED. The SARS-CoV-2 RNA is generally detectable in upper and lower  respiratory specimens during the acute phase of infection. The lowest  concentration of SARS-CoV-2 viral copies this assay can detect is 250  copies / mL. A negative result does not preclude SARS-CoV-2 infection  and should not be used as the sole basis for treatment or other  patient management decisions.  A negative result may occur with  improper specimen collection / handling, submission of specimen other  than nasopharyngeal swab, presence of viral mutation(s) within the  areas targeted by this assay, and inadequate number of viral copies  (<250 copies / mL). A negative result must be combined with clinical  observations, patient history, and epidemiological information. If result is POSITIVE SARS-CoV-2 target nucleic acids are DETECTED. The SARS-CoV-2 RNA is generally detectable in upper and lower  respiratory specimens dur ing the acute phase of infection.  Positive  results are indicative of active infection with SARS-CoV-2.  Clinical  correlation with patient history and other diagnostic information is  necessary to determine patient infection status.  Positive results do  not rule out bacterial infection or co-infection with other viruses. If result is PRESUMPTIVE POSTIVE SARS-CoV-2 nucleic acids MAY BE PRESENT.   A presumptive positive result was obtained on the submitted specimen  and confirmed on repeat testing.  While 2019 novel coronavirus  (SARS-CoV-2) nucleic acids may be present in the submitted sample  additional confirmatory testing may be necessary for epidemiological  and / or clinical management  purposes  to differentiate between  SARS-CoV-2 and other Sarbecovirus currently known to infect humans.  If clinically indicated additional testing with an alternate test  methodology [phone]) is advised. The SARS-CoV-2 RNA is generally  detectable in upper and lower respiratory sp ecimens during the acute  phase of infection. The expected result is Negative. Fact Sheet for Patients:  StrictlyIdeas.no Fact Sheet for Healthcare Providers: BankingDealers.co.za This test is not yet approved or cleared by the [place] FDA and has been authorized for detection and/or diagnosis of SARS-CoV-2 by FDA under an Emergency Use Authorization (EUA).  This EUA will remain in effect (meaning this test can be used)  for the duration of the COVID-19 declaration under Section 564(b)(1) of the Act, 21 U.S.C. section 360bbb-3(b)(1), unless the authorization is terminated or revoked sooner. Performed at [facility], [place] [address] [place], [place] [address]   Novel Coronavirus, NAA (hospital order; send-out to ref lab)     Status: None   Collection Time: 09/09/18 11:40 AM   Specimen: Nasopharyngeal Swab; Respiratory  Result Value Ref Range Status   SARS-CoV-2, NAA NOT DETECTED NOT DETECTED Final    Comment: (NOTE) This test was developed and its performance characteristics determined by [employer]. This test has not been FDA cleared or approved. This test has been authorized by FDA under an Emergency Use Authorization (EUA). This test is only authorized for the duration of time the declaration that circumstances exist justifying the authorization of the emergency use of in vitro diagnostic tests for detection of SARS-CoV-2 virus and/or diagnosis of COVID-19 infection under section 564(b)(1) of the Act, 21 U.S.C. 604VWU-9(W)(1), unless the authorization is terminated or revoked sooner. When diagnostic testing is negative, the  possibility of a false negative result should be considered in the context of a patient's recent exposures and the presence of clinical signs and symptoms consistent with COVID-19. An individual without symptoms of COVID-19 and who is not shedding SARS-CoV-2 virus would expect to have a negative (not detected) result in this assay. Performed  At: [facility] [address] [place], [place] [id number] Rush Farmer MD AO:[id number]    [place]  Final    Comment: Performed at [facility], [place] [address], [place], [place] [address]     Time coordinating discharge: 52mins  SIGNED:   Domenic Polite, MD  [facility] 09/12/2018, 11:37 AM   If 7PM-7AM, please contact night-coverage [email or web address]

## 2018-09-12 NOTE — Progress Notes (Signed)
Patient discharged to [facility], SNF. Patient report phoned to [facility]. Patient belongings and AVS summary transported with patient via [place].  Patient  transported off unit by [facility] at [phone].

## 2018-09-12 NOTE — Progress Notes (Signed)
Physical Therapy Treatment Patient Details Name: Teresa Peck MRN: [id number] DOB: [birth year] Today's Date: 09/12/2018    History of Present Illness 78 year old with PMH including hypertension, hyperlipidemia, Dm2 w neuropathy, CAD , CHF , PAD, Anemia Patient discharged from here recently, went home and was unable to care for herself or get out of chair.    PT Comments    Pt was seen for mobility training with there exercises including bridging to work on initiation of standing from bed level.  Her plan remains SNF and with her limited tolerance for gait is still quite appropriate.  Follow acutely through dc to progress the gait and standing endurance with RW, and work on control of RLE with respect to pain tolerance.      Follow Up Recommendations  SNF     Equipment Recommendations  None recommended by PT    Recommendations for Other Services       Precautions / Restrictions Precautions Precautions: Fall Restrictions Weight Bearing Restrictions: No    Mobility  Bed Mobility Overal bed mobility: Needs Assistance             General bed mobility comments: pt is requiring cues for scooting up in the bed including positioning legs to help  Transfers                 General transfer comment: declined  Ambulation/Gait                 Stairs             Wheelchair Mobility    Modified Rankin (Stroke Patients Only)       Balance                                            Cognition Arousal/Alertness: Awake/alert Behavior During Therapy: WFL for tasks assessed/performed Overall Cognitive Status: Impaired/Different from baseline Area of Impairment: Awareness;Problem solving                           Awareness: Intellectual Problem Solving: Slow processing;Requires verbal cues        Exercises General Exercises - Lower Extremity Ankle Circles/Pumps: AROM;Both;5 reps Quad Sets: AROM;Both;10  reps Gluteal Sets: AROM;Both;10 reps Heel Slides: AROM;Both;10 reps Hip ABduction/ADduction: AROM;Both;10 reps Straight Leg Raises: AROM;Both;10 reps Hip Flexion/Marching: AROM;Both;10 reps    General Comments        Pertinent Vitals/Pain Pain Assessment: Faces Faces Pain Scale: Hurts little more Pain Location: R thigh and legt Pain Descriptors / Indicators: Aching Pain Intervention(s): Limited activity within patient's tolerance;Monitored during session;Repositioned    Home Living                      Prior Function            PT Goals (current goals can now be found in the care plan section) Acute Rehab PT Goals Patient Stated Goal: to go to rehab Progress towards PT goals: Progressing toward goals    Frequency    Min 3X/week      PT Plan Current plan remains appropriate    Co-evaluation              AM-PAC PT "6 Clicks" Mobility   Outcome Measure  Help needed turning from your back to your side while in a flat bed without  using bedrails?: A Little Help needed moving from lying on your back to sitting on the side of a flat bed without using bedrails?: A Little Help needed moving to and from a bed to a chair (including a wheelchair)?: A Little Help needed standing up from a chair using your arms (e.g., wheelchair or bedside chair)?: A Little Help needed to walk in hospital room?: A Little Help needed climbing 3-5 steps with a railing? : A Lot 6 Click Score: 17    End of Session   Activity Tolerance: Patient tolerated treatment well;Patient limited by fatigue Patient left: in bed;with call bell/phone within reach Nurse Communication: Mobility status PT Visit Diagnosis: Unsteadiness on feet (R26.81);Muscle weakness (generalized) (M62.81);Difficulty in walking, not elsewhere classified (R26.2);Pain;History of falling (Z91.81) Pain - Right/Left: Right Pain - part of body: Hip;Knee     Time: 6226-3335 PT Time Calculation (min) (ACUTE ONLY):  15 min  Charges:  $Therapeutic Exercise: 8-22 mins                    Ramond Dial 09/12/2018, 3:10 PM   Mee Hives, PT MS Acute Rehab Dept. Number: [place]

## 2018-09-12 NOTE — TOC Progression Note (Signed)
Transition of Care [facility]) - Progression Note    Patient Details  Name: Teresa Peck MRN: [id number] Date of Birth: September 12, 1940  Transition of Care [facility]) CM/SW [place], [place] Phone Number: 09/12/2018, 8:54 AM  Clinical Narrative:    Per [place] liaison pt to receive authorization determination today from [facility]. Await confirmation. Requested updated dc summary from attending MD.    Expected Discharge Plan: [place] Barriers to Discharge: Continued Medical Work up, Ship broker  Expected Discharge Plan and Services Expected Discharge Plan: [place] In-house Referral: Clinical Social Work Discharge Planning Services: CM Consult Post Acute Care Choice: [place] arrangements for the past 2 months: Single Family Home   Social Determinants of Health (SDOH) Interventions    Readmission Risk Interventions No flowsheet data found.

## 2018-09-18 DIAGNOSIS — L89310 Pressure ulcer of right buttock, unstageable: Secondary | ICD-10-CM | POA: Diagnosis not present

## 2018-09-23 DIAGNOSIS — M25562 Pain in left knee: Secondary | ICD-10-CM | POA: Diagnosis not present

## 2018-09-23 DIAGNOSIS — R5381 Other malaise: Secondary | ICD-10-CM | POA: Diagnosis not present

## 2018-09-23 DIAGNOSIS — R262 Difficulty in walking, not elsewhere classified: Secondary | ICD-10-CM | POA: Diagnosis not present

## 2018-09-23 DIAGNOSIS — M25561 Pain in right knee: Secondary | ICD-10-CM | POA: Diagnosis not present

## 2018-09-25 DIAGNOSIS — R300 Dysuria: Secondary | ICD-10-CM | POA: Diagnosis not present

## 2018-09-25 DIAGNOSIS — K5900 Constipation, unspecified: Secondary | ICD-10-CM | POA: Diagnosis not present

## 2018-09-26 DIAGNOSIS — E785 Hyperlipidemia, unspecified: Secondary | ICD-10-CM | POA: Diagnosis not present

## 2018-09-26 DIAGNOSIS — I2510 Atherosclerotic heart disease of native coronary artery without angina pectoris: Secondary | ICD-10-CM | POA: Diagnosis not present

## 2018-09-26 DIAGNOSIS — I482 Chronic atrial fibrillation, unspecified: Secondary | ICD-10-CM | POA: Diagnosis not present

## 2018-09-26 DIAGNOSIS — I5022 Chronic systolic (congestive) heart failure: Secondary | ICD-10-CM | POA: Diagnosis not present

## 2018-09-26 DIAGNOSIS — R296 Repeated falls: Secondary | ICD-10-CM | POA: Diagnosis not present

## 2018-09-30 ENCOUNTER — Other Ambulatory Visit: Payer: Self-pay | Admitting: *Deleted

## 2018-09-30 DIAGNOSIS — L89896 Pressure-induced deep tissue damage of other site: Secondary | ICD-10-CM | POA: Diagnosis not present

## 2018-09-30 DIAGNOSIS — L89890 Pressure ulcer of other site, unstageable: Secondary | ICD-10-CM | POA: Diagnosis not present

## 2018-09-30 DIAGNOSIS — I2510 Atherosclerotic heart disease of native coronary artery without angina pectoris: Secondary | ICD-10-CM | POA: Diagnosis not present

## 2018-09-30 DIAGNOSIS — E1140 Type 2 diabetes mellitus with diabetic neuropathy, unspecified: Secondary | ICD-10-CM | POA: Diagnosis not present

## 2018-09-30 DIAGNOSIS — L89312 Pressure ulcer of right buttock, stage 2: Secondary | ICD-10-CM | POA: Diagnosis not present

## 2018-09-30 DIAGNOSIS — I482 Chronic atrial fibrillation, unspecified: Secondary | ICD-10-CM | POA: Diagnosis not present

## 2018-09-30 DIAGNOSIS — I110 Hypertensive heart disease with heart failure: Secondary | ICD-10-CM | POA: Diagnosis not present

## 2018-09-30 DIAGNOSIS — I5022 Chronic systolic (congestive) heart failure: Secondary | ICD-10-CM | POA: Diagnosis not present

## 2018-09-30 DIAGNOSIS — Z4800 Encounter for change or removal of nonsurgical wound dressing: Secondary | ICD-10-CM | POA: Diagnosis not present

## 2018-09-30 NOTE — Patient Outreach (Addendum)
[facility]) Care Management  09/30/2018  Teresa Peck 10-21-1940 [id number]   Subjective: Telephone call to patient's home number, spoke with patient, and HIPAA verified.  Discussed [facility] Care Management [employer] Medicare EMMI General Discharge Red Flag Alert follow up, patient voiced understanding, and is in agreement to follow up.   Patient states she is doing well and requested this RNCM complete assessment with daugther-in-law / designated party release Caren Griffins [place]).   Spoke with Caren Griffins, states patient did received EMMI automated call, and does has a follow up appointment scheduled with primary MD on 10/17/2018.   States primary MD will not be in the office on 10/17/2018 and they will call to reschedule appointment.   States she will also call to schedule appointment with cardiologist as soon as possible. Daughter-in-law states today [facility] will be do start of care visit. Daughter-in-law states patient is able to manage self care and has assistance as needed.  States she and patient's son live next door to patient and can assist as needed.  She states she and patient are aware of signs/ symptoms to report, how to reach provider if needed after hours, when to go to ED, and / or call 911.  She states patient does not have any education material, EMMI follow up, care coordination, care management, disease monitoring, transportation, community resource, or pharmacy needs at this time.  States she is very appreciative of the follow up and is in agreement to receive [place] Management information on patient's behalf.  Daughter-in-law in agreement for patient to receive EMMI follow up calls if needed.     Objective: Per KPN (Knowledge Performance Now, point of care tool) and chart review, patient hospitalized 09/07/2018 -09/12/2018 for generalized weakness, repeated falls, hospitalized 08/31/2018 - 09/05/2018 for Acute metabolic encephalopathy, Diabetic  ketoacidosis, Persistent atrial fibrillation,  Mild rhabdomyolysis, Acute renal failure.     Patient inpatient 09/12/2018 -09/28/2018 for rehab at [facility].   Patient also has a history of diabetes, hypertension, chronic systolic congestive heart failure, CAD, Atrial Fibrillation, and pressure injuries stage 2.      Assessment: Received [employer] Medicare EMMI General Discharge Red Flag Alert follow up referral on 09/30/2018.  Red Flag Alert Trigger, Day #1, patient answered no to the following question: Scheduled follow-up?    EMMI follow up completed and no further care management needs.       Plan: RNCM will send patient successful outreach letter, [facility] pamphlet, and magnet. RNCM will complete case closure due to follow up completed / no care management needs.        Willisha Sligar H. Annia Friendly, BSN, [place] [facility] Telephonic CM Phone: [phone] Fax: [phone]

## 2018-10-01 DIAGNOSIS — E1140 Type 2 diabetes mellitus with diabetic neuropathy, unspecified: Secondary | ICD-10-CM | POA: Diagnosis not present

## 2018-10-01 DIAGNOSIS — L89312 Pressure ulcer of right buttock, stage 2: Secondary | ICD-10-CM | POA: Diagnosis not present

## 2018-10-01 DIAGNOSIS — L89896 Pressure-induced deep tissue damage of other site: Secondary | ICD-10-CM | POA: Diagnosis not present

## 2018-10-01 DIAGNOSIS — I482 Chronic atrial fibrillation, unspecified: Secondary | ICD-10-CM | POA: Diagnosis not present

## 2018-10-01 DIAGNOSIS — I2510 Atherosclerotic heart disease of native coronary artery without angina pectoris: Secondary | ICD-10-CM | POA: Diagnosis not present

## 2018-10-01 DIAGNOSIS — Z4800 Encounter for change or removal of nonsurgical wound dressing: Secondary | ICD-10-CM | POA: Diagnosis not present

## 2018-10-01 DIAGNOSIS — I110 Hypertensive heart disease with heart failure: Secondary | ICD-10-CM | POA: Diagnosis not present

## 2018-10-01 DIAGNOSIS — L89890 Pressure ulcer of other site, unstageable: Secondary | ICD-10-CM | POA: Diagnosis not present

## 2018-10-01 DIAGNOSIS — I5022 Chronic systolic (congestive) heart failure: Secondary | ICD-10-CM | POA: Diagnosis not present

## 2018-10-02 DIAGNOSIS — L89312 Pressure ulcer of right buttock, stage 2: Secondary | ICD-10-CM | POA: Diagnosis not present

## 2018-10-02 DIAGNOSIS — L89896 Pressure-induced deep tissue damage of other site: Secondary | ICD-10-CM | POA: Diagnosis not present

## 2018-10-02 DIAGNOSIS — I5022 Chronic systolic (congestive) heart failure: Secondary | ICD-10-CM | POA: Diagnosis not present

## 2018-10-02 DIAGNOSIS — E1140 Type 2 diabetes mellitus with diabetic neuropathy, unspecified: Secondary | ICD-10-CM | POA: Diagnosis not present

## 2018-10-02 DIAGNOSIS — I110 Hypertensive heart disease with heart failure: Secondary | ICD-10-CM | POA: Diagnosis not present

## 2018-10-02 DIAGNOSIS — I2510 Atherosclerotic heart disease of native coronary artery without angina pectoris: Secondary | ICD-10-CM | POA: Diagnosis not present

## 2018-10-02 DIAGNOSIS — L89890 Pressure ulcer of other site, unstageable: Secondary | ICD-10-CM | POA: Diagnosis not present

## 2018-10-02 DIAGNOSIS — I482 Chronic atrial fibrillation, unspecified: Secondary | ICD-10-CM | POA: Diagnosis not present

## 2018-10-02 DIAGNOSIS — Z4800 Encounter for change or removal of nonsurgical wound dressing: Secondary | ICD-10-CM | POA: Diagnosis not present

## 2018-10-03 DIAGNOSIS — L89312 Pressure ulcer of right buttock, stage 2: Secondary | ICD-10-CM | POA: Diagnosis not present

## 2018-10-03 DIAGNOSIS — Z4800 Encounter for change or removal of nonsurgical wound dressing: Secondary | ICD-10-CM | POA: Diagnosis not present

## 2018-10-03 DIAGNOSIS — L89896 Pressure-induced deep tissue damage of other site: Secondary | ICD-10-CM | POA: Diagnosis not present

## 2018-10-03 DIAGNOSIS — I2510 Atherosclerotic heart disease of native coronary artery without angina pectoris: Secondary | ICD-10-CM | POA: Diagnosis not present

## 2018-10-03 DIAGNOSIS — I110 Hypertensive heart disease with heart failure: Secondary | ICD-10-CM | POA: Diagnosis not present

## 2018-10-03 DIAGNOSIS — I482 Chronic atrial fibrillation, unspecified: Secondary | ICD-10-CM | POA: Diagnosis not present

## 2018-10-03 DIAGNOSIS — I5022 Chronic systolic (congestive) heart failure: Secondary | ICD-10-CM | POA: Diagnosis not present

## 2018-10-03 DIAGNOSIS — L89890 Pressure ulcer of other site, unstageable: Secondary | ICD-10-CM | POA: Diagnosis not present

## 2018-10-03 DIAGNOSIS — E1140 Type 2 diabetes mellitus with diabetic neuropathy, unspecified: Secondary | ICD-10-CM | POA: Diagnosis not present

## 2018-10-06 DIAGNOSIS — E1140 Type 2 diabetes mellitus with diabetic neuropathy, unspecified: Secondary | ICD-10-CM | POA: Diagnosis not present

## 2018-10-06 DIAGNOSIS — Z4800 Encounter for change or removal of nonsurgical wound dressing: Secondary | ICD-10-CM | POA: Diagnosis not present

## 2018-10-06 DIAGNOSIS — I2510 Atherosclerotic heart disease of native coronary artery without angina pectoris: Secondary | ICD-10-CM | POA: Diagnosis not present

## 2018-10-06 DIAGNOSIS — L89890 Pressure ulcer of other site, unstageable: Secondary | ICD-10-CM | POA: Diagnosis not present

## 2018-10-06 DIAGNOSIS — L89896 Pressure-induced deep tissue damage of other site: Secondary | ICD-10-CM | POA: Diagnosis not present

## 2018-10-06 DIAGNOSIS — I110 Hypertensive heart disease with heart failure: Secondary | ICD-10-CM | POA: Diagnosis not present

## 2018-10-06 DIAGNOSIS — L89312 Pressure ulcer of right buttock, stage 2: Secondary | ICD-10-CM | POA: Diagnosis not present

## 2018-10-06 DIAGNOSIS — I5022 Chronic systolic (congestive) heart failure: Secondary | ICD-10-CM | POA: Diagnosis not present

## 2018-10-06 DIAGNOSIS — I482 Chronic atrial fibrillation, unspecified: Secondary | ICD-10-CM | POA: Diagnosis not present

## 2018-10-07 DIAGNOSIS — I2510 Atherosclerotic heart disease of native coronary artery without angina pectoris: Secondary | ICD-10-CM | POA: Diagnosis not present

## 2018-10-07 DIAGNOSIS — L89890 Pressure ulcer of other site, unstageable: Secondary | ICD-10-CM | POA: Diagnosis not present

## 2018-10-07 DIAGNOSIS — Z4800 Encounter for change or removal of nonsurgical wound dressing: Secondary | ICD-10-CM | POA: Diagnosis not present

## 2018-10-07 DIAGNOSIS — I482 Chronic atrial fibrillation, unspecified: Secondary | ICD-10-CM | POA: Diagnosis not present

## 2018-10-07 DIAGNOSIS — L89312 Pressure ulcer of right buttock, stage 2: Secondary | ICD-10-CM | POA: Diagnosis not present

## 2018-10-07 DIAGNOSIS — I110 Hypertensive heart disease with heart failure: Secondary | ICD-10-CM | POA: Diagnosis not present

## 2018-10-07 DIAGNOSIS — I5022 Chronic systolic (congestive) heart failure: Secondary | ICD-10-CM | POA: Diagnosis not present

## 2018-10-07 DIAGNOSIS — E1140 Type 2 diabetes mellitus with diabetic neuropathy, unspecified: Secondary | ICD-10-CM | POA: Diagnosis not present

## 2018-10-07 DIAGNOSIS — L89896 Pressure-induced deep tissue damage of other site: Secondary | ICD-10-CM | POA: Diagnosis not present

## 2018-10-08 DIAGNOSIS — I2510 Atherosclerotic heart disease of native coronary artery without angina pectoris: Secondary | ICD-10-CM | POA: Diagnosis not present

## 2018-10-08 DIAGNOSIS — E1140 Type 2 diabetes mellitus with diabetic neuropathy, unspecified: Secondary | ICD-10-CM | POA: Diagnosis not present

## 2018-10-08 DIAGNOSIS — I5022 Chronic systolic (congestive) heart failure: Secondary | ICD-10-CM | POA: Diagnosis not present

## 2018-10-08 DIAGNOSIS — I482 Chronic atrial fibrillation, unspecified: Secondary | ICD-10-CM | POA: Diagnosis not present

## 2018-10-08 DIAGNOSIS — L89312 Pressure ulcer of right buttock, stage 2: Secondary | ICD-10-CM | POA: Diagnosis not present

## 2018-10-08 DIAGNOSIS — I110 Hypertensive heart disease with heart failure: Secondary | ICD-10-CM | POA: Diagnosis not present

## 2018-10-08 DIAGNOSIS — Z4800 Encounter for change or removal of nonsurgical wound dressing: Secondary | ICD-10-CM | POA: Diagnosis not present

## 2018-10-08 DIAGNOSIS — L89896 Pressure-induced deep tissue damage of other site: Secondary | ICD-10-CM | POA: Diagnosis not present

## 2018-10-08 DIAGNOSIS — L89890 Pressure ulcer of other site, unstageable: Secondary | ICD-10-CM | POA: Diagnosis not present

## 2018-10-09 DIAGNOSIS — I110 Hypertensive heart disease with heart failure: Secondary | ICD-10-CM | POA: Diagnosis not present

## 2018-10-09 DIAGNOSIS — L89312 Pressure ulcer of right buttock, stage 2: Secondary | ICD-10-CM | POA: Diagnosis not present

## 2018-10-09 DIAGNOSIS — E1140 Type 2 diabetes mellitus with diabetic neuropathy, unspecified: Secondary | ICD-10-CM | POA: Diagnosis not present

## 2018-10-09 DIAGNOSIS — I482 Chronic atrial fibrillation, unspecified: Secondary | ICD-10-CM | POA: Diagnosis not present

## 2018-10-09 DIAGNOSIS — I2510 Atherosclerotic heart disease of native coronary artery without angina pectoris: Secondary | ICD-10-CM | POA: Diagnosis not present

## 2018-10-09 DIAGNOSIS — L89896 Pressure-induced deep tissue damage of other site: Secondary | ICD-10-CM | POA: Diagnosis not present

## 2018-10-09 DIAGNOSIS — I5022 Chronic systolic (congestive) heart failure: Secondary | ICD-10-CM | POA: Diagnosis not present

## 2018-10-09 DIAGNOSIS — Z4800 Encounter for change or removal of nonsurgical wound dressing: Secondary | ICD-10-CM | POA: Diagnosis not present

## 2018-10-09 DIAGNOSIS — L89890 Pressure ulcer of other site, unstageable: Secondary | ICD-10-CM | POA: Diagnosis not present

## 2018-10-13 DIAGNOSIS — L89896 Pressure-induced deep tissue damage of other site: Secondary | ICD-10-CM | POA: Diagnosis not present

## 2018-10-13 DIAGNOSIS — I2510 Atherosclerotic heart disease of native coronary artery without angina pectoris: Secondary | ICD-10-CM | POA: Diagnosis not present

## 2018-10-13 DIAGNOSIS — I482 Chronic atrial fibrillation, unspecified: Secondary | ICD-10-CM | POA: Diagnosis not present

## 2018-10-13 DIAGNOSIS — I110 Hypertensive heart disease with heart failure: Secondary | ICD-10-CM | POA: Diagnosis not present

## 2018-10-13 DIAGNOSIS — L89312 Pressure ulcer of right buttock, stage 2: Secondary | ICD-10-CM | POA: Diagnosis not present

## 2018-10-13 DIAGNOSIS — I5022 Chronic systolic (congestive) heart failure: Secondary | ICD-10-CM | POA: Diagnosis not present

## 2018-10-13 DIAGNOSIS — L89890 Pressure ulcer of other site, unstageable: Secondary | ICD-10-CM | POA: Diagnosis not present

## 2018-10-13 DIAGNOSIS — Z4800 Encounter for change or removal of nonsurgical wound dressing: Secondary | ICD-10-CM | POA: Diagnosis not present

## 2018-10-13 DIAGNOSIS — E1140 Type 2 diabetes mellitus with diabetic neuropathy, unspecified: Secondary | ICD-10-CM | POA: Diagnosis not present

## 2018-10-14 DIAGNOSIS — L89890 Pressure ulcer of other site, unstageable: Secondary | ICD-10-CM | POA: Diagnosis not present

## 2018-10-14 DIAGNOSIS — I482 Chronic atrial fibrillation, unspecified: Secondary | ICD-10-CM | POA: Diagnosis not present

## 2018-10-14 DIAGNOSIS — I5022 Chronic systolic (congestive) heart failure: Secondary | ICD-10-CM | POA: Diagnosis not present

## 2018-10-14 DIAGNOSIS — I110 Hypertensive heart disease with heart failure: Secondary | ICD-10-CM | POA: Diagnosis not present

## 2018-10-14 DIAGNOSIS — L89896 Pressure-induced deep tissue damage of other site: Secondary | ICD-10-CM | POA: Diagnosis not present

## 2018-10-14 DIAGNOSIS — E1140 Type 2 diabetes mellitus with diabetic neuropathy, unspecified: Secondary | ICD-10-CM | POA: Diagnosis not present

## 2018-10-14 DIAGNOSIS — Z4800 Encounter for change or removal of nonsurgical wound dressing: Secondary | ICD-10-CM | POA: Diagnosis not present

## 2018-10-14 DIAGNOSIS — I2510 Atherosclerotic heart disease of native coronary artery without angina pectoris: Secondary | ICD-10-CM | POA: Diagnosis not present

## 2018-10-14 DIAGNOSIS — L89312 Pressure ulcer of right buttock, stage 2: Secondary | ICD-10-CM | POA: Diagnosis not present

## 2018-10-15 DIAGNOSIS — Z4800 Encounter for change or removal of nonsurgical wound dressing: Secondary | ICD-10-CM | POA: Diagnosis not present

## 2018-10-15 DIAGNOSIS — E1140 Type 2 diabetes mellitus with diabetic neuropathy, unspecified: Secondary | ICD-10-CM | POA: Diagnosis not present

## 2018-10-15 DIAGNOSIS — I482 Chronic atrial fibrillation, unspecified: Secondary | ICD-10-CM | POA: Diagnosis not present

## 2018-10-15 DIAGNOSIS — L89890 Pressure ulcer of other site, unstageable: Secondary | ICD-10-CM | POA: Diagnosis not present

## 2018-10-15 DIAGNOSIS — L89312 Pressure ulcer of right buttock, stage 2: Secondary | ICD-10-CM | POA: Diagnosis not present

## 2018-10-15 DIAGNOSIS — I110 Hypertensive heart disease with heart failure: Secondary | ICD-10-CM | POA: Diagnosis not present

## 2018-10-15 DIAGNOSIS — L89896 Pressure-induced deep tissue damage of other site: Secondary | ICD-10-CM | POA: Diagnosis not present

## 2018-10-15 DIAGNOSIS — I5022 Chronic systolic (congestive) heart failure: Secondary | ICD-10-CM | POA: Diagnosis not present

## 2018-10-15 DIAGNOSIS — I2510 Atherosclerotic heart disease of native coronary artery without angina pectoris: Secondary | ICD-10-CM | POA: Diagnosis not present

## 2018-10-16 DIAGNOSIS — Z4800 Encounter for change or removal of nonsurgical wound dressing: Secondary | ICD-10-CM | POA: Diagnosis not present

## 2018-10-16 DIAGNOSIS — L89312 Pressure ulcer of right buttock, stage 2: Secondary | ICD-10-CM | POA: Diagnosis not present

## 2018-10-16 DIAGNOSIS — I482 Chronic atrial fibrillation, unspecified: Secondary | ICD-10-CM | POA: Diagnosis not present

## 2018-10-16 DIAGNOSIS — L89890 Pressure ulcer of other site, unstageable: Secondary | ICD-10-CM | POA: Diagnosis not present

## 2018-10-16 DIAGNOSIS — I5022 Chronic systolic (congestive) heart failure: Secondary | ICD-10-CM | POA: Diagnosis not present

## 2018-10-16 DIAGNOSIS — E1140 Type 2 diabetes mellitus with diabetic neuropathy, unspecified: Secondary | ICD-10-CM | POA: Diagnosis not present

## 2018-10-16 DIAGNOSIS — I110 Hypertensive heart disease with heart failure: Secondary | ICD-10-CM | POA: Diagnosis not present

## 2018-10-16 DIAGNOSIS — L89896 Pressure-induced deep tissue damage of other site: Secondary | ICD-10-CM | POA: Diagnosis not present

## 2018-10-16 DIAGNOSIS — I2510 Atherosclerotic heart disease of native coronary artery without angina pectoris: Secondary | ICD-10-CM | POA: Diagnosis not present

## 2018-10-21 DIAGNOSIS — L89312 Pressure ulcer of right buttock, stage 2: Secondary | ICD-10-CM | POA: Diagnosis not present

## 2018-10-21 DIAGNOSIS — L89896 Pressure-induced deep tissue damage of other site: Secondary | ICD-10-CM | POA: Diagnosis not present

## 2018-10-21 DIAGNOSIS — I110 Hypertensive heart disease with heart failure: Secondary | ICD-10-CM | POA: Diagnosis not present

## 2018-10-21 DIAGNOSIS — L89890 Pressure ulcer of other site, unstageable: Secondary | ICD-10-CM | POA: Diagnosis not present

## 2018-10-21 DIAGNOSIS — I2510 Atherosclerotic heart disease of native coronary artery without angina pectoris: Secondary | ICD-10-CM | POA: Diagnosis not present

## 2018-10-21 DIAGNOSIS — I5022 Chronic systolic (congestive) heart failure: Secondary | ICD-10-CM | POA: Diagnosis not present

## 2018-10-21 DIAGNOSIS — E1140 Type 2 diabetes mellitus with diabetic neuropathy, unspecified: Secondary | ICD-10-CM | POA: Diagnosis not present

## 2018-10-21 DIAGNOSIS — Z4800 Encounter for change or removal of nonsurgical wound dressing: Secondary | ICD-10-CM | POA: Diagnosis not present

## 2018-10-21 DIAGNOSIS — I482 Chronic atrial fibrillation, unspecified: Secondary | ICD-10-CM | POA: Diagnosis not present

## 2018-10-22 DIAGNOSIS — I482 Chronic atrial fibrillation, unspecified: Secondary | ICD-10-CM | POA: Diagnosis not present

## 2018-10-22 DIAGNOSIS — L89896 Pressure-induced deep tissue damage of other site: Secondary | ICD-10-CM | POA: Diagnosis not present

## 2018-10-22 DIAGNOSIS — E1140 Type 2 diabetes mellitus with diabetic neuropathy, unspecified: Secondary | ICD-10-CM | POA: Diagnosis not present

## 2018-10-22 DIAGNOSIS — I5022 Chronic systolic (congestive) heart failure: Secondary | ICD-10-CM | POA: Diagnosis not present

## 2018-10-22 DIAGNOSIS — L89312 Pressure ulcer of right buttock, stage 2: Secondary | ICD-10-CM | POA: Diagnosis not present

## 2018-10-22 DIAGNOSIS — L89890 Pressure ulcer of other site, unstageable: Secondary | ICD-10-CM | POA: Diagnosis not present

## 2018-10-22 DIAGNOSIS — I110 Hypertensive heart disease with heart failure: Secondary | ICD-10-CM | POA: Diagnosis not present

## 2018-10-22 DIAGNOSIS — I2510 Atherosclerotic heart disease of native coronary artery without angina pectoris: Secondary | ICD-10-CM | POA: Diagnosis not present

## 2018-10-22 DIAGNOSIS — Z4800 Encounter for change or removal of nonsurgical wound dressing: Secondary | ICD-10-CM | POA: Diagnosis not present

## 2018-10-28 DIAGNOSIS — I5022 Chronic systolic (congestive) heart failure: Secondary | ICD-10-CM | POA: Diagnosis not present

## 2018-10-28 DIAGNOSIS — L89896 Pressure-induced deep tissue damage of other site: Secondary | ICD-10-CM | POA: Diagnosis not present

## 2018-10-28 DIAGNOSIS — I2510 Atherosclerotic heart disease of native coronary artery without angina pectoris: Secondary | ICD-10-CM | POA: Diagnosis not present

## 2018-10-28 DIAGNOSIS — L89312 Pressure ulcer of right buttock, stage 2: Secondary | ICD-10-CM | POA: Diagnosis not present

## 2018-10-28 DIAGNOSIS — L89890 Pressure ulcer of other site, unstageable: Secondary | ICD-10-CM | POA: Diagnosis not present

## 2018-10-28 DIAGNOSIS — Z4800 Encounter for change or removal of nonsurgical wound dressing: Secondary | ICD-10-CM | POA: Diagnosis not present

## 2018-10-28 DIAGNOSIS — I110 Hypertensive heart disease with heart failure: Secondary | ICD-10-CM | POA: Diagnosis not present

## 2018-10-28 DIAGNOSIS — I482 Chronic atrial fibrillation, unspecified: Secondary | ICD-10-CM | POA: Diagnosis not present

## 2018-10-28 DIAGNOSIS — E1140 Type 2 diabetes mellitus with diabetic neuropathy, unspecified: Secondary | ICD-10-CM | POA: Diagnosis not present

## 2018-10-29 DIAGNOSIS — L89890 Pressure ulcer of other site, unstageable: Secondary | ICD-10-CM | POA: Diagnosis not present

## 2018-10-29 DIAGNOSIS — E1140 Type 2 diabetes mellitus with diabetic neuropathy, unspecified: Secondary | ICD-10-CM | POA: Diagnosis not present

## 2018-10-29 DIAGNOSIS — L89896 Pressure-induced deep tissue damage of other site: Secondary | ICD-10-CM | POA: Diagnosis not present

## 2018-10-29 DIAGNOSIS — I110 Hypertensive heart disease with heart failure: Secondary | ICD-10-CM | POA: Diagnosis not present

## 2018-10-29 DIAGNOSIS — I482 Chronic atrial fibrillation, unspecified: Secondary | ICD-10-CM | POA: Diagnosis not present

## 2018-10-29 DIAGNOSIS — I2510 Atherosclerotic heart disease of native coronary artery without angina pectoris: Secondary | ICD-10-CM | POA: Diagnosis not present

## 2018-10-29 DIAGNOSIS — L89312 Pressure ulcer of right buttock, stage 2: Secondary | ICD-10-CM | POA: Diagnosis not present

## 2018-10-29 DIAGNOSIS — Z4800 Encounter for change or removal of nonsurgical wound dressing: Secondary | ICD-10-CM | POA: Diagnosis not present

## 2018-10-29 DIAGNOSIS — I5022 Chronic systolic (congestive) heart failure: Secondary | ICD-10-CM | POA: Diagnosis not present

## 2018-11-03 DIAGNOSIS — Z23 Encounter for immunization: Secondary | ICD-10-CM | POA: Diagnosis not present

## 2018-11-03 DIAGNOSIS — N183 Chronic kidney disease, stage 3 (moderate): Secondary | ICD-10-CM | POA: Diagnosis not present

## 2018-11-03 DIAGNOSIS — M1000 Idiopathic gout, unspecified site: Secondary | ICD-10-CM | POA: Diagnosis not present

## 2018-11-03 DIAGNOSIS — R5382 Chronic fatigue, unspecified: Secondary | ICD-10-CM | POA: Diagnosis not present

## 2018-11-03 DIAGNOSIS — I2510 Atherosclerotic heart disease of native coronary artery without angina pectoris: Secondary | ICD-10-CM | POA: Diagnosis not present

## 2018-11-03 DIAGNOSIS — M810 Age-related osteoporosis without current pathological fracture: Secondary | ICD-10-CM | POA: Diagnosis not present

## 2018-11-03 DIAGNOSIS — I4891 Unspecified atrial fibrillation: Secondary | ICD-10-CM | POA: Diagnosis not present

## 2018-11-03 DIAGNOSIS — E1165 Type 2 diabetes mellitus with hyperglycemia: Secondary | ICD-10-CM | POA: Diagnosis not present

## 2018-11-03 DIAGNOSIS — E7800 Pure hypercholesterolemia, unspecified: Secondary | ICD-10-CM | POA: Diagnosis not present

## 2018-11-06 DIAGNOSIS — I482 Chronic atrial fibrillation, unspecified: Secondary | ICD-10-CM | POA: Diagnosis not present

## 2018-11-11 DIAGNOSIS — E1140 Type 2 diabetes mellitus with diabetic neuropathy, unspecified: Secondary | ICD-10-CM | POA: Diagnosis not present

## 2018-11-11 DIAGNOSIS — I482 Chronic atrial fibrillation, unspecified: Secondary | ICD-10-CM | POA: Diagnosis not present

## 2018-11-11 DIAGNOSIS — L89312 Pressure ulcer of right buttock, stage 2: Secondary | ICD-10-CM | POA: Diagnosis not present

## 2018-11-11 DIAGNOSIS — I2510 Atherosclerotic heart disease of native coronary artery without angina pectoris: Secondary | ICD-10-CM | POA: Diagnosis not present

## 2018-11-11 DIAGNOSIS — L89890 Pressure ulcer of other site, unstageable: Secondary | ICD-10-CM | POA: Diagnosis not present

## 2018-11-11 DIAGNOSIS — I5022 Chronic systolic (congestive) heart failure: Secondary | ICD-10-CM | POA: Diagnosis not present

## 2018-11-11 DIAGNOSIS — I110 Hypertensive heart disease with heart failure: Secondary | ICD-10-CM | POA: Diagnosis not present

## 2018-11-11 DIAGNOSIS — L89896 Pressure-induced deep tissue damage of other site: Secondary | ICD-10-CM | POA: Diagnosis not present

## 2018-11-11 DIAGNOSIS — Z4800 Encounter for change or removal of nonsurgical wound dressing: Secondary | ICD-10-CM | POA: Diagnosis not present

## 2018-11-13 DIAGNOSIS — E1121 Type 2 diabetes mellitus with diabetic nephropathy: Secondary | ICD-10-CM | POA: Diagnosis not present

## 2018-11-13 DIAGNOSIS — E7800 Pure hypercholesterolemia, unspecified: Secondary | ICD-10-CM | POA: Diagnosis not present

## 2018-11-13 DIAGNOSIS — R5382 Chronic fatigue, unspecified: Secondary | ICD-10-CM | POA: Diagnosis not present

## 2018-11-13 DIAGNOSIS — I4891 Unspecified atrial fibrillation: Secondary | ICD-10-CM | POA: Diagnosis not present

## 2018-11-13 DIAGNOSIS — E1165 Type 2 diabetes mellitus with hyperglycemia: Secondary | ICD-10-CM | POA: Diagnosis not present

## 2018-11-13 DIAGNOSIS — M810 Age-related osteoporosis without current pathological fracture: Secondary | ICD-10-CM | POA: Diagnosis not present

## 2018-11-13 DIAGNOSIS — I10 Essential (primary) hypertension: Secondary | ICD-10-CM | POA: Diagnosis not present

## 2018-11-18 DIAGNOSIS — N183 Chronic kidney disease, stage 3 (moderate): Secondary | ICD-10-CM | POA: Diagnosis not present

## 2018-11-18 DIAGNOSIS — I509 Heart failure, unspecified: Secondary | ICD-10-CM | POA: Diagnosis not present

## 2018-11-18 DIAGNOSIS — K5900 Constipation, unspecified: Secondary | ICD-10-CM | POA: Diagnosis not present

## 2018-11-18 DIAGNOSIS — I10 Essential (primary) hypertension: Secondary | ICD-10-CM | POA: Diagnosis not present

## 2018-11-18 DIAGNOSIS — E1121 Type 2 diabetes mellitus with diabetic nephropathy: Secondary | ICD-10-CM | POA: Diagnosis not present

## 2018-11-18 DIAGNOSIS — Z794 Long term (current) use of insulin: Secondary | ICD-10-CM | POA: Diagnosis not present

## 2018-11-18 DIAGNOSIS — E1122 Type 2 diabetes mellitus with diabetic chronic kidney disease: Secondary | ICD-10-CM | POA: Diagnosis not present

## 2018-11-18 DIAGNOSIS — I4891 Unspecified atrial fibrillation: Secondary | ICD-10-CM | POA: Diagnosis not present

## 2018-11-25 DIAGNOSIS — E1140 Type 2 diabetes mellitus with diabetic neuropathy, unspecified: Secondary | ICD-10-CM | POA: Diagnosis not present

## 2018-11-25 DIAGNOSIS — I5022 Chronic systolic (congestive) heart failure: Secondary | ICD-10-CM | POA: Diagnosis not present

## 2018-11-25 DIAGNOSIS — L89896 Pressure-induced deep tissue damage of other site: Secondary | ICD-10-CM | POA: Diagnosis not present

## 2018-11-25 DIAGNOSIS — L89890 Pressure ulcer of other site, unstageable: Secondary | ICD-10-CM | POA: Diagnosis not present

## 2018-11-25 DIAGNOSIS — I110 Hypertensive heart disease with heart failure: Secondary | ICD-10-CM | POA: Diagnosis not present

## 2018-11-25 DIAGNOSIS — Z4800 Encounter for change or removal of nonsurgical wound dressing: Secondary | ICD-10-CM | POA: Diagnosis not present

## 2018-11-25 DIAGNOSIS — I2510 Atherosclerotic heart disease of native coronary artery without angina pectoris: Secondary | ICD-10-CM | POA: Diagnosis not present

## 2018-11-25 DIAGNOSIS — L89312 Pressure ulcer of right buttock, stage 2: Secondary | ICD-10-CM | POA: Diagnosis not present

## 2018-11-25 DIAGNOSIS — I482 Chronic atrial fibrillation, unspecified: Secondary | ICD-10-CM | POA: Diagnosis not present

## 2018-12-06 DIAGNOSIS — I482 Chronic atrial fibrillation, unspecified: Secondary | ICD-10-CM | POA: Diagnosis not present

## 2018-12-25 NOTE — Progress Notes (Signed)
Cardiology Office Note   Date:  12/26/2018   ID:  Teresa Peck, DOB [birth year], MRN [id number]  PCP:  Deland Pretty, MD  Cardiologist:   Mertie Moores, MD   Chief Complaint  Patient presents with  . Coronary Artery Disease  . Atrial Fibrillation   Problem list: 1. Coronary artery disease: She has an occluded LAD. She status post stenting of her first diagonal 2. Hyperlipidemia 3. Chronic systolic congestive heart failure 4. Essential hypertension 5. 5. Diabetes mellitus - peripheral neuropathy    Teresa Peck is a 78 y.o. female who presents for follow-up of her coronary artery disease. She's been seen in the past by Dr. Rockey Situ. She was seen with her Daughter, Teresa Peck.  She had a cath in 2004 and had stenting of her 1st diag. Dr. Minna Antis has stopped some of her meds ( Coreg) also stopped allopurinol and Digoxin.   She denies any cp.  Breathing is ok.   Eats lots of Progresso soup.  She does not exercise.   Knows that she needs to walk more.  Has claudication with ambulation .   Aug. 30, 2016:   Doing well.  Complains of leg pain - was told that she had diabetic neuropathy Lots of leg swelling ,  Still eating lots of soup and canned foods.   Dec. 2 , 2016:  Breathing is good.  Leg Swelling is better  She thinks the Actos is causing fluid retention and weight gain .   May 17, 2015:  Has had her back surgery  ,.   Legs and back feel much better.  She was hypotensive prior to her back surgery and we held her coreg and Quinipril   Sept. 19, 2017:  Teresa Peck is seen back for a follow up visit. Has cut out her salt. Has lots 8 lbs.  Breathing is ok   Cannot tell that her HR is irregular.   Sept. 11, 2018:  Seen today for follow  Breathing is going well.   June 04, 2017:   Teresa Peck is seen today for follow-up of her coronary artery disease, chronic systolic congestive heart failure, atrial fibrillation.  She also has a history of  hyperlipidemia.  Has bilateral leg pain when she walks   February 26, 2018  Seen for follow  Up  Still has frequent episodes of leg pain, she admits that she does not walk enough. Her breathing seems to be okay.  She is not having any episodes of chest pain.  Nov. 6, 2020   Doing well. Still in Afib  Had a serious fall in July,  ? Syncope  Was in the hospital off and on through most of the month of July  Doing well , no syncope   Past Medical History:  Diagnosis Date  . Anemia   . Arthritis   . CAD (coronary artery disease)   . CAD (coronary artery disease)   . Cancer (Teresa Peck)    skin cancer  . CHF (congestive heart failure) ([place])   . Coronary artery disease    MI 2004-stent  . Diabetes mellitus   . Diabetic neuropathy (Teresa Peck)   . GERD (gastroesophageal reflux disease)    tums  . Gout   . Hiatal hernia   . Hyperlipidemia   . Hypertension   . PAD (peripheral artery disease) ([place])   . Tremor     Past Surgical History:  Procedure Laterality Date  . BILATERAL CORNEA IMPLANTS  2010  . CARDIAC CATHETERIZATION  stent post MI   . CHOLECYSTECTOMY    . CORONARY ANGIOPLASTY     2004  stent  . EYE SURGERY    . [place]   right  . LUMBAR LAMINECTOMY/DECOMPRESSION MICRODISCECTOMY N/A 04/13/2015   Procedure: L4-5 Decompression;  Surgeon: Marybelle Killings, MD;  Location: [place];  Service: Orthopedics;  Laterality: N/A;  Need RNFA     Current Outpatient Medications  Medication Sig Dispense Refill  . acetaminophen (TYLENOL) 325 MG tablet Take 2 tablets (650 mg total) by mouth every 6 (six) hours as needed for mild pain or headache.    . allopurinol (ZYLOPRIM) 300 MG tablet Take 300 mg by mouth daily.    Marland Kitchen apixaban (ELIQUIS) 5 MG TABS tablet Take 1 tablet (5 mg total) by mouth 2 (two) times daily. 60 tablet 5  . furosemide (LASIX) 40 MG tablet Take 40 mg by mouth every other day.     . gabapentin (NEURONTIN) 600 MG tablet Take 600 mg by mouth 2 (two) times daily.      .  insulin glargine (LANTUS) 100 unit/mL SOPN Inject 0.1 mLs (10 Units total) into the skin daily. 15 mL 11  . Magnesium Oxide 400 (240 Mg) MG TABS Take 1 tablet (400 mg total) by mouth daily. 30 tablet   . metFORMIN (GLUCOPHAGE) 500 MG tablet Take 500 mg by mouth 2 (two) times daily with a meal.    . metoprolol succinate (TOPROL-XL) 25 MG 24 hr tablet Take 25 mg by mouth daily.    . nitroGLYCERIN (NITROSTAT) 0.4 MG SL tablet PLACE 1 TABLET UNDER THE TONGUE EVERY 5 MINUTES AS NEEDED FOR CHEST PAIN 25 tablet 6  . RaNITidine HCl (ZANTAC PO) Take 1 tablet by mouth daily.    . RESTASIS 0.05 % ophthalmic emulsion Place 1 drop into both eyes 2 (two) times daily.    . rosuvastatin (CRESTOR) 10 MG tablet Take 10 mg by mouth daily.    Marland Kitchen senna-docusate (SENOKOT-S) 8.6-50 MG tablet Take 1 tablet by mouth 2 (two) times daily.     No current facility-administered medications for this visit.     Allergies:   Sulfur    Social History:  The patient  reports that she quit smoking about 40 years ago. Her smoking use included cigarettes. She has a 2.50 pack-year smoking history. She has never used smokeless tobacco. She reports that she does not drink alcohol or use drugs.   Family History:  The patient's family history includes CAD in her mother; Cancer in her mother; Heart attack in her brother, father, and sister; Heart disease in her mother; Stroke in her mother.    ROS: Noted in current history, otherwise review of systems is negative.  Physical Exam: Blood pressure 106/70, pulse (!) 101, height 5\' 4"  (1.626 m), weight 172 lb 6.4 oz (78.2 kg), SpO2 95 %.  GEN:  Well nourished, well developed in no acute distress HEENT: Normal NECK: No JVD; No carotid bruits LYMPHATICS: No lymphadenopathy CARDIAC:Irreg. Irreg.  RESPIRATORY:  Clear to auscultation without rales, wheezing or rhonchi  ABDOMEN: Soft, non-tender, non-distended MUSCULOSKELETAL:  No edema; No deformity  SKIN: Warm and dry NEUROLOGIC:   Alert and oriented x 3   EKG:    December 26, 2018: Atrial fibrillation with a heart rate of 101.  Possible anterior wall myocardial infarction.  Recent Labs: 09/01/2018: TSH 0.693 09/03/2018: Magnesium 2.1 09/08/2018: ALT 87; BUN 16; Creatinine, Ser 0.82; Hemoglobin 13.6; Platelets 187; Potassium 3.8; Sodium 140  Lipid Panel    Component Value Date/Time   CHOL 115 02/26/2018 1631   TRIG 112 02/26/2018 1631   HDL 52 02/26/2018 1631   CHOLHDL 2.2 02/26/2018 1631   LDLCALC 41 02/26/2018 1631      Wt Readings from Last 3 Encounters:  12/26/18 172 lb 6.4 oz (78.2 kg)  09/07/18 178 lb 5.6 oz (80.9 kg)  09/05/18 178 lb 5.6 oz (80.9 kg)    Other studies Reviewed: Additional studies/ records that were reviewed today include: . Review of the above records demonstrates:   ASSESSMENT AND PLAN:  1. Coronary artery disease: Stable.  She is not having any episodes of angina.  2. Atrial Fibrillation :   CHADS2VASC is  30  ( female, age 30, DM,  HTN)  She remains in atrial fibrillation.  She is on anticoagulation.  Heart rate is overall fairly well controlled.  We are limited by her hypotension as to how much beta-blocker we can give her.  We will continue to follow.  3. Chronic systolic congestive heart failure heart failure symptoms seem to be well controlled.  4. Essential hypertension -.  Blood pressure is actually on the low side.  I think that she would do better with a reduced dose of Lasix.  We will reduce her Lasix to 40 mg twice a week.  5. Diabetes mellitus -   6. Leg pain :   Current medicines are reviewed at length with the patient today.  The patient does not have concerns regarding medicines.     Disposition:   FU with  Me in  6 month    Mertie Moores, MD  12/26/2018 4:09 PM    [facility] [place], [place], [place]  [address] Phone: [phone]; Fax: [phone]

## 2018-12-26 ENCOUNTER — Other Ambulatory Visit: Payer: Self-pay

## 2018-12-26 ENCOUNTER — Ambulatory Visit: Payer: Medicare HMO | Admitting: Cardiovascular Disease

## 2018-12-26 ENCOUNTER — Encounter: Payer: Self-pay | Admitting: Cardiovascular Disease

## 2018-12-26 VITALS — BP 106/70 | HR 101 | Ht 64.0 in | Wt 172.4 lb

## 2018-12-26 DIAGNOSIS — I5022 Chronic systolic (congestive) heart failure: Secondary | ICD-10-CM | POA: Diagnosis not present

## 2018-12-26 DIAGNOSIS — I2510 Atherosclerotic heart disease of native coronary artery without angina pectoris: Secondary | ICD-10-CM | POA: Diagnosis not present

## 2018-12-26 DIAGNOSIS — I2583 Coronary atherosclerosis due to lipid rich plaque: Secondary | ICD-10-CM

## 2018-12-26 DIAGNOSIS — I482 Chronic atrial fibrillation, unspecified: Secondary | ICD-10-CM | POA: Diagnosis not present

## 2018-12-26 MED ORDER — FUROSEMIDE 40 MG PO TABS: 40.0000 mg | ORAL_TABLET | ORAL | 3 refills | Status: DC

## 2018-12-26 NOTE — Patient Instructions (Addendum)
Medication Instructions:  Your physician has recommended you make the following change in your medication: DECREASE Lasix (Furosemide) to 40 mg 2 days per week  *If you need a refill on your cardiac medications before your next appointment, please call your pharmacy*   Lab Work: None Ordered   Testing/Procedures: None Ordered   Follow-Up: At [facility], you and your health needs are our priority.  As part of our continuing mission to provide you with exceptional heart care, we have created designated Provider Care Teams.  These Care Teams include your primary Cardiologist (physician) and Advanced Practice Providers (APPs -  Physician Assistants and Nurse Practitioners) who all work together to provide you with the care you need, when you need it.  Your next appointment:   6 months  The format for your next appointment:   In Person  Provider:   You may see Mertie Moores, MD or one of the following Advanced Practice Providers on your designated Care Team:    Richardson Dopp, PA-C  [place], [place]  Daune Perch, [place]

## 2018-12-31 DIAGNOSIS — I5022 Chronic systolic (congestive) heart failure: Secondary | ICD-10-CM | POA: Diagnosis not present

## 2018-12-31 DIAGNOSIS — E1122 Type 2 diabetes mellitus with diabetic chronic kidney disease: Secondary | ICD-10-CM | POA: Diagnosis not present

## 2018-12-31 DIAGNOSIS — N1832 Chronic kidney disease, stage 3b: Secondary | ICD-10-CM | POA: Diagnosis not present

## 2018-12-31 DIAGNOSIS — I129 Hypertensive chronic kidney disease with stage 1 through stage 4 chronic kidney disease, or unspecified chronic kidney disease: Secondary | ICD-10-CM | POA: Diagnosis not present

## 2018-12-31 DIAGNOSIS — I4891 Unspecified atrial fibrillation: Secondary | ICD-10-CM | POA: Diagnosis not present

## 2018-12-31 DIAGNOSIS — R251 Tremor, unspecified: Secondary | ICD-10-CM | POA: Diagnosis not present

## 2019-01-06 DIAGNOSIS — I482 Chronic atrial fibrillation, unspecified: Secondary | ICD-10-CM | POA: Diagnosis not present

## 2019-01-12 DIAGNOSIS — N1832 Chronic kidney disease, stage 3b: Secondary | ICD-10-CM | POA: Diagnosis not present

## 2019-01-26 ENCOUNTER — Telehealth: Payer: Self-pay

## 2019-01-26 NOTE — Telephone Encounter (Signed)
The pt left her completed [employer] pt asst application at the office. I have completed the provider part of the application and will obtain the MDs signature while Im in the office on Wednesday 12/9 and will fax all to [employer] at that time.

## 2019-01-28 NOTE — Telephone Encounter (Signed)
Dr Acie Fredrickson has signed the pts application and I have faxed it to [employer] pt asst program.

## 2019-02-04 ENCOUNTER — Other Ambulatory Visit: Payer: Self-pay

## 2019-02-04 MED ORDER — APIXABAN 5 MG PO TABS: 5.0000 mg | ORAL_TABLET | Freq: Two times a day (BID) | ORAL | 1 refills | Status: DC

## 2019-02-04 NOTE — Telephone Encounter (Signed)
Pt last saw Dr Acie Fredrickson 12/26/18, last labs 09/08/18 Creat 0.82, age 78, weight 78.2kg, based on specified criteria pt is on appropriate dosage of Eliquis 5mg  BID.  Will refill rx.

## 2019-02-05 DIAGNOSIS — I482 Chronic atrial fibrillation, unspecified: Secondary | ICD-10-CM | POA: Diagnosis not present

## 2019-02-24 DIAGNOSIS — H5789 Other specified disorders of eye and adnexa: Secondary | ICD-10-CM | POA: Diagnosis not present

## 2019-02-24 DIAGNOSIS — R221 Localized swelling, mass and lump, neck: Secondary | ICD-10-CM | POA: Diagnosis not present

## 2019-02-24 DIAGNOSIS — L299 Pruritus, unspecified: Secondary | ICD-10-CM | POA: Diagnosis not present

## 2019-02-24 DIAGNOSIS — R21 Rash and other nonspecific skin eruption: Secondary | ICD-10-CM | POA: Diagnosis not present

## 2019-03-08 DIAGNOSIS — I482 Chronic atrial fibrillation, unspecified: Secondary | ICD-10-CM | POA: Diagnosis not present

## 2019-03-13 NOTE — Telephone Encounter (Signed)
Letter received from [employer] stating that they denied pt asst for the pt with her Eliquis. Reason: Eliquis is covered by the pts Ins plan. Application Case#: [id number]  The letter states that they have notified the pt of this denial as well.

## 2019-03-18 DIAGNOSIS — N1832 Chronic kidney disease, stage 3b: Secondary | ICD-10-CM | POA: Diagnosis not present

## 2019-03-25 DIAGNOSIS — R251 Tremor, unspecified: Secondary | ICD-10-CM | POA: Diagnosis not present

## 2019-03-25 DIAGNOSIS — I129 Hypertensive chronic kidney disease with stage 1 through stage 4 chronic kidney disease, or unspecified chronic kidney disease: Secondary | ICD-10-CM | POA: Diagnosis not present

## 2019-03-25 DIAGNOSIS — E1122 Type 2 diabetes mellitus with diabetic chronic kidney disease: Secondary | ICD-10-CM | POA: Diagnosis not present

## 2019-03-25 DIAGNOSIS — I4891 Unspecified atrial fibrillation: Secondary | ICD-10-CM | POA: Diagnosis not present

## 2019-03-25 DIAGNOSIS — N1832 Chronic kidney disease, stage 3b: Secondary | ICD-10-CM | POA: Diagnosis not present

## 2019-03-25 DIAGNOSIS — I5022 Chronic systolic (congestive) heart failure: Secondary | ICD-10-CM | POA: Diagnosis not present

## 2019-04-08 DIAGNOSIS — I482 Chronic atrial fibrillation, unspecified: Secondary | ICD-10-CM | POA: Diagnosis not present

## 2019-05-06 DIAGNOSIS — I482 Chronic atrial fibrillation, unspecified: Secondary | ICD-10-CM | POA: Diagnosis not present

## 2019-05-11 ENCOUNTER — Telehealth: Payer: Self-pay | Admitting: Cardiovascular Disease

## 2019-05-11 NOTE — Telephone Encounter (Signed)
   [facility] Pre-operative Risk Assessment    Request for surgical clearance:  1. What type of surgery is being performed? Tooth extraction x5  2. When is this surgery scheduled? 04.01.21  3. What type of clearance is required (medical clearance vs. Pharmacy clearance to hold med vs. Both)? Pharmacy  4. Are there any medications that need to be held prior to surgery and how long? Eliquis, TBD by Dr. Acie Fredrickson  5. Practice name and name of physician performing surgery? Pt only knows the practice:  [facility] in [place]  6. What is your office phone number: [phone]   7.   What is your office fax number: pt does not know  8.   Anesthesia type (None, local, MAC, general) ? Pt does not know   Pt called and was trying to determine how long she would need to be off Eliquis before she can have teeth extracted.    [name] 05/11/2019, 3:28 PM  _________________________________________________________________   (provider comments below)

## 2019-05-11 NOTE — Telephone Encounter (Signed)
Left message again we need to verify type of anesthesia as well we really need to verify information in regards to procedure as this information was obtained from the pt it seems.

## 2019-05-11 NOTE — Telephone Encounter (Signed)
Can you please call dentist office and get their fax number? Can you also confirm with them that patient is going to have 5 teeth extracted and see what type of anesthesia will be used?  Thank you!

## 2019-05-11 NOTE — Telephone Encounter (Signed)
[facility] in [place] is returning Carol's call. She states the fax number to their office is [phone].

## 2019-05-11 NOTE — Telephone Encounter (Signed)
I tried x 2 to reach dental office to confirm fax #, recording just kept going around and around with no-one answering the phone. The fax # is not on the website.

## 2019-05-11 NOTE — Telephone Encounter (Signed)
I left message for [facility] we received fax #, thank you. I will send message to pre op.

## 2019-05-12 NOTE — Telephone Encounter (Signed)
Patient has not been seen by dentist yet and has a New Patient Consult on 05/21/2019. Will remove from pre-op pool for now and wait for official clearance form from dentist office following upcoming visit.

## 2019-05-12 NOTE — Telephone Encounter (Signed)
I s/w Andria Frames at [facility] in [place] who tells me that they have not even seen the pt yet. Pt is a New Pt with their office and has a Consult on 05/21/19 with Dr. Ronnald Ramp. Dr. Ronnald Ramp will assess the pt at the appt as to what dental work will need to be done which clearance request will be faxed over after Consult. I thanked Andria Frames for her help in this matter. I assured Andria Frames that I will our pre op team know pt has New Pt Consult with their office 05/21/19.

## 2019-05-14 ENCOUNTER — Telehealth: Payer: Self-pay | Admitting: Cardiovascular Disease

## 2019-05-14 NOTE — Telephone Encounter (Signed)
New message:      Patient calling stating she is returning a call from a few days ago. Please call patient back. Patient

## 2019-05-14 NOTE — Telephone Encounter (Signed)
New message:     Patient would like for you to call her. Patient states she didn't say what dentist she was going to.

## 2019-05-14 NOTE — Telephone Encounter (Signed)
I called and s/w the pt and explained that when we talked to her on 05/11/19 she gave our office the name of the dental office and then we reached to the dental office for further information. Pt said thank you she forgot.

## 2019-05-14 NOTE — Telephone Encounter (Signed)
I s/w the pt and explained to her we were just needing more information about the dental work she said she is having. I explained to her that I got everything we needed from the dental office. I explained to her that the dental office will let us know after her consult on 05/21/19 what procedure will be done, how many teeth to be extracted etc. Pt thanked me for the call back.

## 2019-05-22 ENCOUNTER — Telehealth: Payer: Self-pay | Admitting: Cardiovascular Disease

## 2019-05-22 NOTE — Telephone Encounter (Signed)
   Primary Cardiologist: Mertie Moores, MD  Chart reviewed as part of pre-operative protocol coverage. Patient was contacted 05/22/2019 in reference to pre-operative risk assessment for pending surgery as outlined below.  Teresa Peck was last seen on 12/26/19 by Dr. Acie Fredrickson.  Since that day, Teresa Peck has done well with her CAD and her atrial fib.  She has not changed her activity level.   Therefore, based on ACC/AHA guidelines, the patient would be at acceptable risk for the planned procedure without further cardiovascular testing.   She does not need INR prior to procedure. She may hold Eliquis for 1 day prior to procedure and resume post op as soon as safe.    Her primary is to draw blood on the 14th of April, he follows her Hgb A1c.  Dr. Shelia Media.   I will route this recommendation to the requesting party via Epic fax function and remove from pre-op pool.  Please call with questions.  Cecilie Kicks, NP 05/22/2019, 11:46 AM

## 2019-05-22 NOTE — Telephone Encounter (Signed)
   [facility] Pre-operative Risk Assessment    Request for surgical clearance: PLEASE SEE NOTES FROM 05/11/19; ALSO DDS IS ASKING FOR A1C, ALSO DDS ASKING IF IT IS RECOMMEND AN INR NEEDS TO BE DONE 1-2 DAYS PRIOR TO EXTRACTIONS IF ON BLOOD THINNERS  1. What type of surgery is being performed? FULL MOUTH EXTRACTIONS   2. When is this surgery scheduled? TBD   3. What type of clearance is required (medical clearance vs. Pharmacy clearance to hold med vs. Both)? BOTH  4. Are there any medications that need to be held prior to surgery and how long? ELIQUIS   5. Practice name and name of physician performing surgery? [facility]; DR. Desma Maxim, DDS   6. What is your office phone number [phone]    7.   What is your office fax number [phone]  8.   Anesthesia type (None, local, MAC, general) ? GENERAL   Teresa Peck 05/22/2019, 9:28 AM  _________________________________________________________________   (provider comments below)

## 2019-05-22 NOTE — Telephone Encounter (Signed)
Pharm please address thanks 

## 2019-05-22 NOTE — Telephone Encounter (Signed)
I s/w the pt and stated I felt that we were calling her to let her know she has been cleared for her dental work and that she may need to hold the Eliquis x 1 day and resume right after the dental procedure as soon as the DDS feels it is safe to resume. I assured the pt that we did fax the notes to the dental office. Pt thanked me for the for the call and the help. I wished the pt a Happy Easter !!!

## 2019-05-22 NOTE — Telephone Encounter (Signed)
New message     Returning a call to nurse Sharyn Lull.

## 2019-05-22 NOTE — Telephone Encounter (Signed)
Patient with diagnosis of afib on Eliquis for anticoagulation.    Procedure:  FULL MOUTH EXTRACTIONS  Date of procedure: TBD  CHADS2-VASc score of  7 (CHF, HTN, AGE, DM2, CAD, AGE, female)  CrCl 55 ml/min  Per office protocol, patient can hold Eliquis for 1 day prior to procedure.    Do NOT recommend INR prior to procedure.

## 2019-05-30 ENCOUNTER — Ambulatory Visit: Payer: Medicare HMO | Attending: Internal Medicine

## 2019-05-30 DIAGNOSIS — Z23 Encounter for immunization: Secondary | ICD-10-CM

## 2019-05-30 NOTE — Progress Notes (Signed)
   Covid-19 Vaccination Clinic  Name:  MALYIA BURKER    MRN: [id number] DOB: 09/05/1940  05/30/2019  Ms. Delsignore was observed post Covid-19 immunization for 15 minutes without incident. She was provided with Vaccine Information Sheet and instruction to access the V-Safe system.   Ms. Rubinson was instructed to call 911 with any severe reactions post vaccine: Marland Kitchen Difficulty breathing  . Swelling of face and throat  . A fast heartbeat  . A bad rash all over body  . Dizziness and weakness   Immunizations Administered    Name Date Dose VIS Date Route   Pfizer COVID-19 Vaccine 05/30/2019 12:44 PM 0.3 mL 01/30/2019 Intramuscular   Manufacturer: [place]   Lot: [id number]   [place]: [id number]

## 2019-06-03 DIAGNOSIS — E1165 Type 2 diabetes mellitus with hyperglycemia: Secondary | ICD-10-CM | POA: Diagnosis not present

## 2019-06-03 DIAGNOSIS — I10 Essential (primary) hypertension: Secondary | ICD-10-CM | POA: Diagnosis not present

## 2019-06-06 DIAGNOSIS — I482 Chronic atrial fibrillation, unspecified: Secondary | ICD-10-CM | POA: Diagnosis not present

## 2019-06-09 ENCOUNTER — Telehealth: Payer: Self-pay | Admitting: Cardiovascular Disease

## 2019-06-09 NOTE — Telephone Encounter (Signed)
  Pt c/o medication issue:  1. Name of Medication: apixaban (ELIQUIS) 5 MG TABS tablet  2. How are you currently taking this medication (dosage and times per day)? Take 1 tablet (5 mg total) by mouth 2 (two) times daily.  3. Are you having a reaction (difficulty breathing--STAT)?  N/A  4. What is your medication issue? The patient states that she was told that she would need $180.00 to be able to get her Eliquis. She states she cannot afford that and would like to see if there are any assistance options available for her.

## 2019-06-09 NOTE — Telephone Encounter (Signed)
**Note De-Identified Del Wiseman Obfuscation** The pt states that BMS told her she has to pay 3% out of pocket which is $180 and she cannot afford this. We discussed her options and she wants to stay on Eliquis and will pay $125/90 day supply which is normal cost for name brand Eliquis with Medicare coverage.

## 2019-06-16 ENCOUNTER — Telehealth: Payer: Self-pay | Admitting: Cardiovascular Disease

## 2019-06-16 NOTE — Telephone Encounter (Signed)
I tried to reach out to [facility] to confirm if clearance was ever received April 03/2019. No answer. I will re-fax to [facility] Dr. Desma Maxim, DDS fax # [phone].

## 2019-06-16 NOTE — Telephone Encounter (Signed)
Follow Up:   Pt wants to know if the form she dropped off this morning, have been sent back to her dentist. 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

## 2019-06-17 ENCOUNTER — Telehealth: Payer: Self-pay | Admitting: *Deleted

## 2019-06-17 NOTE — Telephone Encounter (Signed)
LATE ENTRY: I s/w [facility] earlier this morning in regards to clearance for pt. After reviewing notes Looks like the clearance went to [facility]. [facility] stated the pt has been going back and forth between the two dental companies to see who had the cheapest prices. I said the pt did walk in a clearance yesterday. When speaking with [facility] this morning they said they would reach out to the pt and ask her which dental practice she is going to go with.

## 2019-06-17 NOTE — Telephone Encounter (Signed)
I have not entered a new clearance as there is a clearance already approved. I guess if pt chooses to go with [facility] we can then place a clearance in for [facility]. On the clearance form that was walked in it does state the pt will have 18 teeth extracted. I will have the clearance form that was walked in yesterday by the pt scanned into her chart if needed in the future.

## 2019-06-22 DIAGNOSIS — I10 Essential (primary) hypertension: Secondary | ICD-10-CM | POA: Diagnosis not present

## 2019-06-22 DIAGNOSIS — E1165 Type 2 diabetes mellitus with hyperglycemia: Secondary | ICD-10-CM | POA: Diagnosis not present

## 2019-06-24 ENCOUNTER — Ambulatory Visit: Payer: Medicare HMO | Attending: Internal Medicine

## 2019-06-24 DIAGNOSIS — Z23 Encounter for immunization: Secondary | ICD-10-CM

## 2019-06-24 NOTE — Progress Notes (Signed)
   Covid-19 Vaccination Clinic  Name:  Teresa Peck    MRN: [id number] DOB: 10/26/1940  06/24/2019  [name] was observed post Covid-19 immunization for 15 minutes without incident. She was provided with Vaccine Information Sheet and instruction to access the V-Safe system.   [name] was instructed to call 911 with any severe reactions post vaccine: Marland Kitchen Difficulty breathing  . Swelling of face and throat  . A fast heartbeat  . A bad rash all over body  . Dizziness and weakness   Immunizations Administered    Name Date Dose VIS Date Route   Pfizer COVID-19 Vaccine 06/24/2019 11:50 AM 0.3 mL 04/15/2018 Intramuscular   Manufacturer: [place]   Lot: [id number]   [place]: [id number]

## 2019-07-14 ENCOUNTER — Other Ambulatory Visit: Payer: Self-pay | Admitting: Pharmacist

## 2019-07-14 ENCOUNTER — Other Ambulatory Visit: Payer: Self-pay | Admitting: *Deleted

## 2019-07-14 MED ORDER — APIXABAN 5 MG PO TABS: 5.0000 mg | ORAL_TABLET | Freq: Two times a day (BID) | ORAL | 1 refills | Status: DC

## 2019-07-14 NOTE — Progress Notes (Signed)
Age 79, weight 78kg, SCr 0.82 on 09/08/18, added note to recheck labs on June 2021 appt, afib indication

## 2019-07-14 NOTE — Telephone Encounter (Signed)
Prescription refill request for Eliquis received.  Last office visit: 12/26/2018, Nahser Scr:  0.82, 09/08/2018 Age:  79 y.o. Weight: 78.2 kg   Prescription refill sent.

## 2019-07-23 ENCOUNTER — Telehealth: Payer: Self-pay | Admitting: Cardiovascular Disease

## 2019-07-23 DIAGNOSIS — F320 Major depressive disorder, single episode, mild: Secondary | ICD-10-CM | POA: Diagnosis not present

## 2019-07-23 NOTE — Telephone Encounter (Signed)
Spoke with patient's daughter-in-law who called to ask if she can pick up Eliquis samples for patient. She states the medication has not arrived from [facility] mail order pharmacy and the patient is out of medication. I advised that I will leave 2 boxes of samples for patient at our check-in. Caren Griffins states she will pick up the medication tomorrow. I advised her to call back with any questions or concerns and she thanked me for the call.

## 2019-07-23 NOTE — Telephone Encounter (Signed)
New Message  Pt's daughter called and wanted to know if any samples of Eliquis was available due to her mom not having her medication yet. Please call to discuss

## 2019-08-10 ENCOUNTER — Encounter: Payer: Self-pay | Admitting: Cardiovascular Disease

## 2019-08-10 ENCOUNTER — Ambulatory Visit: Payer: Medicare HMO | Admitting: Cardiovascular Disease

## 2019-08-10 ENCOUNTER — Other Ambulatory Visit: Payer: Self-pay

## 2019-08-10 VITALS — BP 106/72 | HR 100 | Ht 64.0 in | Wt 171.8 lb

## 2019-08-10 DIAGNOSIS — I2510 Atherosclerotic heart disease of native coronary artery without angina pectoris: Secondary | ICD-10-CM

## 2019-08-10 DIAGNOSIS — I5022 Chronic systolic (congestive) heart failure: Secondary | ICD-10-CM

## 2019-08-10 DIAGNOSIS — I2583 Coronary atherosclerosis due to lipid rich plaque: Secondary | ICD-10-CM | POA: Diagnosis not present

## 2019-08-10 NOTE — Progress Notes (Signed)
Cardiology Office Note   Date:  08/10/2019   ID:  Teresa, Peck [birth year], MRN [id number]  PCP:  Deland Pretty, MD  Cardiologist:   Mertie Moores, MD   Chief Complaint  Patient presents with  . Congestive Heart Failure  . Atrial Fibrillation   Problem list: 1. Coronary artery disease: She has an occluded LAD. She status post stenting of her first diagonal 2. Hyperlipidemia 3. Chronic systolic congestive heart failure 4. Essential hypertension 5. 5. Diabetes mellitus - peripheral neuropathy    Teresa Peck is a 79 y.o. female who presents for follow-up of her coronary artery disease. She's been seen in the past by Dr. Rockey Situ. She was seen with her Daughter, Teresa Peck.  She had a cath in 2004 and had stenting of her 1st diag. Dr. Minna Antis has stopped some of her meds ( Coreg) also stopped allopurinol and Digoxin.   She denies any cp.  Breathing is ok.   Eats lots of Progresso soup.  She does not exercise.   Knows that she needs to walk more.  Has claudication with ambulation .   Aug. 30, 2016:   Doing well.  Complains of leg pain - was told that she had diabetic neuropathy Lots of leg swelling ,  Still eating lots of soup and canned foods.   Dec. 2 , 2016:  Breathing is good.  Leg Swelling is better  She thinks the Actos is causing fluid retention and weight gain .   May 17, 2015:  Has had her back surgery  ,.   Legs and back feel much better.  She was hypotensive prior to her back surgery and we held her coreg and Quinipril   Sept. 19, 2017:  Teresa Peck is seen back for a follow up visit. Has cut out her salt. Has lots 8 lbs.  Breathing is ok   Cannot tell that her HR is irregular.   Sept. 11, 2018:  Seen today for follow  Breathing is going well.   June 04, 2017:   Teresa Peck is seen today for follow-up of her coronary artery disease, chronic systolic congestive heart failure, atrial fibrillation.  She also has a history of  hyperlipidemia.  Has bilateral leg pain when she walks   February 26, 2018  Seen for follow  Up  Still has frequent episodes of leg pain, she admits that she does not walk enough. Her breathing seems to be okay.  She is not having any episodes of chest pain.  Nov. 6, 2020   Doing well. Still in Afib  Had a serious fall in July,  ? Syncope  Was in the hospital off and on through most of the month of July  Doing well , no syncope   August 10, 2019: Teresa Peck is seen today for follow-up of her congestive heart failure, atrial fibrillation, coronary artery disease. Seen with daughter, Teresa Peck .  Has developed a tremor.   Cannot find a solution to her tremor .  No CP or dyspnea   Past Medical History:  Diagnosis Date  . Anemia   . Arthritis   . CAD (coronary artery disease)   . CAD (coronary artery disease)   . Cancer ([place])    skin cancer  . CHF (congestive heart failure) ([place])   . Coronary artery disease    MI 2004-stent  . Diabetes mellitus   . Diabetic neuropathy ([place])   . GERD (gastroesophageal reflux disease)    tums  . Gout   .  Hiatal hernia   . Hyperlipidemia   . Hypertension   . PAD (peripheral artery disease) ([place])   . Tremor     Past Surgical History:  Procedure Laterality Date  . BILATERAL CORNEA IMPLANTS  2010  . CARDIAC CATHETERIZATION     stent post MI   . CHOLECYSTECTOMY    . CORONARY ANGIOPLASTY     2004  stent  . EYE SURGERY    . [place]   right  . LUMBAR LAMINECTOMY/DECOMPRESSION MICRODISCECTOMY N/A 04/13/2015   Procedure: L4-5 Decompression;  Surgeon: Marybelle Killings, MD;  Location: [facility];  Service: Orthopedics;  Laterality: N/A;  Need RNFA     Current Outpatient Medications  Medication Sig Dispense Refill  . acetaminophen (TYLENOL) 325 MG tablet Take 2 tablets (650 mg total) by mouth every 6 (six) hours as needed for mild pain or headache.    . allopurinol (ZYLOPRIM) 300 MG tablet Take 300 mg by mouth daily.    Marland Kitchen apixaban (ELIQUIS) 5 MG  TABS tablet Take 1 tablet (5 mg total) by mouth 2 (two) times daily. 180 tablet 1  . furosemide (LASIX) 40 MG tablet Take 1 tablet (40 mg total) by mouth 2 (two) times a week. 30 tablet 3  . gabapentin (NEURONTIN) 600 MG tablet Take 600 mg by mouth 2 (two) times daily.      . insulin glargine (LANTUS) 100 unit/mL SOPN Inject 0.1 mLs (10 Units total) into the skin daily. 15 mL 11  . Magnesium Oxide 400 (240 Mg) MG TABS Take 1 tablet (400 mg total) by mouth daily. 30 tablet   . metoprolol succinate (TOPROL-XL) 25 MG 24 hr tablet Take 25 mg by mouth daily.    . nitroGLYCERIN (NITROSTAT) 0.4 MG SL tablet PLACE 1 TABLET UNDER THE TONGUE EVERY 5 MINUTES AS NEEDED FOR CHEST PAIN 25 tablet 6  . rosuvastatin (CRESTOR) 10 MG tablet Take 10 mg by mouth daily.    Marland Kitchen senna-docusate (SENOKOT-S) 8.6-50 MG tablet Take 1 tablet by mouth 2 (two) times daily.    . TRUE METRIX BLOOD GLUCOSE TEST test strip 1 each by Other route as needed.     No current facility-administered medications for this visit.    Allergies:   Sulfur    Social History:  The patient  reports that she quit smoking about 41 years ago. Her smoking use included cigarettes. She has a 2.50 pack-year smoking history. She has never used smokeless tobacco. She reports that she does not drink alcohol and does not use drugs.   Family History:  The patient's family history includes CAD in her mother; Cancer in her mother; Heart attack in her brother, father, and sister; Heart disease in her mother; Stroke in her mother.    ROS: Noted in current history, otherwise review of systems is negative.  Physical Exam: Blood pressure 106/72, pulse 100, height 5\' 4"  (1.626 m), weight 171 lb 12 oz (77.9 kg), SpO2 96 %.  GEN:  Elderly female,  NAD  HEENT: Normal NECK: No JVD; No carotid bruits LYMPHATICS: No lymphadenopathy CARDIAC: Irreg. Irreg.  RESPIRATORY:  Clear to auscultation without rales, wheezing or rhonchi  ABDOMEN: Soft, non-tender,  non-distended MUSCULOSKELETAL:  No edema; No deformity  SKIN: Warm and dry NEUROLOGIC:  Alert and oriented x 3   EKG:       Recent Labs: 09/01/2018: TSH 0.693 09/03/2018: Magnesium 2.1 09/08/2018: ALT 87; BUN 16; Creatinine, Ser 0.82; Hemoglobin 13.6; Platelets 187; Potassium 3.8; Sodium 140  Lipid Panel    Component Value Date/Time   CHOL 115 02/26/2018 1631   TRIG 112 02/26/2018 1631   HDL 52 02/26/2018 1631   CHOLHDL 2.2 02/26/2018 1631   LDLCALC 41 02/26/2018 1631      Wt Readings from Last 3 Encounters:  08/10/19 171 lb 12 oz (77.9 kg)  12/26/18 172 lb 6.4 oz (78.2 kg)  09/07/18 178 lb 5.6 oz (80.9 kg)    Other studies Reviewed: Additional studies/ records that were reviewed today include: . Review of the above records demonstrates:   ASSESSMENT AND PLAN:  1. Coronary artery disease:  No recent angina   2. Atrial Fibrillation :   CHADS2VASC is  34  ( female, age 60, DM,  HTN)  Has persistant AF ,  Cont Eliquis.   Requesting CBC and BMP from her primary MD office     3. Chronic systolic congestive heart failure:    4. Essential hypertension -.  BP is well  Controlled.   5. Diabetes mellitus -   6. Leg pain :   Current medicines are reviewed at length with the patient today.  The patient does not have concerns regarding medicines.     Disposition:     Mertie Moores, MD  08/10/2019 4:10 PM    [facility] [address], [place], [place]  [address] Phone: [phone]; Fax: [phone]

## 2019-08-10 NOTE — Patient Instructions (Signed)
Medication Instructions:  Your physician recommends that you continue on your current medications as directed. Please refer to the Current Medication list given to you today.  *If you need a refill on your cardiac medications before your next appointment, please call your pharmacy*   Lab Work: Please have your lab results faxed to [phone]    Testing/Procedures: None Ordered   Follow-Up: At [facility], you and your health needs are our priority.  As part of our continuing mission to provide you with exceptional heart care, we have created designated Provider Care Teams.  These Care Teams include your primary Cardiologist (physician) and Advanced Practice Providers (APPs -  Physician Assistants and Nurse Practitioners) who all work together to provide you with the care you need, when you need it.  We recommend signing up for the patient portal called "MyChart".  Sign up information is provided on this After Visit Summary.  MyChart is used to connect with patients for Virtual Visits (Telemedicine).  Patients are able to view lab/test results, encounter notes, upcoming appointments, etc.  Non-urgent messages can be sent to your provider as well.   To learn more about what you can do with MyChart, go to [email or web address].    Your next appointment:   6 month(s)  The format for your next appointment:   In Person  Provider:   You may see Mertie Moores, MD or one of the following Advanced Practice Providers on your designated Care Team:    Richardson Dopp, PA-C  [place], [place]

## 2019-08-11 ENCOUNTER — Telehealth: Payer: Self-pay | Admitting: Cardiovascular Disease

## 2019-08-11 NOTE — Telephone Encounter (Signed)
Patient would like to ensure about whether or not she is eligible to begin taking Biotin 10,000 MG. Please advise.

## 2019-08-11 NOTE — Telephone Encounter (Signed)
Spoke with patient and advised her about the interference that biotin can have with lab results. States she has been taking 2500 mcg daily and wanted to increase to 10000 mcg. I advised her that if there is no real advantage of increasing that our advice would be continue 2500 mcg daily and to make certain all her providers know that she is taking it in the event that she has abnormal lab results. I have updated our med list. She thanked me for the call.

## 2019-08-11 NOTE — Telephone Encounter (Signed)
Have attempted to call pt multiple times and phone is busy. Biotin can affect lab results, specifically thyroid so I plan to ask her if biotin is necessary and if not it may be best not for her to start this supplement.

## 2019-08-11 NOTE — Telephone Encounter (Signed)
Follow up   Pt returning call, she said she got Michelle's message but still wanted to speak with her

## 2019-08-11 NOTE — Telephone Encounter (Signed)
Left detailed message for patient that the biotin can interfere with lab results and to call back to discuss the reason she has been advised to start it.

## 2019-09-01 DIAGNOSIS — E1165 Type 2 diabetes mellitus with hyperglycemia: Secondary | ICD-10-CM | POA: Diagnosis not present

## 2019-09-08 DIAGNOSIS — I4891 Unspecified atrial fibrillation: Secondary | ICD-10-CM | POA: Diagnosis not present

## 2019-09-08 DIAGNOSIS — I10 Essential (primary) hypertension: Secondary | ICD-10-CM | POA: Diagnosis not present

## 2019-09-08 DIAGNOSIS — Z794 Long term (current) use of insulin: Secondary | ICD-10-CM | POA: Diagnosis not present

## 2019-09-08 DIAGNOSIS — E1121 Type 2 diabetes mellitus with diabetic nephropathy: Secondary | ICD-10-CM | POA: Diagnosis not present

## 2019-09-08 DIAGNOSIS — E7800 Pure hypercholesterolemia, unspecified: Secondary | ICD-10-CM | POA: Diagnosis not present

## 2019-09-17 DIAGNOSIS — N1832 Chronic kidney disease, stage 3b: Secondary | ICD-10-CM | POA: Diagnosis not present

## 2019-09-22 DIAGNOSIS — R809 Proteinuria, unspecified: Secondary | ICD-10-CM | POA: Diagnosis not present

## 2019-09-22 DIAGNOSIS — N1832 Chronic kidney disease, stage 3b: Secondary | ICD-10-CM | POA: Diagnosis not present

## 2019-09-22 DIAGNOSIS — E1129 Type 2 diabetes mellitus with other diabetic kidney complication: Secondary | ICD-10-CM | POA: Diagnosis not present

## 2019-09-22 DIAGNOSIS — I5022 Chronic systolic (congestive) heart failure: Secondary | ICD-10-CM | POA: Diagnosis not present

## 2019-09-22 DIAGNOSIS — R251 Tremor, unspecified: Secondary | ICD-10-CM | POA: Diagnosis not present

## 2019-09-22 DIAGNOSIS — E1122 Type 2 diabetes mellitus with diabetic chronic kidney disease: Secondary | ICD-10-CM | POA: Diagnosis not present

## 2019-09-22 DIAGNOSIS — I129 Hypertensive chronic kidney disease with stage 1 through stage 4 chronic kidney disease, or unspecified chronic kidney disease: Secondary | ICD-10-CM | POA: Diagnosis not present

## 2019-09-22 DIAGNOSIS — I4891 Unspecified atrial fibrillation: Secondary | ICD-10-CM | POA: Diagnosis not present

## 2019-11-16 DIAGNOSIS — Z9189 Other specified personal risk factors, not elsewhere classified: Secondary | ICD-10-CM | POA: Diagnosis not present

## 2019-11-16 DIAGNOSIS — R5383 Other fatigue: Secondary | ICD-10-CM | POA: Diagnosis not present

## 2019-11-16 DIAGNOSIS — R630 Anorexia: Secondary | ICD-10-CM | POA: Diagnosis not present

## 2019-11-16 DIAGNOSIS — F329 Major depressive disorder, single episode, unspecified: Secondary | ICD-10-CM | POA: Diagnosis not present

## 2020-01-01 ENCOUNTER — Telehealth: Payer: Self-pay | Admitting: Cardiovascular Disease

## 2020-01-01 NOTE — Telephone Encounter (Signed)
    Pt c/o medication issue:  1. Name of Medication: melatonin   2. How are you currently taking this medication (dosage and times per day)?   3. Are you having a reaction (difficulty breathing--STAT)?   4. What is your medication issue? Pt said she is having hard time to sleep at night and she got a over the counter melatonin tablet. She would like to know if its safe for her to take it

## 2020-01-01 NOTE — Telephone Encounter (Signed)
Called patient back and informed her of PharmD's advisement that it would be okay for her to take Melatonin each evening. She verbalized understanding.

## 2020-01-01 NOTE — Telephone Encounter (Signed)
Yes, melatonin should be fine for her to take

## 2020-01-07 DIAGNOSIS — M791 Myalgia, unspecified site: Secondary | ICD-10-CM | POA: Diagnosis not present

## 2020-01-07 DIAGNOSIS — I2510 Atherosclerotic heart disease of native coronary artery without angina pectoris: Secondary | ICD-10-CM | POA: Diagnosis not present

## 2020-01-07 DIAGNOSIS — E1121 Type 2 diabetes mellitus with diabetic nephropathy: Secondary | ICD-10-CM | POA: Diagnosis not present

## 2020-01-09 DIAGNOSIS — M10072 Idiopathic gout, left ankle and foot: Secondary | ICD-10-CM | POA: Diagnosis not present

## 2020-01-09 DIAGNOSIS — M79672 Pain in left foot: Secondary | ICD-10-CM | POA: Diagnosis not present

## 2020-01-18 DIAGNOSIS — I10 Essential (primary) hypertension: Secondary | ICD-10-CM | POA: Diagnosis not present

## 2020-01-18 DIAGNOSIS — E1121 Type 2 diabetes mellitus with diabetic nephropathy: Secondary | ICD-10-CM | POA: Diagnosis not present

## 2020-01-18 DIAGNOSIS — R5382 Chronic fatigue, unspecified: Secondary | ICD-10-CM | POA: Diagnosis not present

## 2020-01-18 DIAGNOSIS — R5383 Other fatigue: Secondary | ICD-10-CM | POA: Diagnosis not present

## 2020-01-18 DIAGNOSIS — E1165 Type 2 diabetes mellitus with hyperglycemia: Secondary | ICD-10-CM | POA: Diagnosis not present

## 2020-01-18 DIAGNOSIS — M810 Age-related osteoporosis without current pathological fracture: Secondary | ICD-10-CM | POA: Diagnosis not present

## 2020-01-18 DIAGNOSIS — M791 Myalgia, unspecified site: Secondary | ICD-10-CM | POA: Diagnosis not present

## 2020-02-02 DIAGNOSIS — I2510 Atherosclerotic heart disease of native coronary artery without angina pectoris: Secondary | ICD-10-CM | POA: Diagnosis not present

## 2020-02-02 DIAGNOSIS — Z789 Other specified health status: Secondary | ICD-10-CM | POA: Diagnosis not present

## 2020-02-02 DIAGNOSIS — E1165 Type 2 diabetes mellitus with hyperglycemia: Secondary | ICD-10-CM | POA: Diagnosis not present

## 2020-02-02 DIAGNOSIS — I10 Essential (primary) hypertension: Secondary | ICD-10-CM | POA: Diagnosis not present

## 2020-02-03 ENCOUNTER — Telehealth: Payer: Self-pay | Admitting: Cardiovascular Disease

## 2020-02-03 MED ORDER — APIXABAN 2.5 MG PO TABS: 2.5000 mg | ORAL_TABLET | Freq: Two times a day (BID) | ORAL | 3 refills | Status: AC

## 2020-02-03 NOTE — Telephone Encounter (Signed)
Pt c/o medication issue:  1. Name of Medication: apixaban (ELIQUIS) 5 MG TABS tablet  2. How are you currently taking this medication (dosage and times per day)? 1 tablet by mouth two times daily   3. Are you having a reaction (difficulty breathing--STAT)? No   4. What is your medication issue? Annison states Dr. Merleen Nicely is wanting to change her Eliquis dosage from 5 MG's to 2.5 MG's. Please advise.

## 2020-02-03 NOTE — Telephone Encounter (Signed)
Okay to change if Scr =/> 1.5  Please get recent BMET result from PCP to scan copy for our records.  Med Record updated, and talked to patient as well. She will use Eliquis 5mg  until all gone, then request refill for 2.5mg  (per PCP instructions she will be cutting Eliquis 5mg  tablets in 1/2 for now).

## 2020-02-03 NOTE — Telephone Encounter (Addendum)
Spoke with the patient regarding Dr. Merleen Nicely (her diabetes doctor) lowering her dose of Eliquis from 5 mg to 2.5 mg daily. Patient unsure of the name of Dr. Rae Roam office. Patient states that she is unsure why he lowered her dose but thinks it was due to her kidney function. Patient states she would like to make sure this is okay with Dr. Acie Fredrickson before lowering her dose. Will send to [place] and Pharmd for advisement.

## 2020-02-03 NOTE — Telephone Encounter (Signed)
Available BMET form 08/2019 show appropriate Scr for Eliquis 5mg  BID.  We need to know Dr Shaune Spittle concern to decrease her dose.   Dx Atrial Fibrillation 79yo 77.9kg Scr = 1.4  Eliquis 5mg  twice daily remains appropriate.

## 2020-02-03 NOTE — Telephone Encounter (Signed)
Called [facility], patients PCP and requested the patients recent lab results be faxed to our office.

## 2020-02-03 NOTE — Addendum Note (Signed)
Addended by: Harrington Challenger on: 02/03/2020 04:01 PM   Modules accepted: Orders

## 2020-02-03 NOTE — Telephone Encounter (Signed)
Spoke with Dr. Merleen Nicely at [facility] regarding patients Eliquis dose change. He states that based on her age being over 79 years old and Creatinine rising from 1.5 to 1.6 recently he changed patients dose to 2.5 mg BID.   Will route to PharmD for reconsideration based on updated SCr value.

## 2020-02-26 ENCOUNTER — Ambulatory Visit: Payer: Medicare HMO | Admitting: Cardiovascular Disease

## 2020-03-18 ENCOUNTER — Other Ambulatory Visit: Payer: Self-pay

## 2020-03-18 NOTE — Patient Outreach (Signed)
[facility]) Care Management  03/18/2020  Teresa Peck 07-26-1940 [id number]   Telephone Screen  Referral Date: 03/18/2020 Referral Source: Nurse Call Center Referral Reason: "Nurse Advice regarding use of Vaseline petroleum jelly on scabbed area inside the nose" Insurance: [name]   Outreach attempt # 1 to patient. No answer. RN CM left HIPAA compliant voicemail message along with contact info.      Plan: RN CM will will make outreach attempt to patient within 3-4 business days. RN CM will send unsuccessful outreach letter to patient.   Enzo Montgomery, RN,BSN,CCM [facility] Telephonic Care Management Coordinator Direct Phone: [phone] Toll Free: [phone] Fax: [phone]

## 2020-03-21 ENCOUNTER — Other Ambulatory Visit: Payer: Self-pay

## 2020-03-21 NOTE — Patient Outreach (Signed)
[facility]) Care Management  03/21/2020  Teresa Peck 07/10/1940 [id number]    Telephone Screen  Referral Date: 03/18/2020 Referral Source: Nurse Call Center Referral Reason: "Nurse Advice regarding use of Vaseline petroleum jelly on scabbed area inside the nose" Insurance: [facility]    Outreach attempt # 2 to patient. Spoke with patient briefly as she was rushing to end call. RN CM discussed recent call to Nurse Line, She reports that the person she spoke with was very helpful and answered her question. She denies any other questions or concerns at this time and did not wish to complete screening assessment.       Plan: RN CM will close case at this time.   Enzo Montgomery, RN,BSN,CCM [facility] Telephonic Care Management Coordinator Direct Phone: [phone] Toll Free: [phone] Fax: [phone]

## 2020-04-06 DIAGNOSIS — H02831 Dermatochalasis of right upper eyelid: Secondary | ICD-10-CM | POA: Diagnosis not present

## 2020-04-06 DIAGNOSIS — Z0100 Encounter for examination of eyes and vision without abnormal findings: Secondary | ICD-10-CM | POA: Diagnosis not present

## 2020-04-06 DIAGNOSIS — E109 Type 1 diabetes mellitus without complications: Secondary | ICD-10-CM | POA: Diagnosis not present

## 2020-04-06 DIAGNOSIS — H50012 Monocular esotropia, left eye: Secondary | ICD-10-CM | POA: Diagnosis not present

## 2020-04-06 DIAGNOSIS — H524 Presbyopia: Secondary | ICD-10-CM | POA: Diagnosis not present

## 2020-04-06 DIAGNOSIS — H02834 Dermatochalasis of left upper eyelid: Secondary | ICD-10-CM | POA: Diagnosis not present

## 2020-04-08 ENCOUNTER — Other Ambulatory Visit: Payer: Self-pay

## 2020-04-08 ENCOUNTER — Ambulatory Visit: Payer: Medicare HMO | Admitting: Cardiovascular Disease

## 2020-04-08 ENCOUNTER — Encounter: Payer: Self-pay | Admitting: Cardiovascular Disease

## 2020-04-08 VITALS — BP 98/72 | HR 71 | Ht 64.0 in | Wt 176.0 lb

## 2020-04-08 DIAGNOSIS — I5022 Chronic systolic (congestive) heart failure: Secondary | ICD-10-CM

## 2020-04-08 NOTE — Patient Instructions (Signed)
Medication Instructions:  Your physician recommends that you continue on your current medications as directed. Please refer to the Current Medication list given to you today.  *If you need a refill on your cardiac medications before your next appointment, please call your pharmacy*   Lab Work: none If you have labs (blood work) drawn today and your tests are completely normal, you will receive your results only by: Marland Kitchen MyChart Message (if you have MyChart) OR . A paper copy in the mail If you have any lab test that is abnormal or we need to change your treatment, we will call you to review the results.   Testing/Procedures: none   Follow-Up: At [facility], you and your health needs are our priority.  As part of our continuing mission to provide you with exceptional heart care, we have created designated Provider Care Teams.  These Care Teams include your primary Cardiologist (physician) and Advanced Practice Providers (APPs -  Physician Assistants and Nurse Practitioners) who all work together to provide you with the care you need, when you need it.  We recommend signing up for the patient portal called "MyChart".  Sign up information is provided on this After Visit Summary.  MyChart is used to connect with patients for Virtual Visits (Telemedicine).  Patients are able to view lab/test results, encounter notes, upcoming appointments, etc.  Non-urgent messages can be sent to your provider as well.   To learn more about what you can do with MyChart, go to [email or web address].    Your next appointment:   6 month(s)  The format for your next appointment:   In Person  Provider:   You may see one of the following Advanced Practice Providers on your designated Care Team:    Richardson Dopp, PA-C  Vin Qu[place], [place]

## 2020-04-08 NOTE — Progress Notes (Signed)
Cardiology Office Note   Date:  04/08/2020   ID:  Teresa Peck, DOB Sep 14, 1940, MRN [id number]  PCP:  Deland Pretty, MD  Cardiologist:   Mertie Moores, MD   No chief complaint on file.  Problem list: 1. Coronary artery disease: She has an occluded LAD. She status post stenting of her first diagonal 2. Hyperlipidemia 3. Chronic systolic congestive heart failure 4. Essential hypertension 5. 5. Diabetes mellitus - peripheral neuropathy    Teresa Peck is a 80 y.o. female who presents for follow-up of her coronary artery disease. She's been seen in the past by Dr. Rockey Situ. She was seen with her Daughter, [name].  She had a cath in 2004 and had stenting of her 1st diag. Dr. Minna Antis has stopped some of her meds ( Coreg) also stopped allopurinol and Digoxin.   She denies any cp.  Breathing is ok.   Eats lots of Progresso soup.  She does not exercise.   Knows that she needs to walk more.  Has claudication with ambulation .   Aug. 30, 2016:   Doing well.  Complains of leg pain - was told that she had diabetic neuropathy Lots of leg swelling ,  Still eating lots of soup and canned foods.   Dec. 2 , 2016:  Breathing is good.  Leg Swelling is better  She thinks the Actos is causing fluid retention and weight gain .   May 17, 2015:  Has had her back surgery  ,.   Legs and back feel much better.  She was hypotensive prior to her back surgery and we held her coreg and Quinipril   Sept. 19, 2017:  Teresa Peck is seen back for a follow up visit. Has cut out her salt. Has lots 8 lbs.  Breathing is ok   Cannot tell that her HR is irregular.   Sept. 11, 2018:  Seen today for follow  Breathing is going well.   June 04, 2017:   Teresa Peck is seen today for follow-up of her coronary artery disease, chronic systolic congestive heart failure, atrial fibrillation.  She also has a history of hyperlipidemia.  Has bilateral leg pain when she walks   February 26, 2018  Seen for follow  Up  Still has frequent episodes of leg pain, she admits that she does not walk enough. Her breathing seems to be okay.  She is not having any episodes of chest pain.  Nov. 6, 2020   Doing well. Still in Afib  Had a serious fall in July,  ? Syncope  Was in the hospital off and on through most of the month of July  Doing well , no syncope   August 10, 2019: Teresa Peck is seen today for follow-up of her congestive heart failure, atrial fibrillation, coronary artery disease. Seen with daughter, Jenny Reichmann .  Has developed a tremor.   Cannot find a solution to her tremor .  No CP or dyspnea   Feb. 18, 2022: Teresa Peck is seen today for follow up of her CHF, Afib, CAD . No cp, no dyspnea.    Complains of bilateral calf pain .  She was concerned about PAD. Screening ABIs in 2019 were normal .  Bp is borderline low today  Drinks water but does not eat much protein      Past Medical History:  Diagnosis Date  . Anemia   . Arthritis   . CAD (coronary artery disease)   . CAD (coronary artery disease)   .  Cancer ([place])    skin cancer  . CHF (congestive heart failure) ([place])   . Coronary artery disease    MI 2004-stent  . Diabetes mellitus   . Diabetic neuropathy ([place])   . GERD (gastroesophageal reflux disease)    tums  . Gout   . Hiatal hernia   . Hyperlipidemia   . Hypertension   . PAD (peripheral artery disease) ([place])   . Tremor     Past Surgical History:  Procedure Laterality Date  . BILATERAL CORNEA IMPLANTS  2010  . CARDIAC CATHETERIZATION     stent post MI   . CHOLECYSTECTOMY    . CORONARY ANGIOPLASTY     2004  stent  . EYE SURGERY    . [place]   right  . LUMBAR LAMINECTOMY/DECOMPRESSION MICRODISCECTOMY N/A 04/13/2015   Procedure: L4-5 Decompression;  Surgeon: Marybelle Killings, MD;  Location: [place];  Service: Orthopedics;  Laterality: N/A;  Need RNFA     Current Outpatient Medications  Medication Sig Dispense Refill  . acetaminophen  (TYLENOL) 325 MG tablet Take 2 tablets (650 mg total) by mouth every 6 (six) hours as needed for mild pain or headache.    . allopurinol (ZYLOPRIM) 300 MG tablet Take 300 mg by mouth daily.    Marland Kitchen apixaban (ELIQUIS) 2.5 MG TABS tablet Take 1 tablet (2.5 mg total) by mouth 2 (two) times daily. 180 tablet 3  . Biotin 2500 MCG CAPS Take 2,500 mcg by mouth.    . Calcium Carb-Cholecalciferol (CALCIUM + D3) 600-800 MG-UNIT TABS Take 1 tablet by mouth daily.    . Cyanocobalamin (VITAMIN B12) 1000 MCG TBCR Take 1 tablet by mouth daily.    Marland Kitchen ezetimibe (ZETIA) 10 MG tablet Take 1 tablet by mouth daily.    . furosemide (LASIX) 40 MG tablet Take 20 mg by mouth 2 (two) times a week.    . gabapentin (NEURONTIN) 600 MG tablet Take 600 mg by mouth 2 (two) times daily.      . insulin glargine (LANTUS) 100 unit/mL SOPN Inject 0.1 mLs (10 Units total) into the skin daily. 15 mL 11  . Magnesium Oxide 400 (240 Mg) MG TABS Take 1 tablet (400 mg total) by mouth daily. 30 tablet   . metoprolol succinate (TOPROL-XL) 25 MG 24 hr tablet Take 25 mg by mouth daily.    . Multiple Vitamins-Minerals (CENTRUM SILVER 50+WOMEN) TABS Take 1 tablet by mouth daily.    . nitroGLYCERIN (NITROSTAT) 0.4 MG SL tablet PLACE 1 TABLET UNDER THE TONGUE EVERY 5 MINUTES AS NEEDED FOR CHEST PAIN 25 tablet 6  . rosuvastatin (CRESTOR) 10 MG tablet Take 10 mg by mouth daily.    Marland Kitchen senna-docusate (SENOKOT-S) 8.6-50 MG tablet Take 1 tablet by mouth 2 (two) times daily.    . TRUE METRIX BLOOD GLUCOSE TEST test strip 1 each by Other route as needed.     No current facility-administered medications for this visit.    Allergies:   Elemental sulfur    Social History:  The patient  reports that she quit smoking about 42 years ago. Her smoking use included cigarettes. She has a 2.50 pack-year smoking history. She has never used smokeless tobacco. She reports that she does not drink alcohol and does not use drugs.   Family History:  The patient's family  history includes CAD in her mother; Cancer in her mother; Heart attack in her brother, father, and sister; Heart disease in her mother; Stroke in her mother.  ROS: Noted in current history, otherwise review of systems is negative.   Physical Exam: Blood pressure 98/72, pulse 71, height 5\' 4"  (1.626 m), weight 176 lb (79.8 kg), SpO2 97 %.  GEN:   Elderly , frail female,  HEENT: Normal NECK: No JVD; No carotid bruits LYMPHATICS: No lymphadenopathy CARDIAC: irreg. Irreg.  RESPIRATORY:  Clear to auscultation without rales, wheezing or rhonchi  ABDOMEN: Soft, non-tender, non-distended MUSCULOSKELETAL:  No edema; No deformity  SKIN: Warm and dry NEUROLOGIC:  Alert and oriented x 3   EKG:       Feb. 18, 2022:  Atrial fib with controlled V response   Recent Labs: No results found for requested labs within last 8760 hours.    Lipid Panel    Component Value Date/Time   CHOL 115 02/26/2018 1631   TRIG 112 02/26/2018 1631   HDL 52 02/26/2018 1631   CHOLHDL 2.2 02/26/2018 1631   LDLCALC 41 02/26/2018 1631      Wt Readings from Last 3 Encounters:  04/08/20 176 lb (79.8 kg)  08/10/19 171 lb 12 oz (77.9 kg)  12/26/18 172 lb 6.4 oz (78.2 kg)    Other studies Reviewed: Additional studies/ records that were reviewed today include: . Review of the above records demonstrates:   ASSESSMENT AND PLAN:  1. Coronary artery disease:    2. Atrial Fibrillation :   CHADS2VASC is  51  ( female, age 80, DM,  HTN)  Has persistant AF ,       3. Chronic systolic congestive heart failure:    4. Essential hypertension -.  BP is well  Controlled.   5. Diabetes mellitus -   6. Leg pain :   ABI screening was normal in 2019.     Current medicines are reviewed at length with the patient today.  The patient does not have concerns regarding medicines.    Disposition:     Mertie Moores, MD  04/08/2020 4:27 PM    [address], [place], [place]   [id number] Phone: [phone]; Fax: [phone]

## 2020-05-03 DIAGNOSIS — E7800 Pure hypercholesterolemia, unspecified: Secondary | ICD-10-CM | POA: Diagnosis not present

## 2020-05-03 DIAGNOSIS — I4891 Unspecified atrial fibrillation: Secondary | ICD-10-CM | POA: Diagnosis not present

## 2020-05-03 DIAGNOSIS — E1165 Type 2 diabetes mellitus with hyperglycemia: Secondary | ICD-10-CM | POA: Diagnosis not present

## 2020-05-03 DIAGNOSIS — I5022 Chronic systolic (congestive) heart failure: Secondary | ICD-10-CM | POA: Diagnosis not present

## 2020-05-05 DIAGNOSIS — R7989 Other specified abnormal findings of blood chemistry: Secondary | ICD-10-CM | POA: Diagnosis not present

## 2020-05-05 DIAGNOSIS — E7800 Pure hypercholesterolemia, unspecified: Secondary | ICD-10-CM | POA: Diagnosis not present

## 2020-05-05 DIAGNOSIS — D582 Other hemoglobinopathies: Secondary | ICD-10-CM | POA: Diagnosis not present

## 2020-05-05 DIAGNOSIS — E1165 Type 2 diabetes mellitus with hyperglycemia: Secondary | ICD-10-CM | POA: Diagnosis not present

## 2020-06-14 DIAGNOSIS — E7800 Pure hypercholesterolemia, unspecified: Secondary | ICD-10-CM | POA: Diagnosis not present

## 2020-06-14 DIAGNOSIS — I4891 Unspecified atrial fibrillation: Secondary | ICD-10-CM | POA: Diagnosis not present

## 2020-06-14 DIAGNOSIS — E1165 Type 2 diabetes mellitus with hyperglycemia: Secondary | ICD-10-CM | POA: Diagnosis not present

## 2020-06-14 DIAGNOSIS — I5022 Chronic systolic (congestive) heart failure: Secondary | ICD-10-CM | POA: Diagnosis not present

## 2020-06-27 ENCOUNTER — Ambulatory Visit (INDEPENDENT_AMBULATORY_CARE_PROVIDER_SITE_OTHER): Payer: Medicare HMO | Admitting: Podiatry

## 2020-06-27 ENCOUNTER — Other Ambulatory Visit: Payer: Self-pay

## 2020-06-27 ENCOUNTER — Encounter: Payer: Self-pay | Admitting: Podiatry

## 2020-06-27 DIAGNOSIS — M79675 Pain in left toe(s): Secondary | ICD-10-CM

## 2020-06-27 DIAGNOSIS — M79674 Pain in right toe(s): Secondary | ICD-10-CM | POA: Diagnosis not present

## 2020-06-27 DIAGNOSIS — B351 Tinea unguium: Secondary | ICD-10-CM

## 2020-06-29 NOTE — Progress Notes (Signed)
Patient presents subjective:   Patient ID: Teresa Peck, female   DOB: 80 y.o.   MRN: [id number]   HPI patient presents stating that she has thick nailbeds 1-5 both feet that she cannot take care of and she should have been here earlier.  Patient does not currently smoke and is not active    Review of Systems  All other systems reviewed and are negative.       Objective:  Physical Exam Vitals and nursing note reviewed.  Constitutional:      Appearance: She is well-developed.  Pulmonary:     Effort: Pulmonary effort is normal.  Musculoskeletal:        General: Normal range of motion.  Skin:    General: Skin is warm.  Neurological:     Mental Status: She is alert.     Neurovascular status intact with chronic yellow brittle nailbeds 1-5 both feet painful with good digital perfusion well oriented x3 range of motion subtalar midtarsal joint mildly diminished muscle strength adequate     Assessment:  Chronic mycotic nail infections 1-5 both feet in fair health individual     Plan:  H&P reviewed condition debridement of nailbeds 1-5 both feet no iatrogenic bleeding reappoint routine care

## 2020-06-30 ENCOUNTER — Other Ambulatory Visit: Payer: Self-pay | Admitting: Cardiovascular Disease

## 2020-08-01 ENCOUNTER — Ambulatory Visit (HOSPITAL_COMMUNITY)
Admission: EM | Admit: 2020-08-01 | Discharge: 2020-08-01 | Disposition: A | Discharge status: Home / Self Care | Payer: Medicare HMO | Attending: Family Medicine | Admitting: Family Medicine

## 2020-08-01 ENCOUNTER — Other Ambulatory Visit: Payer: Self-pay

## 2020-08-01 ENCOUNTER — Encounter (HOSPITAL_COMMUNITY): Payer: Self-pay

## 2020-08-01 DIAGNOSIS — M5416 Radiculopathy, lumbar region: Secondary | ICD-10-CM

## 2020-08-01 MED ORDER — HYDROCODONE-ACETAMINOPHEN 5-325 MG PO TABS: 1.0000 | ORAL_TABLET | Freq: Three times a day (TID) | ORAL | 0 refills | Status: DC | PRN

## 2020-08-01 MED ORDER — METHYLPREDNISOLONE ACETATE 40 MG/ML IJ SUSP
INTRAMUSCULAR | Status: AC
  Filled 2020-08-01: qty 1

## 2020-08-01 MED ORDER — METHYLPREDNISOLONE ACETATE 40 MG/ML IJ SUSP
40.0000 mg | Freq: Once | INTRAMUSCULAR | Status: AC
  Administered 2020-08-01: 40 mg via INTRAMUSCULAR

## 2020-08-01 NOTE — ED Triage Notes (Signed)
Pt reports severe middle to lower back pain extending into left hip and down left leg starting Friday, worsening today. Reports back surgery several years ago.  Has tried tylenol arthritis and biofreeze with minimal relief.

## 2020-08-01 NOTE — Discharge Instructions (Addendum)
Please try heat on the lower back  Please check your blood sugar  Please take stool softener  Please take the pain medicine as needed  Please follow up if your pain doesn't improve.

## 2020-08-01 NOTE — ED Provider Notes (Signed)
[facility]    CSN: [id number] Arrival date & time: 08/01/20  1955      History   Chief Complaint Chief Complaint  Patient presents with   Back Pain   Hip Pain   Leg Pain    HPI Teresa Peck is a 80 y.o. female presenting with low back and left leg pain.  Pain is been ongoing for the past few days.  No improvement with Tylenol.  No injury or inciting event.  No falls.  Describes the pain as sharp in nature.  Has a history of back surgery for a few years ago.  Denies any numbness or tingling.  Denies any weakness.   HPI  Past Medical History:  Diagnosis Date   Anemia    Arthritis    CAD (coronary artery disease)    CAD (coronary artery disease)    Cancer (H[facility])    skin cancer   CHF (congestive heart failure) (H[facility])    Coronary artery disease    MI 2004-stent   Diabetes mellitus    Diabetic neuropathy ([facility])    GERD (gastroesophageal reflux disease)    tums   Gout    Hiatal hernia    Hyperlipidemia    Hypertension    PAD (peripheral artery disease) ([place])    Tremor     Patient Active Problem List   Diagnosis Date Noted   Acute lower UTI 09/07/2018   GERD (gastroesophageal reflux disease) 09/07/2018   Weakness generalized 09/07/2018   Pressure injury of skin 09/07/2018   Chronic atrial fibrillation ([place]) 10/30/2016   Lumbar stenosis with neurogenic claudication 90/30/0923   Chronic systolic CHF (congestive heart failure) ([place]) 10/19/2014   Type 2 diabetes mellitus with complication, without long-term current use of insulin ([place]) 11/08/2009   Hyperlipidemia 11/08/2009   CAD (coronary artery disease) 11/08/2009   CARDIOMYOPATHY, ISCHEMIC 11/08/2009    Past Surgical History:  Procedure Laterality Date   BILATERAL CORNEA IMPLANTS  2010   CARDIAC CATHETERIZATION     stent post MI    CHOLECYSTECTOMY     CORONARY ANGIOPLASTY     2004  stent   EYE SURGERY     KNEE SURGERY  1974   right   LUMBAR LAMINECTOMY/DECOMPRESSION MICRODISCECTOMY N/A  04/13/2015   Procedure: L4-5 Decompression;  Surgeon: Marybelle Killings, MD;  Location: [place];  Service: Orthopedics;  Laterality: N/A;  Need RNFA    OB History   No obstetric history on file.      Home Medications    Prior to Admission medications   Medication Sig Start Date End Date Taking? Authorizing Provider  acetaminophen (TYLENOL) 325 MG tablet Take 2 tablets (650 mg total) by mouth every 6 (six) hours as needed for mild pain or headache. 09/09/18  Yes Kathie Dike, MD  allopurinol (ZYLOPRIM) 300 MG tablet Take 300 mg by mouth daily.   Yes [provider]  apixaban (ELIQUIS) 2.5 MG TABS tablet Take 1 tablet (2.5 mg total) by mouth 2 (two) times daily. 02/03/20  Yes Nahser, Wonda Cheng, MD  Biotin 2500 MCG CAPS Take 2,500 mcg by mouth.   Yes [provider]  Calcium Carb-Cholecalciferol (CALCIUM + D3) 600-800 MG-UNIT TABS Take 1 tablet by mouth daily.   Yes [provider]  Cyanocobalamin (VITAMIN B12) 1000 MCG TBCR Take 1 tablet by mouth daily.   Yes [provider]  furosemide (LASIX) 40 MG tablet Take 20 mg by mouth 2 (two) times a week.   Yes [provider]  gabapentin (NEURONTIN) 600 MG tablet Take 600 mg by mouth 2 (two) times daily.     Yes [provider]  HYDROcodone-acetaminophen (NORCO/VICODIN) 5-325 MG tablet Take 1 tablet by mouth every 8 (eight) hours as needed for moderate pain. 08/01/20  Yes Rosemarie Ax, MD  Magnesium Oxide 400 (240 Mg) MG TABS Take 1 tablet (400 mg total) by mouth daily. 10/31/16  Yes Nahser, Wonda Cheng, MD  metoprolol succinate (TOPROL-XL) 25 MG 24 hr tablet Take 25 mg by mouth daily.   Yes [provider]  Multiple Vitamins-Minerals (CENTRUM SILVER 50+WOMEN) TABS Take 1 tablet by mouth daily.   Yes [provider]  nitroGLYCERIN (NITROSTAT) 0.4 MG SL tablet PLACE 1 TABLET UNDER THE TONGUE EVERY 5 MINUTES AS NEEDED FOR  CHEST  PAIN 06/30/20  Yes Nahser, Wonda Cheng, MD  rosuvastatin  (CRESTOR) 10 MG tablet Take 10 mg by mouth daily.   Yes [provider]  UNABLE TO FIND Med Name: pt reports she is using a different insulin pen but does not remember the name of the insulin.   Yes [provider]  UNABLE TO FIND Med Name: reports taking otc stool softener as needed   Yes [provider]  ezetimibe (ZETIA) 10 MG tablet Take 1 tablet by mouth daily. 02/02/20   [provider]  insulin glargine (LANTUS) 100 unit/mL SOPN Inject 0.1 mLs (10 Units total) into the skin daily. 09/05/18   [place] Eric J, DO  senna-docusate (SENOKOT-S) 8.6-50 MG tablet Take 1 tablet by mouth 2 (two) times daily. 09/10/18   Domenic Polite, MD  TRUE METRIX BLOOD GLUCOSE TEST test strip 1 each by Other route as needed. 06/24/19   [provider]    Family History Family History  Problem Relation Age of Onset   Heart attack Father    Heart attack Sister    Heart attack Brother    CAD Mother    Stroke Mother    Heart disease Mother    Cancer Mother     Social History Social History   Tobacco Use   Smoking status: Former    Packs/day: 0.50    Years: 5.00    Pack years: 2.50    Types: Cigarettes    Quit date: 02/19/1978    Years since quitting: 42.4   Smokeless tobacco: Never   Tobacco comments:    Quit 1982  Vaping Use   Vaping Use: Never used  Substance Use Topics   Alcohol use: No    Alcohol/week: 0.0 standard drinks   Drug use: No     Allergies   Elemental sulfur   Review of Systems Review of Systems See HPI  Physical Exam Triage Vital Signs ED Triage Vitals  Enc Vitals Group     BP      Pulse      Resp      Temp      Temp src      SpO2      Weight      Height      Head Circumference      Peak Flow      Pain Score      Pain Loc      Pain Edu?      Excl. in [place]?    No data found.  Updated Vital Signs BP (!) 139/94   Pulse (S) 67 Comment: variable heart rate, dinamap reading 40s-130s. Raeford Razor MD aware  Temp 98.6 F  (  37 C)   Resp 18   SpO2 100%   Visual Acuity Right Eye Distance:   Left Eye Distance:   Bilateral Distance:    Right Eye Near:   Left Eye Near:    Bilateral Near:     Physical Exam Gen: NAD, alert, cooperative with exam, well-appearing ENT: normal lips, normal nasal mucosa,  Eye: normal EOM, normal conjunctiva and lids Neuro: normal tone, normal sensation to touch Psych:  normal insight, alert and oriented MSK:  Back/left hip:  Normal strength with hip flexion. Normal internal rotation. Tenderness palpation of the trochanter. Negative straight leg raise. Normal strength with plantarflexion and dorsiflexion. Neurovascularly intact   UC Treatments / Results  Labs (all labs ordered are listed, but only abnormal results are displayed) Labs Reviewed - No data to display  EKG   Radiology No results found.  Procedures Procedures (including critical care time)  Medications Ordered in UC Medications  methylPREDNISolone acetate (DEPO-MEDROL) injection 40 mg (40 mg Intramuscular Given 08/01/20 2123)    Initial Impression / Assessment and Plan / UC Course  I have reviewed the triage vital signs and the nursing notes.  Pertinent labs & imaging results that were available during my care of the patient were reviewed by me and considered in my medical decision making (see chart for details).     Teresa Peck is an 80 year old female that is presenting with left leg pain that seems most consistent with radiculopathy.  She does have spinal stenosis and severe degenerative changes based on previous x-rays and MRIs.  She reports her last A1c was at 7.  Will provide intramuscular Depo-Medrol.  Provided Norco for severe pain and counseled on its use.  Counseled on supportive care and follow-up.  Final Clinical Impressions(s) / UC Diagnoses   Final diagnoses:  Lumbar radiculopathy     Discharge Instructions      Please try heat on the lower back  Please check your blood  sugar  Please take stool softener  Please take the pain medicine as needed  Please follow up if your pain doesn't improve.      ED Prescriptions     Medication Sig Dispense Auth. Provider   HYDROcodone-acetaminophen (NORCO/VICODIN) 5-325 MG tablet Take 1 tablet by mouth every 8 (eight) hours as needed for moderate pain. 15 tablet Rosemarie Ax, MD      I have reviewed the PDMP during this encounter.   Rosemarie Ax, MD 08/01/20 2133

## 2020-08-05 ENCOUNTER — Ambulatory Visit (HOSPITAL_COMMUNITY)
Admission: EM | Admit: 2020-08-05 | Discharge: 2020-08-05 | Disposition: A | Discharge status: Home / Self Care | Payer: Medicare HMO | Attending: Family | Admitting: Family

## 2020-08-05 ENCOUNTER — Other Ambulatory Visit: Payer: Self-pay

## 2020-08-05 ENCOUNTER — Ambulatory Visit (INDEPENDENT_AMBULATORY_CARE_PROVIDER_SITE_OTHER): Payer: Medicare HMO

## 2020-08-05 ENCOUNTER — Encounter (HOSPITAL_COMMUNITY): Payer: Self-pay | Admitting: Emergency Medicine

## 2020-08-05 DIAGNOSIS — M545 Low back pain, unspecified: Secondary | ICD-10-CM | POA: Diagnosis not present

## 2020-08-05 DIAGNOSIS — M5442 Lumbago with sciatica, left side: Secondary | ICD-10-CM

## 2020-08-05 MED ORDER — PREDNISONE 10 MG (21) PO TBPK: ORAL_TABLET | Freq: Every day | ORAL | 0 refills | Status: DC

## 2020-08-05 NOTE — Discharge Instructions (Addendum)
Recommend trial Prednisone 10mg  tablets- take 6 tablets today then decrease by 1 tablet each day until finished on day 6. May continue Tylenol 650mg  every 6 to 8 hours as needed. May take Vicodin 1 tablet at night to help with sleep- if you take Vicodin, do not take any Tylenol around the same time. Recommend call your Orthopedic, Dr. Lorin Mercy today to schedule appointment for further evaluation. If pain does not improve within the next 3 days, recommend go to the ER for further evaluation.

## 2020-08-05 NOTE — ED Provider Notes (Signed)
[place]    CSN: [id number] Arrival date & time: 08/05/20  0263      History   Chief Complaint Chief Complaint  Patient presents with   Back Pain    HPI Teresa Peck is a 80 y.o. female.   80 year old female presents with acute flare up of lower lumbar pain, more on left side, that is getting worse. Was seen here at Urgent Care 4 days ago with same complaint. Has history of chronic lower back pain, lumbar stenosis and previous back surgery in 2017. Was give DepoMedrol shot here at Urgent Care and sent home with Vicodin which is not helping. Pain is getting worse with more radiation down side and front of left leg (not right leg which is documented in nursing triage note). Patient requests x-ray to confirm no new fracture. No distinct trauma or fall. Having difficulty lifting left leg due to pain. Has taken Prednisone in the past with some success. Multiple other chronic health issues including CAD, CHF, type 2 DM, hyperlipidemia, HTN, Peripheral artery disease, Gout, Diabetic neuropathy, GERD, Atrial fibrillation, arthritis and anemia. Currently on Toprol XL, Eliquis, Crestor, Zetia, Lasix, Allopurinol, Neurontin, and insulin daily and Nitroglycerin, Senokot and Tylenol prn.   The history is provided by the patient.   Past Medical History:  Diagnosis Date   Anemia    Arthritis    CAD (coronary artery disease)    CAD (coronary artery disease)    Cancer (HCC)    skin cancer   CHF (congestive heart failure) (HCC)    Coronary artery disease    MI 2004-stent   Diabetes mellitus    Diabetic neuropathy (HCC)    GERD (gastroesophageal reflux disease)    tums   Gout    Hiatal hernia    Hyperlipidemia    Hypertension    PAD (peripheral artery disease) ([place])    Tremor     Patient Active Problem List   Diagnosis Date Noted   Acute lower UTI 09/07/2018   GERD (gastroesophageal reflux disease) 09/07/2018   Weakness generalized 09/07/2018   Pressure injury of  skin 09/07/2018   Chronic atrial fibrillation ([place]) 10/30/2016   Lumbar stenosis with neurogenic claudication 78/58/8502   Chronic systolic CHF (congestive heart failure) ([place]) 10/19/2014   Type 2 diabetes mellitus with complication, without long-term current use of insulin ([place]) 11/08/2009   Hyperlipidemia 11/08/2009   CAD (coronary artery disease) 11/08/2009   CARDIOMYOPATHY, ISCHEMIC 11/08/2009    Past Surgical History:  Procedure Laterality Date   BILATERAL CORNEA IMPLANTS  2010   CARDIAC CATHETERIZATION     stent post MI    CHOLECYSTECTOMY     CORONARY ANGIOPLASTY     2004  stent   EYE SURGERY     KNEE SURGERY  1974   right   LUMBAR LAMINECTOMY/DECOMPRESSION MICRODISCECTOMY N/A 04/13/2015   Procedure: L4-5 Decompression;  Surgeon: Marybelle Killings, MD;  Location: [facility];  Service: Orthopedics;  Laterality: N/A;  Need RNFA    OB History   No obstetric history on file.      Home Medications    Prior to Admission medications   Medication Sig Start Date End Date Taking? Authorizing Provider  predniSONE (STERAPRED UNI-PAK 21 TAB) 10 MG (21) TBPK tablet Take by mouth daily. Take 6 tabs by mouth on first day then decrease by 1 tablet each day until finished on day 6. 08/05/20  Yes Hildegarde Dunaway, Nicholes Stairs, NP  acetaminophen (TYLENOL) 325 MG tablet Take  2 tablets (650 mg total) by mouth every 6 (six) hours as needed for mild pain or headache. 09/09/18   Kathie Dike, MD  allopurinol (ZYLOPRIM) 300 MG tablet Take 300 mg by mouth daily.    [provider]  apixaban (ELIQUIS) 2.5 MG TABS tablet Take 1 tablet (2.5 mg total) by mouth 2 (two) times daily. 02/03/20   Nahser, Wonda Cheng, MD  Biotin 2500 MCG CAPS Take 2,500 mcg by mouth.    [provider]  Calcium Carb-Cholecalciferol (CALCIUM + D3) 600-800 MG-UNIT TABS Take 1 tablet by mouth daily.    [provider]  Cyanocobalamin (VITAMIN B12) 1000 MCG TBCR Take 1 tablet by mouth daily.    [provider]   ezetimibe (ZETIA) 10 MG tablet Take 1 tablet by mouth daily. 02/02/20   [provider]  furosemide (LASIX) 40 MG tablet Take 20 mg by mouth 2 (two) times a week.    [provider]  gabapentin (NEURONTIN) 600 MG tablet Take 600 mg by mouth 2 (two) times daily.      [provider]  HYDROcodone-acetaminophen (NORCO/VICODIN) 5-325 MG tablet Take 1 tablet by mouth every 8 (eight) hours as needed for moderate pain. 08/01/20   Rosemarie Ax, MD  Magnesium Oxide 400 (240 Mg) MG TABS Take 1 tablet (400 mg total) by mouth daily. 10/31/16   Nahser, Wonda Cheng, MD  metoprolol succinate (TOPROL-XL) 25 MG 24 hr tablet Take 25 mg by mouth daily.    [provider]  Multiple Vitamins-Minerals (CENTRUM SILVER 50+WOMEN) TABS Take 1 tablet by mouth daily.    [provider]  nitroGLYCERIN (NITROSTAT) 0.4 MG SL tablet PLACE 1 TABLET UNDER THE TONGUE EVERY 5 MINUTES AS NEEDED FOR  CHEST  PAIN 06/30/20   Nahser, Wonda Cheng, MD  rosuvastatin (CRESTOR) 10 MG tablet Take 10 mg by mouth daily.    [provider]  senna-docusate (SENOKOT-S) 8.6-50 MG tablet Take 1 tablet by mouth 2 (two) times daily. 09/10/18   Domenic Polite, MD  TRUE METRIX BLOOD GLUCOSE TEST test strip 1 each by Other route as needed. 06/24/19   [provider]  UNABLE TO FIND Med Name: pt reports she is using a different insulin pen but does not remember the name of the insulin.    [provider]  UNABLE TO FIND Med Name: reports taking otc stool softener as needed    [provider]    Family History Family History  Problem Relation Age of Onset   Heart attack Father    Heart attack Sister    Heart attack Brother    CAD Mother    Stroke Mother    Heart disease Mother    Cancer Mother     Social History Social History   Tobacco Use   Smoking status: Former    Packs/day: 0.50    Years: 5.00    Pack years: 2.50    Types: Cigarettes    Quit date: 02/19/1978     Years since quitting: 42.4   Smokeless tobacco: Never   Tobacco comments:    Quit 1982  Vaping Use   Vaping Use: Never used  Substance Use Topics   Alcohol use: No    Alcohol/week: 0.0 standard drinks   Drug use: No     Allergies   Elemental sulfur   Review of Systems Review of Systems  Constitutional:  Positive for activity change and fatigue. Negative for appetite change, chills, diaphoresis and fever.  Respiratory:  Negative for chest tightness and shortness of breath.   Gastrointestinal:  Negative for constipation, nausea and vomiting.  Genitourinary:  Negative for decreased urine volume, difficulty urinating, dysuria, flank pain and hematuria.  Musculoskeletal:  Positive for arthralgias, back pain, gait problem and myalgias. Negative for neck pain and neck stiffness.  Skin:  Negative for color change and rash.  Allergic/Immunologic: Negative for environmental allergies and food allergies.  Neurological:  Positive for numbness. Negative for dizziness, tremors, seizures, syncope, speech difficulty and light-headedness.  Hematological:  Negative for adenopathy. Bruises/bleeds easily.    Physical Exam Triage Vital Signs ED Triage Vitals  Enc Vitals Group     BP 08/05/20 1008 (!) 126/95     Pulse Rate 08/05/20 1008 80     Resp 08/05/20 1008 19     Temp 08/05/20 1008 (!) 97.5 F (36.4 C)     Temp Source 08/05/20 1008 Oral     SpO2 08/05/20 1008 97 %     Weight --      Height --      Head Circumference --      Peak Flow --      Pain Score 08/05/20 1006 10     Pain Loc --      Pain Edu? --      Excl. in [place]? --    No data found.  Updated Vital Signs BP (!) 126/95   Pulse 80   Temp (!) 97.5 F (36.4 C) (Oral)   Resp 19   SpO2 97%   Visual Acuity Right Eye Distance:   Left Eye Distance:   Bilateral Distance:    Right Eye Near:   Left Eye Near:    Bilateral Near:     Physical Exam Vitals and nursing note reviewed.  Constitutional:      General: She is  awake. She is not in acute distress.    Appearance: She is well-developed and well-groomed.     Comments: She is sitting in the exam chair in no acute distress but changes positions often due to significant pain and appears uncomfortable.   HENT:     Head: Normocephalic and atraumatic.     Right Ear: Hearing normal.     Left Ear: Hearing normal.  Eyes:     Extraocular Movements: Extraocular movements intact.     Conjunctiva/sclera: Conjunctivae normal.  Cardiovascular:     Rate and Rhythm: Normal rate and regular rhythm.     Heart sounds: Normal heart sounds. No murmur heard. Pulmonary:     Effort: Pulmonary effort is normal.     Breath sounds: Normal breath sounds.  Abdominal:     Tenderness: There is no right CVA tenderness or left CVA tenderness.  Musculoskeletal:        General: Tenderness present. No swelling.     Cervical back: Normal and normal range of motion.     Thoracic back: Normal.     Lumbar back: Tenderness present. No swelling, edema or spasms. Decreased range of motion. Positive left straight leg raise test. Negative right straight leg raise test.       Back:     Comments: Decreased range of motion of lumbar area of back, especially with rotation. Tender along left lower lumbar area. Scar from previous surgery noted midline lower lumbar. No distinct muscle spasms present. No rash or redness. Slight decreased sensation of left leg and foot. Good distal pulses.   Skin:    General: Skin is warm and dry.  Findings: No erythema or rash.  Neurological:     General: No focal deficit present.     Mental Status: She is alert and oriented to person, place, and time.     Sensory: Sensory deficit present.     Motor: Motor function is intact.     Gait: Gait is intact.     Comments: Slight decreased sensation on left leg, mostly left lower and left dorsal aspect of foot.   Psychiatric:        Attention and Perception: Attention normal.        Mood and Affect: Mood is  anxious.        Speech: Speech is rapid and pressured.        Behavior: Behavior is cooperative.        Thought Content: Thought content normal.        Cognition and Memory: Cognition and memory normal.        Judgment: Judgment normal.     UC Treatments / Results  Labs (all labs ordered are listed, but only abnormal results are displayed) Labs Reviewed - No data to display  EKG   Radiology DG Lumbar Spine Complete  Result Date: 08/05/2020 CLINICAL DATA:  Chronic low back pain with acute flare. No acute injury. Previous back surgery. EXAM: LUMBAR SPINE - COMPLETE 4+ VIEW COMPARISON:  Radiographs 10/02/2017. FINDINGS: There are 5 lumbar type vertebral bodies. The alignment is stable with a mild convex right scoliosis centered at L3-4. There is multilevel spondylosis with disc space narrowing and endplate osteophyte formation, similar to previous study. There may be interbody fusion at L2-3. No evidence of acute fracture or pars defect. Facet degenerative changes are present inferiorly. There is diffuse aortoiliac atherosclerosis. IMPRESSION: Multilevel spondylosis as described with stable alignment. No acute osseous findings. Electronically Signed   By: Richardean Sale M.D.   On: 08/05/2020 11:12    Procedures Procedures (including critical care time)  Medications Ordered in UC Medications - No data to display  Initial Impression / Assessment and Plan / UC Course  I have reviewed the triage vital signs and the nursing notes.  Pertinent labs & imaging results that were available during my care of the patient were reviewed by me and considered in my medical decision making (see chart for details).    Reviewed x-ray results with patient- no acute fracture. Continued disc space narrowing and spondylosis. Mild scoliosis and bone spur formation. Discussed that she needs to see her back surgeon for further evaluation and management. Will trial Prednisone 10mg  6 day dose pack as directed-  discussed that this medication can raise blood pressure, elevated sugar level and increase chance of bleeding but limited alternatives available. May continue Tylenol 650mg  as directed. May take Vicodin 1 tablet at night to help with sleep. If she takes Vicodin, do not take Tylenol around the same time. Call Orthopedic surgeon Dr. Lorin Mercy today to schedule appointment for further evaluation. If pain does not improve within the next 3 days or any loss of bowel or bladder control, recommend go to the ER ASAP for further evaluation. Otherwise, follow-up with Orthopedic as planned.  Final Clinical Impressions(s) / UC Diagnoses   Final diagnoses:  Acute left-sided low back pain with left-sided sciatica     Discharge Instructions      Recommend trial Prednisone 10mg  tablets- take 6 tablets today then decrease by 1 tablet each day until finished on day 6. May continue Tylenol 650mg  every 6 to 8 hours as needed.  May take Vicodin 1 tablet at night to help with sleep- if you take Vicodin, do not take any Tylenol around the same time. Recommend call your Orthopedic, Dr. Lorin Mercy today to schedule appointment for further evaluation. If pain does not improve within the next 3 days, recommend go to the ER for further evaluation.      ED Prescriptions     Medication Sig Dispense Auth. Provider   predniSONE (STERAPRED UNI-PAK 21 TAB) 10 MG (21) TBPK tablet Take by mouth daily. Take 6 tabs by mouth on first day then decrease by 1 tablet each day until finished on day 6. 21 tablet Jahziel Sinn, Nicholes Stairs, NP      PDMP not reviewed this encounter.   Katy Apo, NP 08/06/20 1408

## 2020-08-05 NOTE — ED Triage Notes (Signed)
Pt is present today with lower back pain that radiates down her right leg. Pt states that this pain has been recurrent.

## 2020-08-16 DIAGNOSIS — I5022 Chronic systolic (congestive) heart failure: Secondary | ICD-10-CM | POA: Diagnosis not present

## 2020-08-16 DIAGNOSIS — Z794 Long term (current) use of insulin: Secondary | ICD-10-CM | POA: Diagnosis not present

## 2020-08-16 DIAGNOSIS — E7800 Pure hypercholesterolemia, unspecified: Secondary | ICD-10-CM | POA: Diagnosis not present

## 2020-08-16 DIAGNOSIS — E1165 Type 2 diabetes mellitus with hyperglycemia: Secondary | ICD-10-CM | POA: Diagnosis not present

## 2020-08-17 ENCOUNTER — Encounter: Payer: Self-pay | Admitting: Surgery

## 2020-08-17 ENCOUNTER — Ambulatory Visit: Payer: Medicare HMO | Admitting: Surgery

## 2020-08-17 VITALS — BP 131/88 | HR 76 | Ht 64.0 in | Wt 176.0 lb

## 2020-08-17 DIAGNOSIS — M7062 Trochanteric bursitis, left hip: Secondary | ICD-10-CM

## 2020-08-18 DIAGNOSIS — E7800 Pure hypercholesterolemia, unspecified: Secondary | ICD-10-CM | POA: Diagnosis not present

## 2020-08-18 DIAGNOSIS — I4891 Unspecified atrial fibrillation: Secondary | ICD-10-CM | POA: Diagnosis not present

## 2020-08-18 DIAGNOSIS — E1165 Type 2 diabetes mellitus with hyperglycemia: Secondary | ICD-10-CM | POA: Diagnosis not present

## 2020-08-18 DIAGNOSIS — I5022 Chronic systolic (congestive) heart failure: Secondary | ICD-10-CM | POA: Diagnosis not present

## 2020-08-24 ENCOUNTER — Encounter: Payer: Self-pay | Admitting: Surgery

## 2020-08-24 ENCOUNTER — Other Ambulatory Visit: Payer: Self-pay

## 2020-08-24 ENCOUNTER — Ambulatory Visit: Payer: Medicare HMO | Admitting: Surgery

## 2020-08-24 VITALS — BP 124/66 | HR 73 | Ht 64.0 in | Wt 176.0 lb

## 2020-08-24 DIAGNOSIS — M7062 Trochanteric bursitis, left hip: Secondary | ICD-10-CM | POA: Diagnosis not present

## 2020-08-24 NOTE — Progress Notes (Deleted)
Office Visit Note   Patient: Teresa Peck           Date of Birth: 01/18/1941           MRN: [id number] Visit Date: 08/17/2020              Requested by: Deland Pretty, MD [address] [place],  [place] [address] PCP: Deland Pretty, MD   Assessment & Plan: Visit Diagnoses:  1. Greater trochanteric bursitis of left hip     Plan: ***  Follow-Up Instructions: Return in about 6 weeks (around 09/28/2020) for with dr yates for recheck left hip.   Orders:  No orders of the defined types were placed in this encounter.  No orders of the defined types were placed in this encounter.     Procedures: Large Joint Inj: L greater trochanter on 08/24/2020 12:27 PM Indications: pain Details: 22 G 3.5 in needle, lateral approach Medications: 6 mL bupivacaine 0.25 % Outcome: tolerated well, no immediate complications  Marcaine/betamethasone 6:1 Consent was given by the patient.      Clinical Data: No additional findings.   Subjective: Chief Complaint  Patient presents with  . Lower Back - Pain    HPI  Review of Systems   Objective: Vital Signs: BP 131/88   Pulse 76   Ht 5\' 4"  (1.626 m)   Wt 176 lb (79.8 kg)   BMI 30.21 kg/m   Physical Exam  Ortho Exam  Specialty Comments:  No specialty comments available.  Imaging: No results found.   PMFS History: Patient Active Problem List   Diagnosis Date Noted  . Acute lower UTI 09/07/2018  . GERD (gastroesophageal reflux disease) 09/07/2018  . Weakness generalized 09/07/2018  . Pressure injury of skin 09/07/2018  . Chronic atrial fibrillation ([place]) 10/30/2016  . Lumbar stenosis with neurogenic claudication 04/13/2015  . Chronic systolic CHF (congestive heart failure) ([place]) 10/19/2014  . Type 2 diabetes mellitus with complication, without long-term current use of insulin ([place]) 11/08/2009  . Hyperlipidemia 11/08/2009  . CAD (coronary artery disease) 11/08/2009  . CARDIOMYOPATHY, ISCHEMIC  11/08/2009   Past Medical History:  Diagnosis Date  . Anemia   . Arthritis   . CAD (coronary artery disease)   . CAD (coronary artery disease)   . Cancer ([place])    skin cancer  . CHF (congestive heart failure) ([place])   . Coronary artery disease    MI 2004-stent  . Diabetes mellitus   . Diabetic neuropathy ([place])   . GERD (gastroesophageal reflux disease)    tums  . Gout   . Hiatal hernia   . Hyperlipidemia   . Hypertension   . PAD (peripheral artery disease) ([place])   . Tremor     Family History  Problem Relation Age of Onset  . Heart attack Father   . Heart attack Sister   . Heart attack Brother   . CAD Mother   . Stroke Mother   . Heart disease Mother   . Cancer Mother     Past Surgical History:  Procedure Laterality Date  . BILATERAL CORNEA IMPLANTS  2010  . CARDIAC CATHETERIZATION     stent post MI   . CHOLECYSTECTOMY    . CORONARY ANGIOPLASTY     2004  stent  . EYE SURGERY    . [place]   right  . LUMBAR LAMINECTOMY/DECOMPRESSION MICRODISCECTOMY N/A 04/13/2015   Procedure: L4-5 Decompression;  Surgeon: Marybelle Killings, MD;  Location: [facility];  Service: Orthopedics;  Laterality: N/A;  Need RNFA   Social History   Occupational History  . Occupation: N/A  Tobacco Use  . Smoking status: Former    Packs/day: 0.50    Years: 5.00    Pack years: 2.50    Types: Cigarettes    Quit date: 02/19/1978    Years since quitting: 42.5  . Smokeless tobacco: Never  . Tobacco comments:    Quit 1982  Vaping Use  . Vaping Use: Never used  Substance and Sexual Activity  . Alcohol use: No    Alcohol/week: 0.0 standard drinks  . Drug use: No  . Sexual activity: Not on file

## 2020-08-29 ENCOUNTER — Encounter: Payer: Self-pay | Admitting: Surgery

## 2020-08-29 DIAGNOSIS — M7062 Trochanteric bursitis, left hip: Secondary | ICD-10-CM

## 2020-08-29 MED ORDER — BUPIVACAINE HCL 0.25 % IJ SOLN
6.0000 mL | INTRAMUSCULAR | Status: AC | PRN
  Administered 2020-08-29: 6 mL via INTRA_ARTICULAR

## 2020-08-29 MED ORDER — LIDOCAINE HCL 1 % IJ SOLN
3.0000 mL | INTRAMUSCULAR | Status: AC | PRN
  Administered 2020-08-29: 3 mL

## 2020-08-29 NOTE — Progress Notes (Signed)
Office Visit Note   Patient: Teresa Peck           Date of Birth: 17-May-1940           MRN: [id number] Visit Date: 08/24/2020              Requested by: Deland Pretty, MD [address] [place],  [place] [address] PCP: Deland Pretty, MD   Assessment & Plan: Visit Diagnoses:  1. Greater trochanteric bursitis of left hip     Plan: Today fingerstick glucose in clinic 112.  Today I did elect to perform a left greater trochanter bursa injection.  Patient consent left lateral hip was prepped with Betadine and greater trochanter bursa Marcaine/betamethasone 6 to 1 injection performed.  After sitting for a few minutes patient reported good relief of her lateral hip pain.  Follow-up in 6 weeks with Dr. Lorin Mercy for recheck.  He can decide if further imaging studies of her lumbar spine is indicated.  Follow-Up Instructions: Return in about 6 weeks (around 10/05/2020) for With Dr. Lorin Mercy.   Orders:  No orders of the defined types were placed in this encounter.  No orders of the defined types were placed in this encounter.     Procedures: Large Joint Inj: L greater trochanter on 08/29/2020 2:37 PM Details: 22 G 3.5 in needle, lateral approach Medications: 3 mL lidocaine 1 %; 6 mL bupivacaine 0.25 % Outcome: tolerated well, no immediate complications  Marcaine/betamethasone 6-1 greater trochanter bursa injection performed.  Tolerated without complication.  Patient did have good relief of her lateral hip pain with anesthetic in place. Consent was given by the patient. Patient was prepped and draped in the usual sterile fashion.      Clinical Data: No additional findings.   Subjective: Chief Complaint  Patient presents with   Left Hip - Pain    HPI 80 year old white female returns with complaints of low back pain and left lateral hip pain.  I advised patient that if her blood sugars were improved but I would inject her left hip greater trochanter bursa.  Continues  have pain in the lateral hip with ambulation and laying on her left side.  She also continues to have back pain and left buttock pain.  She is wanting to proceed with the lateral hip injection.   Objective: Vital Signs: BP 124/66   Pulse 73   Ht 5\' 4"  (1.626 m)   Wt 176 lb (79.8 kg)   BMI 30.21 kg/m   Physical Exam Gait is antalgic.  Patient has marked tenderness over the left hip greater trochanter bursa.  Negative logroll.  Negative straight leg raise. Ortho Exam  Specialty Comments:  No specialty comments available.  Imaging: No results found.   PMFS History: Patient Active Problem List   Diagnosis Date Noted   Acute lower UTI 09/07/2018   GERD (gastroesophageal reflux disease) 09/07/2018   Weakness generalized 09/07/2018   Pressure injury of skin 09/07/2018   Chronic atrial fibrillation ([place]) 10/30/2016   Lumbar stenosis with neurogenic claudication 28/78/6767   Chronic systolic CHF (congestive heart failure) ([place]) 10/19/2014   Type 2 diabetes mellitus with complication, without long-term current use of insulin ([place]) 11/08/2009   Hyperlipidemia 11/08/2009   CAD (coronary artery disease) 11/08/2009   CARDIOMYOPATHY, ISCHEMIC 11/08/2009   Past Medical History:  Diagnosis Date   Anemia    Arthritis    CAD (coronary artery disease)    CAD (coronary artery disease)    Cancer ([place])  skin cancer   CHF (congestive heart failure) (H[facility])    Coronary artery disease    MI 2004-stent   Diabetes mellitus    Diabetic neuropathy (H[facility])    GERD (gastroesophageal reflux disease)    tums   Gout    Hiatal hernia    Hyperlipidemia    Hypertension    PAD (peripheral artery disease) ([facility])    Tremor     Family History  Problem Relation Age of Onset   Heart attack Father    Heart attack Sister    Heart attack Brother    CAD Mother    Stroke Mother    Heart disease Mother    Cancer Mother     Past Surgical History:  Procedure Laterality Date   BILATERAL CORNEA  IMPLANTS  2010   CARDIAC CATHETERIZATION     stent post MI    CHOLECYSTECTOMY     CORONARY ANGIOPLASTY     2004  stent   EYE SURGERY     KNEE SURGERY  1974   right   LUMBAR LAMINECTOMY/DECOMPRESSION MICRODISCECTOMY N/A 04/13/2015   Procedure: L4-5 Decompression;  Surgeon: Marybelle Killings, MD;  Location: [facility];  Service: Orthopedics;  Laterality: N/A;  Need RNFA   Social History   Occupational History   Occupation: N/A  Tobacco Use   Smoking status: Former    Packs/day: 0.50    Years: 5.00    Pack years: 2.50    Types: Cigarettes    Quit date: 02/19/1978    Years since quitting: 42.5   Smokeless tobacco: Never   Tobacco comments:    Quit 1982  Vaping Use   Vaping Use: Never used  Substance and Sexual Activity   Alcohol use: No    Alcohol/week: 0.0 standard drinks   Drug use: No   Sexual activity: Not on file

## 2020-08-30 NOTE — Progress Notes (Signed)
Office Visit Note   Patient: Teresa Peck           Date of Birth: June 10, 1940           MRN: [id number] Visit Date: 08/17/2020              Requested by: Deland Pretty, MD [place],  [place] [address] PCP: Deland Pretty, MD   Assessment & Plan: Visit Diagnoses:  1. Greater trochanteric bursitis of left hip     Plan: Since patient is primarily complaining of left lateral hip pain I had recommended doing a greater trochanter bursa injection.  Patient's finger stick glucose was elevated so he chose not to do this today.  We will follow-up in 1 week for recheck and if her blood sugar is down I will plan to perform the injection at that time.  Follow-Up Instructions: Return in about 6 weeks (around 09/28/2020) for with dr yates for recheck left hip.   Orders:  No orders of the defined types were placed in this encounter.  No orders of the defined types were placed in this encounter.     Procedures: No procedures performed   Clinical Data: No additional findings.   Subjective: Chief Complaint  Patient presents with   Lower Back - Pain    HPI 80 year old female comes in today with complaints of low back pain, left buttock and hip pain.  Patient has known history of lumbar spondylosis.  Patient was seen at the urgent care August 05, 2020 and diagnosed with left-sided low back pain with left-sided sciatica.  She was given what sounds to be a a steroid biotic IM injection and she was also prescribed a prednisone taper.  States that this did not help.  Pain low back that radiates into the left buttock and some down her left leg.  She also complains of bladder discomfort over the left lateral hip.  This pain is aggravated when she is ambulating and laying on her left side. Review of Systems No current cardiac pulmonary GI GU issues  Objective: Vital Signs: BP 131/88   Pulse 76   Ht 5\' 4"  (1.626 m)   Wt 176 lb (79.8 kg)   BMI 30.21 kg/m    Physical Exam  Ortho Exam  Specialty Comments:  No specialty comments available.  Imaging: No results found.   PMFS History: Patient Active Problem List   Diagnosis Date Noted   Acute lower UTI 09/07/2018   GERD (gastroesophageal reflux disease) 09/07/2018   Weakness generalized 09/07/2018   Pressure injury of skin 09/07/2018   Chronic atrial fibrillation ([facility]) 10/30/2016   Lumbar stenosis with neurogenic claudication 34/74/2595   Chronic systolic CHF (congestive heart failure) ([facility]) 10/19/2014   Type 2 diabetes mellitus with complication, without long-term current use of insulin ([facility]) 11/08/2009   Hyperlipidemia 11/08/2009   CAD (coronary artery disease) 11/08/2009   CARDIOMYOPATHY, ISCHEMIC 11/08/2009   Past Medical History:  Diagnosis Date   Anemia    Arthritis    CAD (coronary artery disease)    CAD (coronary artery disease)    Cancer (HCC)    skin cancer   CHF (congestive heart failure) (HCC)    Coronary artery disease    MI 2004-stent   Diabetes mellitus    Diabetic neuropathy (HCC)    GERD (gastroesophageal reflux disease)    tums   Gout    Hiatal hernia    Hyperlipidemia    Hypertension    PAD (peripheral  artery disease) ([place] [facility])    Tremor     Family History  Problem Relation Age of Onset   Heart attack Father    Heart attack Sister    Heart attack Brother    CAD Mother    Stroke Mother    Heart disease Mother    Cancer Mother     Past Surgical History:  Procedure Laterality Date   BILATERAL CORNEA IMPLANTS  2010   CARDIAC CATHETERIZATION     stent post MI    CHOLECYSTECTOMY     CORONARY ANGIOPLASTY     2004  stent   EYE SURGERY     KNEE SURGERY  1974   right   LUMBAR LAMINECTOMY/DECOMPRESSION MICRODISCECTOMY N/A 04/13/2015   Procedure: L4-5 Decompression;  Surgeon: Marybelle Killings, MD;  Location: [facility];  Service: Orthopedics;  Laterality: N/A;  Need RNFA   Social History   Occupational History   Occupation: N/A  Tobacco Use   Smoking  status: Former    Packs/day: 0.50    Years: 5.00    Pack years: 2.50    Types: Cigarettes    Quit date: 02/19/1978    Years since quitting: 42.5   Smokeless tobacco: Never   Tobacco comments:    Quit 1982  Vaping Use   Vaping Use: Never used  Substance and Sexual Activity   Alcohol use: No    Alcohol/week: 0.0 standard drinks   Drug use: No   Sexual activity: Not on file

## 2020-09-02 ENCOUNTER — Telehealth: Payer: Self-pay

## 2020-09-02 NOTE — Telephone Encounter (Signed)
Patient would like a Rx for pain to be sent into her pharmacy.  Stated that she is having back pain on the left side. Per patient, Hydrocodone, makes her sick on the stomach.  Cb# [phone].  Please advise.  Thank you.

## 2020-09-06 ENCOUNTER — Telehealth: Payer: Self-pay

## 2020-09-06 NOTE — Telephone Encounter (Signed)
Tried calling patient to inform her of [place] message but, no answer, got a busy signal.

## 2020-09-06 NOTE — Telephone Encounter (Signed)
See previous message in patient's chart.

## 2020-09-07 DIAGNOSIS — E7800 Pure hypercholesterolemia, unspecified: Secondary | ICD-10-CM | POA: Diagnosis not present

## 2020-09-07 DIAGNOSIS — I5022 Chronic systolic (congestive) heart failure: Secondary | ICD-10-CM | POA: Diagnosis not present

## 2020-09-07 DIAGNOSIS — E1165 Type 2 diabetes mellitus with hyperglycemia: Secondary | ICD-10-CM | POA: Diagnosis not present

## 2020-09-07 DIAGNOSIS — I4891 Unspecified atrial fibrillation: Secondary | ICD-10-CM | POA: Diagnosis not present

## 2020-09-27 ENCOUNTER — Ambulatory Visit: Payer: Medicare HMO | Admitting: Podiatry

## 2020-09-29 DIAGNOSIS — E1165 Type 2 diabetes mellitus with hyperglycemia: Secondary | ICD-10-CM | POA: Diagnosis not present

## 2020-09-29 DIAGNOSIS — E7800 Pure hypercholesterolemia, unspecified: Secondary | ICD-10-CM | POA: Diagnosis not present

## 2020-09-29 DIAGNOSIS — I5022 Chronic systolic (congestive) heart failure: Secondary | ICD-10-CM | POA: Diagnosis not present

## 2020-09-29 DIAGNOSIS — I4891 Unspecified atrial fibrillation: Secondary | ICD-10-CM | POA: Diagnosis not present

## 2020-10-05 ENCOUNTER — Ambulatory Visit (INDEPENDENT_AMBULATORY_CARE_PROVIDER_SITE_OTHER): Payer: Medicare HMO | Admitting: Orthopaedic Surgery

## 2020-10-05 ENCOUNTER — Telehealth: Payer: Self-pay | Admitting: Radiology

## 2020-10-05 ENCOUNTER — Encounter: Payer: Self-pay | Admitting: Orthopaedic Surgery

## 2020-10-05 ENCOUNTER — Other Ambulatory Visit: Payer: Self-pay

## 2020-10-05 VITALS — BP 112/76 | Ht 64.0 in | Wt 176.0 lb

## 2020-10-05 DIAGNOSIS — Z9889 Other specified postprocedural states: Secondary | ICD-10-CM | POA: Diagnosis not present

## 2020-10-05 DIAGNOSIS — G8929 Other chronic pain: Secondary | ICD-10-CM | POA: Diagnosis not present

## 2020-10-05 DIAGNOSIS — M48062 Spinal stenosis, lumbar region with neurogenic claudication: Secondary | ICD-10-CM

## 2020-10-05 DIAGNOSIS — M545 Low back pain, unspecified: Secondary | ICD-10-CM | POA: Diagnosis not present

## 2020-10-05 MED ORDER — PREDNISONE 5 MG (21) PO TBPK: ORAL_TABLET | ORAL | 0 refills | Status: DC

## 2020-10-05 NOTE — Progress Notes (Signed)
Office Visit Note   Patient: Teresa Peck           Date of Birth: January 11, 1941           MRN: [id number] Visit Date: 10/05/2020              Requested by: Deland Pretty, MD [address] [place],  [place] [address] PCP: Deland Pretty, MD   Assessment & Plan: Visit Diagnoses:  1. Chronic left-sided low back pain, unspecified whether sciatica present   2. Lumbar stenosis with neurogenic claudication   3. History of lumbar laminectomy for spinal cord decompression     Plan: Patient states her pain is moderate to severe constant, unrelenting.  We will obtain an MRI scan lumbar without contrast.  Also follow-up after scan for review.  Follow-Up Instructions: No follow-ups on file.   Orders:  Orders Placed This Encounter  Procedures   MR Lumbar Spine w/o contrast   No orders of the defined types were placed in this encounter.     Procedures: No procedures performed   Clinical Data: No additional findings.   Subjective: Chief Complaint  Patient presents with   Lower Back - Pain, Follow-up   Left Hip - Pain, Follow-up    HPI 80 year old female returns with ongoing problems with back pain bilateral leg pain worse on the left than right.  Trochanteric injection 08/29/2020 helped some.  She continues to have back pain every day and she states it hurts all the time increased pain with bending turning.  She is used Tylenol without relief.  She is on chronic Eliquis.  She did not respond to prednisone Dosepak taper.  Previous L4-5 decompression 2017.  Recent plain radiographs demonstrate no anterolisthesis mild scoliosis centered at L3-4.  L2-3 level has narrowed and may be partially fused.  Aortoiliac atherosclerosis it was noted on plain radiograph.  Patient states she cannot stand long has trouble walking more than 200 feet she is ambulatory with a walker.  Review of Systems All other systems noncontributory to HPI.  Eliquis type 2 diabetes.  Note is  history of heart failure atrial fibrillation chronic anticoagulation with Eliquis.  Objective: Vital Signs: BP 112/76   Ht '5\' 4"'$  (1.626 m)   Wt 176 lb (79.8 kg)   BMI 30.21 kg/m   Physical Exam Constitutional:      Appearance: She is well-developed.  HENT:     Head: Normocephalic.     Right Ear: External ear normal.     Left Ear: External ear normal. There is no impacted cerumen.  Eyes:     Pupils: Pupils are equal, round, and reactive to light.  Neck:     Thyroid: No thyromegaly.     Trachea: No tracheal deviation.  Cardiovascular:     Rate and Rhythm: Normal rate.  Pulmonary:     Effort: Pulmonary effort is normal.  Abdominal:     Palpations: Abdomen is soft.  Musculoskeletal:     Cervical back: No rigidity.  Skin:    General: Skin is warm and dry.  Neurological:     Mental Status: She is alert and oriented to person, place, and time.  Psychiatric:        Behavior: Behavior normal.  Atrial fibrillation.  Ortho Exam well-healed lumbar incision.  Knee and ankle jerk are intact.  Negative logroll the hips.  FABER test negative.  Specialty Comments:  No specialty comments available.  Imaging: No results found.   PMFS History: Patient Active  Problem List   Diagnosis Date Noted   History of lumbar laminectomy for spinal cord decompression 10/06/2020   Acute lower UTI 09/07/2018   GERD (gastroesophageal reflux disease) 09/07/2018   Weakness generalized 09/07/2018   Pressure injury of skin 09/07/2018   Chronic atrial fibrillation ([facility]) 10/30/2016   Lumbar stenosis with neurogenic claudication [id number]   Chronic systolic CHF (congestive heart failure) ([facility]) 10/19/2014   Type 2 diabetes mellitus with complication, without long-term current use of insulin ([facility]) 11/08/2009   Hyperlipidemia 11/08/2009   CAD (coronary artery disease) 11/08/2009   CARDIOMYOPATHY, ISCHEMIC 11/08/2009   Past Medical History:  Diagnosis Date   Anemia    Arthritis    CAD (coronary  artery disease)    CAD (coronary artery disease)    Cancer (HCC)    skin cancer   CHF (congestive heart failure) (HCC)    Coronary artery disease    MI 2004-stent   Diabetes mellitus    Diabetic neuropathy (HCC)    GERD (gastroesophageal reflux disease)    tums   Gout    Hiatal hernia    Hyperlipidemia    Hypertension    PAD (peripheral artery disease) (HCC)    Tremor     Family History  Problem Relation Age of Onset   Heart attack Father    Heart attack Sister    Heart attack Brother    CAD Mother    Stroke Mother    Heart disease Mother    Cancer Mother     Past Surgical History:  Procedure Laterality Date   BILATERAL CORNEA IMPLANTS  2010   CARDIAC CATHETERIZATION     stent post MI    CHOLECYSTECTOMY     CORONARY ANGIOPLASTY     2004  stent   EYE SURGERY     KNEE SURGERY  1974   right   LUMBAR LAMINECTOMY/DECOMPRESSION MICRODISCECTOMY N/A 04/13/2015   Procedure: L4-5 Decompression;  Surgeon: Marybelle Killings, MD;  Location: [facility];  Service: Orthopedics;  Laterality: N/A;  Need RNFA   Social History   Occupational History   Occupation: N/A  Tobacco Use   Smoking status: Former    Packs/day: 0.50    Years: 5.00    Pack years: 2.50    Types: Cigarettes    Quit date: 02/19/1978    Years since quitting: 42.6   Smokeless tobacco: Never   Tobacco comments:    Quit 1982  Vaping Use   Vaping Use: Never used  Substance and Sexual Activity   Alcohol use: No    Alcohol/week: 0.0 standard drinks   Drug use: No   Sexual activity: Not on file

## 2020-10-05 NOTE — Telephone Encounter (Signed)
Patient called and states that she went to the pharmacy to get her prednisone but they do not have prescription.  Were you going to send in [place]?  Please advise.

## 2020-10-05 NOTE — Telephone Encounter (Signed)
Rx sent into pharm. Called patient.She is aware.

## 2020-10-06 DIAGNOSIS — Z9889 Other specified postprocedural states: Secondary | ICD-10-CM | POA: Insufficient documentation

## 2020-10-12 ENCOUNTER — Telehealth: Payer: Self-pay | Admitting: Orthopaedic Surgery

## 2020-10-12 ENCOUNTER — Other Ambulatory Visit: Payer: Self-pay | Admitting: Orthopaedic Surgery

## 2020-10-12 MED ORDER — HYDROCODONE-ACETAMINOPHEN 5-325 MG PO TABS: 1.0000 | ORAL_TABLET | Freq: Three times a day (TID) | ORAL | 0 refills | Status: AC | PRN

## 2020-10-12 NOTE — Telephone Encounter (Signed)
Pt is in pain and wondering if she can get something for the pain until she comes back in?

## 2020-10-12 NOTE — Telephone Encounter (Signed)
Please advise 

## 2020-10-13 NOTE — Telephone Encounter (Signed)
I called patient and advised. 

## 2020-10-19 ENCOUNTER — Telehealth: Payer: Self-pay

## 2020-10-19 NOTE — Telephone Encounter (Signed)
Contacted patient and explained to her that she would need to have MRI preformed before we can discuss results. Patient understood and had no further questions or concerns.

## 2020-10-19 NOTE — Telephone Encounter (Signed)
Pt called and would like a call back regarding her mri results.

## 2020-10-25 ENCOUNTER — Ambulatory Visit
Admission: RE | Admit: 2020-10-25 | Discharge: 2020-10-25 | Disposition: A | Discharge status: Home / Self Care | Payer: Medicare HMO | Source: Ambulatory Visit | Attending: Orthopaedic Surgery | Admitting: Orthopaedic Surgery

## 2020-10-25 ENCOUNTER — Other Ambulatory Visit: Payer: Self-pay

## 2020-10-25 DIAGNOSIS — M545 Low back pain, unspecified: Secondary | ICD-10-CM

## 2020-10-25 DIAGNOSIS — M48061 Spinal stenosis, lumbar region without neurogenic claudication: Secondary | ICD-10-CM | POA: Diagnosis not present

## 2020-10-25 DIAGNOSIS — G8929 Other chronic pain: Secondary | ICD-10-CM

## 2020-11-02 ENCOUNTER — Ambulatory Visit: Payer: Medicare HMO | Admitting: Physician Assistant

## 2020-11-15 ENCOUNTER — Ambulatory Visit: Payer: Medicare HMO | Admitting: Orthopaedic Surgery

## 2020-11-15 ENCOUNTER — Encounter: Payer: Self-pay | Admitting: Orthopaedic Surgery

## 2020-11-15 ENCOUNTER — Other Ambulatory Visit: Payer: Self-pay

## 2020-11-15 VITALS — BP 106/61 | HR 64 | Ht 64.0 in | Wt 176.0 lb

## 2020-11-15 DIAGNOSIS — M48062 Spinal stenosis, lumbar region with neurogenic claudication: Secondary | ICD-10-CM

## 2020-11-15 NOTE — Progress Notes (Signed)
Office Visit Note   Patient: Teresa Peck           Date of Birth: Mar 02, 1940           MRN: [id number] Visit Date: 11/15/2020              Requested by: Deland Pretty, MD [place],  [place] [id number] PCP: Deland Pretty, MD   Assessment & Plan: Visit Diagnoses:  1. Lumbar stenosis with neurogenic claudication     Plan: We discussed adjacent level stenosis at L3-4 above the area of previous surgery that was corrected in 2017.  She has a superior endplate fracture at L4 which is healing and she is got improvement and should continue to improve.  She continue to work on walking avoid heavy lifting.  Fall prevention discussed.  Return as needed.  Follow-Up Instructions: No follow-ups on file.   Orders:  No orders of the defined types were placed in this encounter.  No orders of the defined types were placed in this encounter.     Procedures: No procedures performed   Clinical Data: No additional findings.   Subjective: Chief Complaint  Patient presents with   Lower Back - Pain, Follow-up    MRI lumbar review    HPI 80 year old female returns she states over the last few weeks she is gotten significantly better.  MRI scan showed changes at the superior endplate of L4 suspicious for a subtle superior endplate fracture which is consistent with her fall.  Area of previous decompression 2017 at L4-5 looks good.  She is got some progressive spondylosis disc bulge with moderate to severe stenosis and lateral recess stenosis at L3-4.  Patient is moving better she is use some hydrocodone for pain she states Tylenol does not help.  Today we discussed his best avoid narcotic medication.  Patient states "100% better".  Review of Systems review updated unchanged.   Objective: Vital Signs: BP 106/61   Pulse 64   Ht 5\' 4"  (1.626 m)   Wt 176 lb (79.8 kg)   BMI 30.21 kg/m   Physical Exam Constitutional:      Appearance: She is well-developed.   HENT:     Head: Normocephalic.     Right Ear: External ear normal.     Left Ear: External ear normal. There is no impacted cerumen.  Eyes:     Pupils: Pupils are equal, round, and reactive to light.  Neck:     Thyroid: No thyromegaly.     Trachea: No tracheal deviation.  Cardiovascular:     Rate and Rhythm: Normal rate.  Pulmonary:     Effort: Pulmonary effort is normal.  Abdominal:     Palpations: Abdomen is soft.  Musculoskeletal:     Cervical back: No rigidity.  Skin:    General: Skin is warm and dry.  Neurological:     Mental Status: She is alert and oriented to person, place, and time.  Psychiatric:        Behavior: Behavior normal.    Ortho Exam significant improvement going from sitting to standing mobility and ambulation  Specialty Comments:  No specialty comments available.  Imaging: CLINICAL DATA:  Chronic low back pain, radiating down the left leg for the past 2 months. Prior L4-5 decompression.   EXAM: MRI LUMBAR SPINE WITHOUT CONTRAST   TECHNIQUE: Multiplanar, multisequence MR imaging of the lumbar spine was performed. No intravenous contrast was administered.   COMPARISON:  Multiple exams, including 08/05/2020  radiographs, prior MRI 02/15/2015   FINDINGS: Segmentation: The lowest lumbar type non-rib-bearing vertebra is labeled as L5.   Alignment:  3 mm degenerative retrolisthesis at the L5-S1 level.   Vertebrae: Disc desiccation is present throughout the lumbar spine with substantial loss of intervertebral disc height and multilevel degenerative endplate findings including type 2 degenerative endplate findings at the L4-5 and L5-S1 levels.   Band of low T1 signal along the superior endplate of L4 as shown on image 8 of series 7 associated with a small Schmorl's node type concavity, this appearance could reflect an acute Schmorl's node or a subtle superior endplate compression fracture but without substantial loss of vertebral height. Images  6-9 of series 7 and image 8 of series 8 are representative. There also type 1 degenerative endplate findings at the L3-4 level. There is only minimal signal within the intervertebral disc hence the appearance is not considered suspicious for discitis.   Conus medullaris and cauda equina: Conus extends to the T12-L1 level. Conus and cauda equina appear normal.   Paraspinal and other soft tissues: Notable scarring in the right kidney lower pole.   Disc levels:   T12-L1: No impingement.  Disc osteophyte complex.   L1-2: No impingement. Disc osteophyte complex with mild right paracentral eccentricity. Similar to prior.   L2-3: No impingement.  Posterior osseous ridging.   L3-4: Prominent central narrowing of the thecal sac with mild left foraminal stenosis and moderate bilateral subarticular lateral recess stenosis due to intervertebral spurring, right paracentral disc protrusion, facet arthropathy, ligamentum flavum redundancy. The subarticular lateral recess component of the impingement at this level appears mildly increased from prior.   L4-5: Mild bilateral foraminal stenosis and mild bilateral subarticular lateral recess stenosis along with mild central narrowing of the thecal sac due to intervertebral spurring and diffuse disc bulge. The central narrowing of the thecal sac and subarticular lateral recess stenosis shown 02/15/2015 is substantially improved status post interval posterior decompression.   L5-S1: No overt impingement. Posterior osseous ridging and mild disc bulge.   IMPRESSION: 1. Lumbar spondylosis and degenerative disc disease with prominent impingement at L3-4 and mild impingement at L4-5, as detailed above. 2. There is a new band of low T1 signal along the superior endplate at L4 suspicious for a subtle superior endplate fracture although without substantial loss of vertebral height. An acute Schmorl's node could have a similar appearance. Radiographic  surveillance may be warranted to preclude progression. 3. Chronic scarring in the right kidney lower pole.     Electronically Signed   By: Van Clines M.D.   On: 10/26/2020 14:31   PMFS History: Patient Active Problem List   Diagnosis Date Noted   History of lumbar laminectomy for spinal cord decompression 10/06/2020   Acute lower UTI 09/07/2018   GERD (gastroesophageal reflux disease) 09/07/2018   Weakness generalized 09/07/2018   Pressure injury of skin 09/07/2018   Chronic atrial fibrillation ([facility]) 10/30/2016   Lumbar stenosis with neurogenic claudication 17/40/8144   Chronic systolic CHF (congestive heart failure) ([facility]) 10/19/2014   Type 2 diabetes mellitus with complication, without long-term current use of insulin ([facility]) 11/08/2009   Hyperlipidemia 11/08/2009   CAD (coronary artery disease) 11/08/2009   CARDIOMYOPATHY, ISCHEMIC 11/08/2009   Past Medical History:  Diagnosis Date   Anemia    Arthritis    CAD (coronary artery disease)    CAD (coronary artery disease)    Cancer (HCC)    skin cancer   CHF (congestive heart failure) ([facility])  Coronary artery disease    MI 2004-stent   Diabetes mellitus    Diabetic neuropathy (HCC)    GERD (gastroesophageal reflux disease)    tums   Gout    Hiatal hernia    Hyperlipidemia    Hypertension    PAD (peripheral artery disease) (HCC)    Tremor     Family History  Problem Relation Age of Onset   Heart attack Father    Heart attack Sister    Heart attack Brother    CAD Mother    Stroke Mother    Heart disease Mother    Cancer Mother     Past Surgical History:  Procedure Laterality Date   BILATERAL CORNEA IMPLANTS  2010   CARDIAC CATHETERIZATION     stent post MI    CHOLECYSTECTOMY     CORONARY ANGIOPLASTY     2004  stent   EYE SURGERY     KNEE SURGERY  1974   right   LUMBAR LAMINECTOMY/DECOMPRESSION MICRODISCECTOMY N/A 04/13/2015   Procedure: L4-5 Decompression;  Surgeon: Marybelle Killings, MD;  Location:  [facility];  Service: Orthopedics;  Laterality: N/A;  Need RNFA   Social History   Occupational History   Occupation: N/A  Tobacco Use   Smoking status: Former    Packs/day: 0.50    Years: 5.00    Pack years: 2.50    Types: Cigarettes    Quit date: 02/19/1978    Years since quitting: 42.7   Smokeless tobacco: Never   Tobacco comments:    Quit 1982  Vaping Use   Vaping Use: Never used  Substance and Sexual Activity   Alcohol use: No    Alcohol/week: 0.0 standard drinks   Drug use: No   Sexual activity: Not on file

## 2020-12-08 DIAGNOSIS — E7800 Pure hypercholesterolemia, unspecified: Secondary | ICD-10-CM | POA: Diagnosis not present

## 2020-12-08 DIAGNOSIS — Z23 Encounter for immunization: Secondary | ICD-10-CM | POA: Diagnosis not present

## 2020-12-08 DIAGNOSIS — I4891 Unspecified atrial fibrillation: Secondary | ICD-10-CM | POA: Diagnosis not present

## 2020-12-08 DIAGNOSIS — I5022 Chronic systolic (congestive) heart failure: Secondary | ICD-10-CM | POA: Diagnosis not present

## 2020-12-08 DIAGNOSIS — E1165 Type 2 diabetes mellitus with hyperglycemia: Secondary | ICD-10-CM | POA: Diagnosis not present

## 2020-12-13 DIAGNOSIS — L218 Other seborrheic dermatitis: Secondary | ICD-10-CM | POA: Diagnosis not present

## 2020-12-13 DIAGNOSIS — Z0000 Encounter for general adult medical examination without abnormal findings: Secondary | ICD-10-CM | POA: Diagnosis not present

## 2020-12-13 DIAGNOSIS — E7800 Pure hypercholesterolemia, unspecified: Secondary | ICD-10-CM | POA: Diagnosis not present

## 2020-12-13 DIAGNOSIS — E1165 Type 2 diabetes mellitus with hyperglycemia: Secondary | ICD-10-CM | POA: Diagnosis not present

## 2020-12-13 DIAGNOSIS — C44319 Basal cell carcinoma of skin of other parts of face: Secondary | ICD-10-CM | POA: Diagnosis not present

## 2020-12-13 DIAGNOSIS — X32XXXA Exposure to sunlight, initial encounter: Secondary | ICD-10-CM | POA: Diagnosis not present

## 2020-12-13 DIAGNOSIS — L820 Inflamed seborrheic keratosis: Secondary | ICD-10-CM | POA: Diagnosis not present

## 2020-12-13 DIAGNOSIS — L570 Actinic keratosis: Secondary | ICD-10-CM | POA: Diagnosis not present

## 2020-12-14 DIAGNOSIS — E7800 Pure hypercholesterolemia, unspecified: Secondary | ICD-10-CM | POA: Diagnosis not present

## 2020-12-14 DIAGNOSIS — I2510 Atherosclerotic heart disease of native coronary artery without angina pectoris: Secondary | ICD-10-CM | POA: Diagnosis not present

## 2020-12-14 DIAGNOSIS — N1831 Chronic kidney disease, stage 3a: Secondary | ICD-10-CM | POA: Diagnosis not present

## 2020-12-14 DIAGNOSIS — E1165 Type 2 diabetes mellitus with hyperglycemia: Secondary | ICD-10-CM | POA: Diagnosis not present

## 2020-12-14 DIAGNOSIS — Z0000 Encounter for general adult medical examination without abnormal findings: Secondary | ICD-10-CM | POA: Diagnosis not present

## 2020-12-28 DIAGNOSIS — N1832 Chronic kidney disease, stage 3b: Secondary | ICD-10-CM | POA: Diagnosis not present

## 2021-01-02 DIAGNOSIS — N1832 Chronic kidney disease, stage 3b: Secondary | ICD-10-CM | POA: Diagnosis not present

## 2021-01-02 DIAGNOSIS — I129 Hypertensive chronic kidney disease with stage 1 through stage 4 chronic kidney disease, or unspecified chronic kidney disease: Secondary | ICD-10-CM | POA: Diagnosis not present

## 2021-01-02 DIAGNOSIS — R251 Tremor, unspecified: Secondary | ICD-10-CM | POA: Diagnosis not present

## 2021-01-02 DIAGNOSIS — I5022 Chronic systolic (congestive) heart failure: Secondary | ICD-10-CM | POA: Diagnosis not present

## 2021-01-02 DIAGNOSIS — E1122 Type 2 diabetes mellitus with diabetic chronic kidney disease: Secondary | ICD-10-CM | POA: Diagnosis not present

## 2021-01-02 DIAGNOSIS — R809 Proteinuria, unspecified: Secondary | ICD-10-CM | POA: Diagnosis not present

## 2021-01-02 DIAGNOSIS — I4891 Unspecified atrial fibrillation: Secondary | ICD-10-CM | POA: Diagnosis not present

## 2021-01-02 DIAGNOSIS — E1129 Type 2 diabetes mellitus with other diabetic kidney complication: Secondary | ICD-10-CM | POA: Diagnosis not present

## 2021-01-10 ENCOUNTER — Ambulatory Visit: Payer: Medicare HMO | Admitting: Cardiovascular Disease

## 2021-01-10 ENCOUNTER — Encounter: Payer: Self-pay | Admitting: Cardiovascular Disease

## 2021-01-10 ENCOUNTER — Other Ambulatory Visit: Payer: Self-pay

## 2021-01-10 VITALS — BP 124/72 | HR 84 | Ht 64.0 in | Wt 178.2 lb

## 2021-01-10 DIAGNOSIS — I482 Chronic atrial fibrillation, unspecified: Secondary | ICD-10-CM | POA: Diagnosis not present

## 2021-01-10 DIAGNOSIS — I2510 Atherosclerotic heart disease of native coronary artery without angina pectoris: Secondary | ICD-10-CM

## 2021-01-10 NOTE — Patient Instructions (Signed)
Medication Instructions:  Your physician recommends that you continue on your current medications as directed. Please refer to the Current Medication list given to you today.  *If you need a refill on your cardiac medications before your next appointment, please call your pharmacy*   Lab Work: None If you have labs (blood work) drawn today and your tests are completely normal, you will receive your results only by: [place] (if you have MyChart) OR A paper copy in the mail If you have any lab test that is abnormal or we need to change your treatment, we will call you to review the results.   Testing/Procedures: None   Follow-Up: At [facility], you and your health needs are our priority.  As part of our continuing mission to provide you with exceptional heart care, we have created designated Provider Care Teams.  These Care Teams include your primary Cardiologist (physician) and Advanced Practice Providers (APPs -  Physician Assistants and Nurse Practitioners) who all work together to provide you with the care you need, when you need it.  We recommend signing up for the patient portal called "MyChart".  Sign up information is provided on this After Visit Summary.  MyChart is used to connect with patients for Virtual Visits (Telemedicine).  Patients are able to view lab/test results, encounter notes, upcoming appointments, etc.  Non-urgent messages can be sent to your provider as well.   To learn more about what you can do with MyChart, go to [email or web address].    Your next appointment:   1 year(s)  The format for your next appointment:   In Person  Provider:   Christen Bame, NP or Richardson Dopp, PA-C}    Other Instructions

## 2021-01-10 NOTE — Progress Notes (Signed)
Cardiology Office Note   Date:  01/10/2021   ID:  Teresa Peck 01-Sep-1940, MRN [id number]  PCP:  Deland Pretty, MD  Cardiologist:   Mertie Moores, MD   Chief Complaint  Patient presents with   Congestive Heart Failure        Atrial Fibrillation        Problem list: 1. Coronary artery disease: She has an occluded LAD. She status post stenting of her first diagonal 2. Hyperlipidemia 3. Chronic systolic congestive heart failure 4. Essential hypertension 5. 5. Diabetes mellitus - peripheral neuropathy    Teresa Peck is a 80 y.o. female who presents for follow-up of her coronary artery disease. She's been seen in the past by Dr. Rockey Situ. She was seen with her Daughter, [name].  She had a cath in 2004 and had stenting of her 1st diag. Dr. Minna Antis has stopped some of her meds ( Coreg) also stopped allopurinol and Digoxin.   She denies any cp.  Breathing is ok.   Eats lots of Progresso soup.  She does not exercise.   Knows that she needs to walk more.  Has claudication with ambulation .   Aug. 30, 2016:   Doing well.  Complains of leg pain - was told that she had diabetic neuropathy Lots of leg swelling ,  Still eating lots of soup and canned foods.   Dec. 2 , 2016:  Breathing is good.  Leg Swelling is better  She thinks the Actos is causing fluid retention and weight gain .   May 17, 2015:  Has had her back surgery  ,.   Legs and back feel much better.  She was hypotensive prior to her back surgery and we held her coreg and Quinipril   Sept. 19, 2017:  [name] is seen back for a follow up visit. Has cut out her salt. Has lots 8 lbs.  Breathing is ok   Cannot tell that her HR is irregular.   Sept. 11, 2018:  Seen today for follow  Breathing is going well.   June 04, 2017:   Teresa Peck is seen today for follow-up of her coronary artery disease, chronic systolic congestive heart failure, atrial fibrillation.  She also has a history of  hyperlipidemia.  Has bilateral leg pain when she walks   February 26, 2018  Seen for follow  Up  Still has frequent episodes of leg pain, she admits that she does not walk enough. Her breathing seems to be okay.  She is not having any episodes of chest pain.  Nov. 6, 2020   Doing well. Still in Afib  Had a serious fall in July,  ? Syncope  Was in the hospital off and on through most of the month of July  Doing well , no syncope   August 10, 2019: Teresa Peck is seen today for follow-up of her congestive heart failure, atrial fibrillation, coronary artery disease. Seen with daughter, Jenny Reichmann .  Has developed a tremor.   Cannot find a solution to her tremor .  No CP or dyspnea   Feb. 18, 2022: Teresa Peck is seen today for follow up of her CHF, Afib, CAD . No cp, no dyspnea.    Complains of bilateral calf pain .  She was concerned about PAD. Screening ABIs in 2019 were normal .  Bp is borderline low today  Drinks water but does not eat much protein   January 10, 2021: Teresa Peck is seen today for follow-up of her  atrial fibrillation, congestive heart failure, coronary artery disease. Not feeling very well - no cardiac complaints  HR and BP are well controlled.  She brought her container of "total Beets" and asked if it was safe. It seems to be safe.   Past Medical History:  Diagnosis Date   Anemia    Arthritis    CAD (coronary artery disease)    CAD (coronary artery disease)    Cancer (HCC)    skin cancer   CHF (congestive heart failure) (HCC)    Coronary artery disease    MI 2004-stent   Diabetes mellitus    Diabetic neuropathy (HCC)    GERD (gastroesophageal reflux disease)    tums   Gout    Hiatal hernia    Hyperlipidemia    Hypertension    PAD (peripheral artery disease) ([place])    Tremor     Past Surgical History:  Procedure Laterality Date   BILATERAL CORNEA IMPLANTS  2010   CARDIAC CATHETERIZATION     stent post MI    CHOLECYSTECTOMY     CORONARY ANGIOPLASTY      2004  stent   EYE SURGERY     KNEE SURGERY  1974   right   LUMBAR LAMINECTOMY/DECOMPRESSION MICRODISCECTOMY N/A 04/13/2015   Procedure: L4-5 Decompression;  Surgeon: Marybelle Killings, MD;  Location: [facility];  Service: Orthopedics;  Laterality: N/A;  Need RNFA     Current Outpatient Medications  Medication Sig Dispense Refill   acetaminophen (TYLENOL) 325 MG tablet Take 2 tablets (650 mg total) by mouth every 6 (six) hours as needed for mild pain or headache.     allopurinol (ZYLOPRIM) 300 MG tablet Take 300 mg by mouth daily.     apixaban (ELIQUIS) 2.5 MG TABS tablet Take 1 tablet (2.5 mg total) by mouth 2 (two) times daily. 180 tablet 3   Biotin 2500 MCG CAPS Take 2,500 mcg by mouth.     Calcium Carb-Cholecalciferol (CALCIUM + D3) 600-800 MG-UNIT TABS Take 1 tablet by mouth daily.     Cyanocobalamin (VITAMIN B12) 1000 MCG TBCR Take 1 tablet by mouth daily.     ezetimibe (ZETIA) 10 MG tablet Take 1 tablet by mouth daily.     furosemide (LASIX) 40 MG tablet Take 20 mg by mouth 2 (two) times a week.     gabapentin (NEURONTIN) 600 MG tablet Take 600 mg by mouth 2 (two) times daily.       insulin glargine (LANTUS SOLOSTAR) 100 UNIT/ML Solostar Pen Inject 9 Units into the skin daily.     Magnesium Oxide 400 (240 Mg) MG TABS Take 1 tablet (400 mg total) by mouth daily. 30 tablet    metoprolol succinate (TOPROL-XL) 25 MG 24 hr tablet Take 25 mg by mouth daily.     Multiple Vitamins-Minerals (CENTRUM SILVER 50+WOMEN) TABS Take 1 tablet by mouth daily.     nitroGLYCERIN (NITROSTAT) 0.4 MG SL tablet PLACE 1 TABLET UNDER THE TONGUE EVERY 5 MINUTES AS NEEDED FOR  CHEST  PAIN 25 tablet 7   senna-docusate (SENOKOT-S) 8.6-50 MG tablet Take 1 tablet by mouth 2 (two) times daily.     UNABLE TO FIND Med Name: pt reports she is using a different insulin pen but does not remember the name of the insulin.     UNABLE TO FIND Med Name: reports taking otc stool softener as needed     HYDROcodone-acetaminophen  (NORCO/VICODIN) 5-325 MG tablet Take 1 tablet by mouth every 8 (eight) hours as  needed for moderate pain. (Patient not taking: Reported on 10/05/2020) 15 tablet 0   HYDROcodone-acetaminophen (NORCO/VICODIN) 5-325 MG tablet Take 1 tablet by mouth every 8 (eight) hours as needed for moderate pain. (Patient not taking: Reported on 01/10/2021) 20 tablet 0   predniSONE (STERAPRED UNI-PAK 21 TAB) 10 MG (21) TBPK tablet Take by mouth daily. Take 6 tabs by mouth on first day then decrease by 1 tablet each day until finished on day 6. (Patient not taking: Reported on 10/05/2020) 21 tablet 0   predniSONE (STERAPRED UNI-PAK 21 TAB) 5 MG (21) TBPK tablet TAKE AS DIRECTED (Patient not taking: Reported on 11/15/2020) 21 tablet 0   rosuvastatin (CRESTOR) 10 MG tablet Take 10 mg by mouth daily. (Patient not taking: Reported on 01/10/2021)     TRUE METRIX BLOOD GLUCOSE TEST test strip 1 each by Other route as needed. (Patient not taking: Reported on 01/10/2021)     No current facility-administered medications for this visit.    Allergies:   Mirtazapine, Sulfamethoxazole-trimethoprim, and Elemental sulfur    Social History:  The patient  reports that she quit smoking about 42 years ago. Her smoking use included cigarettes. She has a 2.50 pack-year smoking history. She has never used smokeless tobacco. She reports that she does not drink alcohol and does not use drugs.   Family History:  The patient's family history includes CAD in her mother; Cancer in her mother; Heart attack in her brother, father, and sister; Heart disease in her mother; Stroke in her mother.    ROS: Noted in current history, otherwise review of systems is negative.  Physical Exam: Blood pressure 124/72, pulse 84, height 5\' 4"  (1.626 m), weight 178 lb 3.2 oz (80.8 kg), SpO2 99 %.  GEN:  elderly , frail female,   HEENT: Normal NECK: No JVD; No carotid bruits LYMPHATICS: No lymphadenopathy CARDIAC:  irreg. Irreg.  RESPIRATORY:  Clear to  auscultation without rales, wheezing or rhonchi  ABDOMEN: Soft, non-tender, non-distended MUSCULOSKELETAL:  No edema; No deformity  SKIN: Warm and dry NEUROLOGIC:  Alert and oriented x 3   EKG:          Recent Labs: No results found for requested labs within last 8760 hours.    Lipid Panel    Component Value Date/Time   CHOL 115 02/26/2018 1631   TRIG 112 02/26/2018 1631   HDL 52 02/26/2018 1631   CHOLHDL 2.2 02/26/2018 1631   LDLCALC 41 02/26/2018 1631      Wt Readings from Last 3 Encounters:  01/10/21 178 lb 3.2 oz (80.8 kg)  11/15/20 176 lb (79.8 kg)  10/05/20 176 lb (79.8 kg)    Other studies Reviewed: Additional studies/ records that were reviewed today include: . Review of the above records demonstrates:   ASSESSMENT AND PLAN:  1. Coronary artery disease:   no angina   2. Atrial Fibrillation :   CHADS2VASC is  92  ( female, age 16, DM,  HTN)     She is on eliquis 2.5 bid   3. Chronic systolic congestive heart failure:  symptoms seem to be stable.   4. Essential hypertension -.  BP is well controlled.   5. Diabetes mellitus -   6. Leg pain :   ABI screening was normal in 2019.     Current medicines are reviewed at length with the patient today.  The patient does not have concerns regarding medicines.    Disposition:  to see an APP in 1 year .  Mertie Moores, MD  01/10/2021 4:33 PM    [facility] [address], [address], [place]  [address] Phone: [phone]; Fax: [phone]

## 2021-01-24 DIAGNOSIS — Z08 Encounter for follow-up examination after completed treatment for malignant neoplasm: Secondary | ICD-10-CM | POA: Diagnosis not present

## 2021-01-24 DIAGNOSIS — Z85828 Personal history of other malignant neoplasm of skin: Secondary | ICD-10-CM | POA: Diagnosis not present

## 2021-03-02 DIAGNOSIS — E1165 Type 2 diabetes mellitus with hyperglycemia: Secondary | ICD-10-CM | POA: Diagnosis not present

## 2021-03-02 DIAGNOSIS — E7800 Pure hypercholesterolemia, unspecified: Secondary | ICD-10-CM | POA: Diagnosis not present

## 2021-03-02 DIAGNOSIS — Z794 Long term (current) use of insulin: Secondary | ICD-10-CM | POA: Diagnosis not present

## 2021-03-02 DIAGNOSIS — I5022 Chronic systolic (congestive) heart failure: Secondary | ICD-10-CM | POA: Diagnosis not present

## 2021-03-02 DIAGNOSIS — R630 Anorexia: Secondary | ICD-10-CM | POA: Diagnosis not present

## 2021-03-03 DIAGNOSIS — L02821 Furuncle of head [any part, except face]: Secondary | ICD-10-CM | POA: Diagnosis not present

## 2021-03-03 DIAGNOSIS — B9689 Other specified bacterial agents as the cause of diseases classified elsewhere: Secondary | ICD-10-CM | POA: Diagnosis not present

## 2021-03-30 DIAGNOSIS — N183 Chronic kidney disease, stage 3 unspecified: Secondary | ICD-10-CM | POA: Diagnosis not present

## 2021-03-30 DIAGNOSIS — I4891 Unspecified atrial fibrillation: Secondary | ICD-10-CM | POA: Diagnosis not present

## 2021-03-30 DIAGNOSIS — I10 Essential (primary) hypertension: Secondary | ICD-10-CM | POA: Diagnosis not present

## 2021-03-30 DIAGNOSIS — E1121 Type 2 diabetes mellitus with diabetic nephropathy: Secondary | ICD-10-CM | POA: Diagnosis not present

## 2021-04-20 ENCOUNTER — Other Ambulatory Visit: Payer: Self-pay

## 2021-04-20 ENCOUNTER — Encounter: Payer: Self-pay | Admitting: Podiatry

## 2021-04-20 ENCOUNTER — Ambulatory Visit: Payer: Medicare HMO | Admitting: Podiatry

## 2021-04-20 DIAGNOSIS — M79675 Pain in left toe(s): Secondary | ICD-10-CM

## 2021-04-20 DIAGNOSIS — M79674 Pain in right toe(s): Secondary | ICD-10-CM

## 2021-04-20 DIAGNOSIS — B351 Tinea unguium: Secondary | ICD-10-CM | POA: Diagnosis not present

## 2021-04-20 DIAGNOSIS — I509 Heart failure, unspecified: Secondary | ICD-10-CM | POA: Diagnosis not present

## 2021-04-21 NOTE — Progress Notes (Signed)
Subjective:  ? ?Patient ID: Teresa Peck, female   DOB: 81 y.o.   MRN: [id number]  ? ?HPI ?Patient presents concerned about the right foot turning towards the left foot and whether or not there is pathology with this and if anything can be done in nail disease 1-5 both feet that are thick incurvated and painful for the patient ? ? ?ROS ? ? ?   ?Objective:  ?Physical Exam  ?I did not note significant change in the right foot there is mild edema in her feet overall but localized in thick yellow brittle nailbeds 1-5 both feet that are painful and she cannot cut ? ?   ?Assessment:  ?Possible turning of the right foot in a mild direction but its not symptomatic currently and she has no issues of instability with mycotic nail infection symptomatic 1-5 both feet ? ?   ?Plan:  ?Do not recommend treatment for any kind of issue associated with turning of the right foot and less she should develop instability and debrided nailbeds 1-5 both feet no iatrogenic bleeding noted ?   ? ? ?

## 2021-06-22 DIAGNOSIS — E7800 Pure hypercholesterolemia, unspecified: Secondary | ICD-10-CM | POA: Diagnosis not present

## 2021-06-22 DIAGNOSIS — E1165 Type 2 diabetes mellitus with hyperglycemia: Secondary | ICD-10-CM | POA: Diagnosis not present

## 2021-06-22 DIAGNOSIS — E1121 Type 2 diabetes mellitus with diabetic nephropathy: Secondary | ICD-10-CM | POA: Diagnosis not present

## 2021-06-29 DIAGNOSIS — I509 Heart failure, unspecified: Secondary | ICD-10-CM | POA: Diagnosis not present

## 2021-06-29 DIAGNOSIS — N1831 Chronic kidney disease, stage 3a: Secondary | ICD-10-CM | POA: Diagnosis not present

## 2021-06-29 DIAGNOSIS — I4891 Unspecified atrial fibrillation: Secondary | ICD-10-CM | POA: Diagnosis not present

## 2021-06-29 DIAGNOSIS — E7800 Pure hypercholesterolemia, unspecified: Secondary | ICD-10-CM | POA: Diagnosis not present

## 2021-06-29 DIAGNOSIS — E1122 Type 2 diabetes mellitus with diabetic chronic kidney disease: Secondary | ICD-10-CM | POA: Diagnosis not present

## 2021-06-29 DIAGNOSIS — I10 Essential (primary) hypertension: Secondary | ICD-10-CM | POA: Diagnosis not present

## 2021-06-29 DIAGNOSIS — I130 Hypertensive heart and chronic kidney disease with heart failure and stage 1 through stage 4 chronic kidney disease, or unspecified chronic kidney disease: Secondary | ICD-10-CM | POA: Diagnosis not present

## 2021-06-29 DIAGNOSIS — E1121 Type 2 diabetes mellitus with diabetic nephropathy: Secondary | ICD-10-CM | POA: Diagnosis not present

## 2021-08-18 DIAGNOSIS — L02821 Furuncle of head [any part, except face]: Secondary | ICD-10-CM | POA: Diagnosis not present

## 2021-08-18 DIAGNOSIS — L218 Other seborrheic dermatitis: Secondary | ICD-10-CM | POA: Diagnosis not present

## 2021-08-18 DIAGNOSIS — B9689 Other specified bacterial agents as the cause of diseases classified elsewhere: Secondary | ICD-10-CM | POA: Diagnosis not present

## 2021-08-24 DIAGNOSIS — Z79899 Other long term (current) drug therapy: Secondary | ICD-10-CM | POA: Diagnosis not present

## 2021-08-28 DIAGNOSIS — N1831 Chronic kidney disease, stage 3a: Secondary | ICD-10-CM | POA: Diagnosis not present

## 2021-08-28 DIAGNOSIS — L739 Follicular disorder, unspecified: Secondary | ICD-10-CM | POA: Diagnosis not present

## 2021-08-28 DIAGNOSIS — I10 Essential (primary) hypertension: Secondary | ICD-10-CM | POA: Diagnosis not present

## 2021-08-28 DIAGNOSIS — E1121 Type 2 diabetes mellitus with diabetic nephropathy: Secondary | ICD-10-CM | POA: Diagnosis not present

## 2021-09-19 ENCOUNTER — Other Ambulatory Visit: Payer: Self-pay

## 2021-09-19 NOTE — Patient Outreach (Signed)
[facility]) Care Management  09/19/2021  Teresa Peck Jan 20, 1941 [id number]    Telephone call to patient for nurse call.  No answer. Unable to leave a message.  Plan: RN CM will attempt again within 4 business days and send letter.  Jone Baseman, RN, MSN [facility] Care Management Care Management Coordinator Direct Line [phone] Toll Free: [phone]  Fax: [phone]

## 2021-09-20 ENCOUNTER — Other Ambulatory Visit: Payer: Self-pay

## 2021-09-20 NOTE — Patient Outreach (Signed)
[facility]) Care Management  09/20/2021  Teresa Peck 30-May-1940 [id number]   Telephone call to patient for nurse call.  No answer. Unable to leave a message.  Plan: RN CM will attempt again within 4 business days.  Jone Baseman, RN, MSN [facility] Care Management Care Management Coordinator Direct Line [phone] Toll Free: [phone]  Fax: [phone]

## 2021-09-22 ENCOUNTER — Other Ambulatory Visit: Payer: Self-pay

## 2021-09-22 NOTE — Patient Outreach (Signed)
[facility] [facility]) Care Management  09/22/2021  Teresa Peck [birth year] [id number]   Telephone call to patient for nurse call.  No answer. Unable to leave a message.  Plan: RN CM will attempt again within 3 weeks.  Jone Baseman, RN, MSN [facility] Care Management Care Management Coordinator Direct Line [phone] Toll Free: [phone]  Fax: [phone]

## 2021-09-26 DIAGNOSIS — D582 Other hemoglobinopathies: Secondary | ICD-10-CM | POA: Diagnosis not present

## 2021-09-26 DIAGNOSIS — E7800 Pure hypercholesterolemia, unspecified: Secondary | ICD-10-CM | POA: Diagnosis not present

## 2021-09-26 DIAGNOSIS — N1831 Chronic kidney disease, stage 3a: Secondary | ICD-10-CM | POA: Diagnosis not present

## 2021-09-26 DIAGNOSIS — Z79899 Other long term (current) drug therapy: Secondary | ICD-10-CM | POA: Diagnosis not present

## 2021-09-26 DIAGNOSIS — E1121 Type 2 diabetes mellitus with diabetic nephropathy: Secondary | ICD-10-CM | POA: Diagnosis not present

## 2021-10-02 DIAGNOSIS — E1121 Type 2 diabetes mellitus with diabetic nephropathy: Secondary | ICD-10-CM | POA: Diagnosis not present

## 2021-10-02 DIAGNOSIS — N1831 Chronic kidney disease, stage 3a: Secondary | ICD-10-CM | POA: Diagnosis not present

## 2021-10-02 DIAGNOSIS — I10 Essential (primary) hypertension: Secondary | ICD-10-CM | POA: Diagnosis not present

## 2021-10-10 ENCOUNTER — Other Ambulatory Visit: Payer: Self-pay

## 2021-10-10 NOTE — Patient Outreach (Signed)
[facility]) Care Management  10/10/2021  Teresa Peck [birth year] [id number]   Telephone call to patient for nurse call.  No answer. Unable to leave a message.  Plan: RN CM will Close Case.   Jone Baseman, RN, MSN [facility] Care Management Care Management Coordinator Direct Line [phone] Toll Free: [phone]  Fax: [phone]

## 2021-11-01 DIAGNOSIS — N1831 Chronic kidney disease, stage 3a: Secondary | ICD-10-CM | POA: Diagnosis not present

## 2021-11-01 DIAGNOSIS — E1121 Type 2 diabetes mellitus with diabetic nephropathy: Secondary | ICD-10-CM | POA: Diagnosis not present

## 2021-11-01 DIAGNOSIS — I10 Essential (primary) hypertension: Secondary | ICD-10-CM | POA: Diagnosis not present

## 2021-12-13 DIAGNOSIS — I10 Essential (primary) hypertension: Secondary | ICD-10-CM | POA: Diagnosis not present

## 2021-12-13 DIAGNOSIS — I4891 Unspecified atrial fibrillation: Secondary | ICD-10-CM | POA: Diagnosis not present

## 2021-12-13 DIAGNOSIS — E7800 Pure hypercholesterolemia, unspecified: Secondary | ICD-10-CM | POA: Diagnosis not present

## 2021-12-13 DIAGNOSIS — E1121 Type 2 diabetes mellitus with diabetic nephropathy: Secondary | ICD-10-CM | POA: Diagnosis not present

## 2022-01-03 DIAGNOSIS — E7800 Pure hypercholesterolemia, unspecified: Secondary | ICD-10-CM | POA: Diagnosis not present

## 2022-01-03 DIAGNOSIS — E1121 Type 2 diabetes mellitus with diabetic nephropathy: Secondary | ICD-10-CM | POA: Diagnosis not present

## 2022-01-23 ENCOUNTER — Other Ambulatory Visit: Payer: Self-pay | Admitting: Cardiovascular Disease

## 2022-01-23 NOTE — Telephone Encounter (Signed)
Rx refill sent to pharmacy. 

## 2022-02-01 DIAGNOSIS — Z794 Long term (current) use of insulin: Secondary | ICD-10-CM | POA: Diagnosis not present

## 2022-02-01 DIAGNOSIS — E1121 Type 2 diabetes mellitus with diabetic nephropathy: Secondary | ICD-10-CM | POA: Diagnosis not present

## 2022-02-01 DIAGNOSIS — N1831 Chronic kidney disease, stage 3a: Secondary | ICD-10-CM | POA: Diagnosis not present

## 2022-02-01 DIAGNOSIS — I10 Essential (primary) hypertension: Secondary | ICD-10-CM | POA: Diagnosis not present

## 2022-02-01 DIAGNOSIS — Z23 Encounter for immunization: Secondary | ICD-10-CM | POA: Diagnosis not present

## 2022-02-06 ENCOUNTER — Encounter: Payer: Self-pay | Admitting: Cardiovascular Disease

## 2022-02-06 ENCOUNTER — Ambulatory Visit: Payer: Medicare HMO | Attending: Cardiovascular Disease | Admitting: Cardiovascular Disease

## 2022-02-06 VITALS — BP 118/64 | HR 109 | Ht 64.0 in | Wt 174.6 lb

## 2022-02-06 DIAGNOSIS — I739 Peripheral vascular disease, unspecified: Secondary | ICD-10-CM | POA: Diagnosis not present

## 2022-02-06 NOTE — Assessment & Plan Note (Signed)
Ms. Shill was referred by Dr. Shelia Media  for evaluation of PAD.  She has a history of treated hypertension, hyperlipidemia and diabetes.  She has had CAD in the past as well.  She is complaining of right and left lower extremity discomfort which is somewhat atypical although there are some claudication symptoms.  She did have ABIs performed/29/19 which were essentially normal.  We will recheck lower extremity arterial Doppler studies.

## 2022-02-06 NOTE — Progress Notes (Signed)
02/06/2022 [place]   [birth year]  [id number]  Primary Physician Deland Pretty, MD Primary Cardiologist: Lorretta Harp MD Teresa Peck, [place]  HPI:  Teresa Peck is a 81 y.o. 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  She was referred by Dr. Shelia Media, her PCP, for evaluation of PAD.  She does have a history of treated hypertension, diabetes and hyperlipidemia.  She apparently had a myocardial infarction and had stenting of her first diagonal branch with occluded LAD.  She is complaining of discomfort in both legs both at rest and with ambulation for the last 6 months which is fairly symmetric.  She had Dopplers performed/26/19's that showed only minimally reduced ABIs bilaterally.   Current Meds  Medication Sig   acetaminophen (TYLENOL) 325 MG tablet Take 2 tablets (650 mg total) by mouth every 6 (six) hours as needed for mild pain or headache.   allopurinol (ZYLOPRIM) 300 MG tablet Take 300 mg by mouth daily.   apixaban (ELIQUIS) 2.5 MG TABS tablet Take 1 tablet (2.5 mg total) by mouth 2 (two) times daily.   Biotin 2500 MCG CAPS Take 2,500 mcg by mouth.   Boswellia-Glucosamine-Vit D (OSTEO BI-FLEX ONE PER DAY) TABS as directed Orally   Calcium Carb-Cholecalciferol (CALCIUM + D3) 600-800 MG-UNIT TABS Take 1 tablet by mouth daily.   Cyanocobalamin (VITAMIN B12) 1000 MCG TBCR Take 1 tablet by mouth daily.   dapagliflozin propanediol (FARXIGA) 10 MG TABS tablet Take 10 mg by mouth daily.   ezetimibe (ZETIA) 10 MG tablet Take 1 tablet by mouth daily.   furosemide (LASIX) 40 MG tablet Take 20 mg by mouth 2 (two) times a week.   insulin glargine (LANTUS SOLOSTAR) 100 UNIT/ML Solostar Pen Inject 9 Units into the skin daily.   Magnesium Oxide 400 (240 Mg) MG TABS Take 1 tablet (400 mg total) by mouth daily.   metoprolol succinate (TOPROL-XL) 25 MG 24 hr tablet Take  25 mg by mouth daily.   Multiple Vitamins-Minerals (CENTRUM SILVER 50+WOMEN) TABS Take 1 tablet by mouth daily.   nitroGLYCERIN (NITROSTAT) 0.4 MG SL tablet Place 1 tablet (0.4 mg total) under the tongue every 5 (five) minutes as needed for chest pain.   senna-docusate (SENOKOT-S) 8.6-50 MG tablet Take 1 tablet by mouth 2 (two) times daily.   TRUE METRIX BLOOD GLUCOSE TEST test strip 1 each by Other route as needed.   UNABLE TO FIND Med Name: pt reports she is using a different insulin pen but does not remember the name of the insulin.   UNABLE TO FIND Med Name: reports taking otc stool softener as needed     Allergies  Allergen Reactions   Mirtazapine     Other reaction(s): nightmares   Sulfamethoxazole-Trimethoprim     Other reaction(s): Unknown   Elemental Sulfur Rash    Rash/flushed, blood red eyes    Social History   Socioeconomic History   Marital status: Divorced    Spouse name: Not on file   Number of children: 2   Years of education: 8th   Highest education level: Not on file  Occupational History   Occupation: N/A  Tobacco Use   Smoking status: Former    Packs/day: 0.50    Years: 5.00    Total pack years: 2.50    Types: Cigarettes    Quit date: 02/19/1978    Years since  quitting: 43.9   Smokeless tobacco: Never   Tobacco comments:    Quit 1982  Vaping Use   Vaping Use: Never used  Substance and Sexual Activity   Alcohol use: No    Alcohol/week: 0.0 standard drinks of alcohol   Drug use: No   Sexual activity: Not on file  Other Topics Concern   Not on file  Social History Narrative   Drinks 2 cups of coffee a day    Social Determinants of Health   Financial Resource Strain: Not on file  Food Insecurity: Not on file  Transportation Needs: Not on file  Physical Activity: Not on file  Stress: Not on file  Social Connections: Not on file  Intimate Partner Violence: Not on file     Review of Systems: General: negative for chills, fever, night sweats  or weight changes.  Cardiovascular: negative for chest pain, dyspnea on exertion, edema, orthopnea, palpitations, paroxysmal nocturnal dyspnea or shortness of breath Dermatological: negative for rash Respiratory: negative for cough or wheezing Urologic: negative for hematuria Abdominal: negative for nausea, vomiting, diarrhea, bright red blood per rectum, melena, or hematemesis Neurologic: negative for visual changes, syncope, or dizziness All other systems reviewed and are otherwise negative except as noted above.    Blood pressure 118/64, pulse (!) 109, height '5\' 4"'$  (1.626 m), weight 174 lb 9.6 oz (79.2 kg), SpO2 98 %.  General appearance: alert and no distress Neck: no adenopathy, no carotid bruit, no JVD, supple, symmetrical, trachea midline, and thyroid not enlarged, symmetric, no tenderness/mass/nodules Lungs: clear to auscultation bilaterally Heart: regular rate and rhythm, S1, S2 normal, no murmur, click, rub or gallop Extremities: extremities normal, atraumatic, no cyanosis or edema Pulses: Palpable but reduced pedal pulses Skin: Skin color, texture, turgor normal. No rashes or lesions Neurologic: Grossly normal  EKG atrial fibrillation with a ventricular sponsor 109, poor R wave progression, left axis deviation, nonspecific ST and T wave changes.  I personally reviewed this EKG.  ASSESSMENT AND PLAN:   Peripheral arterial disease ([place]) [name] was referred by Dr. Shelia Media  for evaluation of PAD.  She has a history of treated hypertension, hyperlipidemia and diabetes.  She has had CAD in the past as well.  She is complaining of right and left lower extremity discomfort which is somewhat atypical although there are some claudication symptoms.  She did have ABIs performed/29/19 which were essentially normal.  We will recheck lower extremity arterial Doppler studies.     Lorretta Harp MD FACP,FACC,FAHA, [facility] 02/06/2022 2:24 PM

## 2022-02-06 NOTE — Patient Instructions (Signed)
Medication Instructions:  Your physician recommends that you continue on your current medications as directed. Please refer to the Current Medication list given to you today.  *If you need a refill on your cardiac medications before your next appointment, please call your pharmacy*   Testing/Procedures: Your physician has requested that you have a lower extremity arterial duplex. During this test, ultrasound is used to evaluate arterial blood flow in the legs. Allow one hour for this exam. There are no restrictions or special instructions. This will take place at [place], [address].  Your physician has requested that you have an ankle brachial index (ABI). During this test an ultrasound and blood pressure cuff are used to evaluate the arteries that supply the arms and legs with blood. Allow thirty minutes for this exam. There are no restrictions or special instructions. This will take place at [place], [address].     Follow-Up: At [facility], you and your health needs are our priority.  As part of our continuing mission to provide you with exceptional heart care, we have created designated Provider Care Teams.  These Care Teams include your primary Cardiologist (physician) and Advanced Practice Providers (APPs -  Physician Assistants and Nurse Practitioners) who all work together to provide you with the care you need, when you need it.  We recommend signing up for the patient portal called "MyChart".  Sign up information is provided on this After Visit Summary.  MyChart is used to connect with patients for Virtual Visits (Telemedicine).  Patients are able to view lab/test results, encounter notes, upcoming appointments, etc.  Non-urgent messages can be sent to your provider as well.   To learn more about what you can do with MyChart, go to [email or web address].    Your next appointment:   We will see you on an as needed basis.   Provider:   Quay Burow,  MD

## 2022-02-22 DIAGNOSIS — I4891 Unspecified atrial fibrillation: Secondary | ICD-10-CM | POA: Diagnosis not present

## 2022-02-22 DIAGNOSIS — E7800 Pure hypercholesterolemia, unspecified: Secondary | ICD-10-CM | POA: Diagnosis not present

## 2022-02-22 DIAGNOSIS — E1121 Type 2 diabetes mellitus with diabetic nephropathy: Secondary | ICD-10-CM | POA: Diagnosis not present

## 2022-02-22 DIAGNOSIS — N1831 Chronic kidney disease, stage 3a: Secondary | ICD-10-CM | POA: Diagnosis not present

## 2022-02-22 DIAGNOSIS — I10 Essential (primary) hypertension: Secondary | ICD-10-CM | POA: Diagnosis not present

## 2022-02-26 DIAGNOSIS — N1832 Chronic kidney disease, stage 3b: Secondary | ICD-10-CM | POA: Diagnosis not present

## 2022-02-28 ENCOUNTER — Ambulatory Visit (HOSPITAL_COMMUNITY)
Admission: RE | Admit: 2022-02-28 | Payer: Medicare HMO | Source: Ambulatory Visit | Attending: Cardiovascular Disease | Admitting: Cardiovascular Disease

## 2022-03-14 ENCOUNTER — Ambulatory Visit (HOSPITAL_COMMUNITY): Payer: Medicare HMO

## 2022-03-14 ENCOUNTER — Encounter (HOSPITAL_COMMUNITY): Payer: Medicare HMO

## 2022-03-26 DIAGNOSIS — E1165 Type 2 diabetes mellitus with hyperglycemia: Secondary | ICD-10-CM | POA: Diagnosis not present

## 2022-03-26 DIAGNOSIS — I480 Paroxysmal atrial fibrillation: Secondary | ICD-10-CM | POA: Diagnosis not present

## 2022-03-26 DIAGNOSIS — Z0000 Encounter for general adult medical examination without abnormal findings: Secondary | ICD-10-CM | POA: Diagnosis not present

## 2022-03-26 DIAGNOSIS — D582 Other hemoglobinopathies: Secondary | ICD-10-CM | POA: Diagnosis not present

## 2022-03-26 DIAGNOSIS — M810 Age-related osteoporosis without current pathological fracture: Secondary | ICD-10-CM | POA: Diagnosis not present

## 2022-03-26 DIAGNOSIS — E7800 Pure hypercholesterolemia, unspecified: Secondary | ICD-10-CM | POA: Diagnosis not present

## 2022-03-26 DIAGNOSIS — I2510 Atherosclerotic heart disease of native coronary artery without angina pectoris: Secondary | ICD-10-CM | POA: Diagnosis not present

## 2022-03-28 ENCOUNTER — Ambulatory Visit: Payer: Medicare HMO | Attending: Cardiovascular Disease | Admitting: Cardiovascular Disease

## 2022-03-28 ENCOUNTER — Encounter: Payer: Self-pay | Admitting: Cardiovascular Disease

## 2022-03-28 VITALS — BP 123/77 | HR 67 | Ht 64.0 in | Wt 173.4 lb

## 2022-03-28 DIAGNOSIS — I2510 Atherosclerotic heart disease of native coronary artery without angina pectoris: Secondary | ICD-10-CM | POA: Diagnosis not present

## 2022-03-28 DIAGNOSIS — I482 Chronic atrial fibrillation, unspecified: Secondary | ICD-10-CM

## 2022-03-28 NOTE — Progress Notes (Signed)
Cardiology Office Note   Date:  03/28/2022   ID:  Teresa Peck, Teresa Peck 06/16/1940, MRN [id number]  PCP:  Deland Pretty, MD  Cardiologist:   Mertie Moores, MD   No chief complaint on file.  Problem list: 1. Coronary artery disease: She has an occluded LAD. She status post stenting of her first diagonal 2. Hyperlipidemia 3. Chronic systolic congestive heart failure 4. Essential hypertension 5. 5. Diabetes mellitus - peripheral neuropathy    Teresa Peck is a 82 y.o. female who presents for follow-up of her coronary artery disease. She's been seen in the past by Dr. Rockey Situ. She was seen with her Daughter, [name].  She had a cath in 2004 and had stenting of her 1st diag. Dr. Minna Antis has stopped some of her meds ( Coreg) also stopped allopurinol and Digoxin.   She denies any cp.  Breathing is ok.   Eats lots of Progresso soup.  She does not exercise.   Knows that she needs to walk more.  Has claudication with ambulation .   Aug. 30, 2016:   Doing well.  Complains of leg pain - was told that she had diabetic neuropathy Lots of leg swelling ,  Still eating lots of soup and canned foods.   Dec. 2 , 2016:  Breathing is good.  Leg Swelling is better  She thinks the Actos is causing fluid retention and weight gain .   May 17, 2015:  Has had her back surgery  ,.   Legs and back feel much better.  She was hypotensive prior to her back surgery and we held her coreg and Quinipril   Sept. 19, 2017:  Teresa Peck is seen back for a follow up visit. Has cut out her salt. Has lots 8 lbs.  Breathing is ok   Cannot tell that her HR is irregular.   Sept. 11, 2018:  Seen today for follow  Breathing is going well.   June 04, 2017:   Teresa Peck is seen today for follow-up of her coronary artery disease, chronic systolic congestive heart failure, atrial fibrillation.  She also has a history of hyperlipidemia.  Has bilateral leg pain when she walks   February 26, 2018  Seen for follow  Up  Still has frequent episodes of leg pain, she admits that she does not walk enough. Her breathing seems to be okay.  She is not having any episodes of chest pain.  Nov. 6, 2020   Doing well. Still in Afib  Had a serious fall in July,  ? Syncope  Was in the hospital off and on through most of the month of July  Doing well , no syncope   August 10, 2019: Teresa Peck is seen today for follow-up of her congestive heart failure, atrial fibrillation, coronary artery disease. Seen with daughter, Jenny Reichmann .  Has developed a tremor.   Cannot find a solution to her tremor .  No CP or dyspnea   Feb. 18, 2022: Teresa Peck is seen today for follow up of her CHF, Afib, CAD . No cp, no dyspnea.    Complains of bilateral calf pain .  She was concerned about PAD. Screening ABIs in 2019 were normal .  Bp is borderline low today  Drinks water but does not eat much protein   January 10, 2021: Teresa Peck is seen today for follow-up of her atrial fibrillation, congestive heart failure, coronary artery disease. Not feeling very well - no cardiac complaints  HR and BP are  well controlled.  She brought her container of "total Beets" and asked if it was safe. It seems to be safe.    Feb. 7, 2024 Seen with daughter, [name]   No CP or dyspnea     Past Medical History:  Diagnosis Date   Anemia    Arthritis    CAD (coronary artery disease)    CAD (coronary artery disease)    Cancer ([place])    skin cancer   CHF (congestive heart failure) ([facility])    Coronary artery disease    MI 2004-stent   Diabetes mellitus    Diabetic neuropathy ([facility])    GERD (gastroesophageal reflux disease)    tums   Gout    Hiatal hernia    Hyperlipidemia    Hypertension    PAD (peripheral artery disease) ([place])    Tremor     Past Surgical History:  Procedure Laterality Date   BILATERAL CORNEA IMPLANTS  2010   CARDIAC CATHETERIZATION     stent post MI    CHOLECYSTECTOMY     CORONARY  ANGIOPLASTY     2004  stent   EYE SURGERY     KNEE SURGERY  1974   right   LUMBAR LAMINECTOMY/DECOMPRESSION MICRODISCECTOMY N/A 04/13/2015   Procedure: L4-5 Decompression;  Surgeon: Marybelle Killings, MD;  Location: [facility];  Service: Orthopedics;  Laterality: N/A;  Need RNFA     Current Outpatient Medications  Medication Sig Dispense Refill   acetaminophen (TYLENOL) 325 MG tablet Take 2 tablets (650 mg total) by mouth every 6 (six) hours as needed for mild pain or headache.     allopurinol (ZYLOPRIM) 300 MG tablet Take 300 mg by mouth daily.     apixaban (ELIQUIS) 2.5 MG TABS tablet Take 1 tablet (2.5 mg total) by mouth 2 (two) times daily. 180 tablet 3   Biotin 2500 MCG CAPS Take 2,500 mcg by mouth.     Boswellia-Glucosamine-Vit D (OSTEO BI-FLEX ONE PER DAY) TABS as directed Orally     Calcium Carb-Cholecalciferol (CALCIUM + D3) 600-800 MG-UNIT TABS Take 1 tablet by mouth daily.     Cyanocobalamin (VITAMIN B12) 1000 MCG TBCR Take 1 tablet by mouth daily.     dapagliflozin propanediol (FARXIGA) 10 MG TABS tablet Take 10 mg by mouth daily.     ezetimibe (ZETIA) 10 MG tablet Take 1 tablet by mouth daily.     furosemide (LASIX) 40 MG tablet Take 20 mg by mouth 2 (two) times a week.     insulin glargine (LANTUS SOLOSTAR) 100 UNIT/ML Solostar Pen Inject 9 Units into the skin daily.     Magnesium Oxide 400 (240 Mg) MG TABS Take 1 tablet (400 mg total) by mouth daily. 30 tablet    metoprolol succinate (TOPROL-XL) 25 MG 24 hr tablet Take 25 mg by mouth daily.     Multiple Vitamins-Minerals (CENTRUM SILVER 50+WOMEN) TABS Take 1 tablet by mouth daily.     nitroGLYCERIN (NITROSTAT) 0.4 MG SL tablet Place 1 tablet (0.4 mg total) under the tongue every 5 (five) minutes as needed for chest pain. 25 tablet 3   rosuvastatin (CRESTOR) 10 MG tablet Take 10 mg by mouth daily.     senna-docusate (SENOKOT-S) 8.6-50 MG tablet Take 1 tablet by mouth 2 (two) times daily.     TRUE METRIX BLOOD GLUCOSE TEST test strip 1  each by Other route as needed.     UNABLE TO FIND Med Name: pt reports she is using a different insulin  pen but does not remember the name of the insulin.     UNABLE TO FIND Med Name: reports taking otc stool softener as needed     gabapentin (NEURONTIN) 600 MG tablet Take 600 mg by mouth 2 (two) times daily.   (Patient not taking: Reported on 03/28/2022)     HYDROcodone-acetaminophen (NORCO/VICODIN) 5-325 MG tablet Take 1 tablet by mouth every 8 (eight) hours as needed for moderate pain. (Patient not taking: Reported on 03/28/2022) 20 tablet 0   No current facility-administered medications for this visit.    Allergies:   Mirtazapine, Sulfamethoxazole-trimethoprim, and Elemental sulfur    Social History:  The patient  reports that she quit smoking about 44 years ago. Her smoking use included cigarettes. She has a 2.50 pack-year smoking history. She has never used smokeless tobacco. She reports that she does not drink alcohol and does not use drugs.   Family History:  The patient's family history includes CAD in her mother; Cancer in her mother; Heart attack in her brother, father, and sister; Heart disease in her mother; Stroke in her mother.    ROS: Noted in current history, otherwise review of systems is negative.   Physical Exam: Blood pressure 123/77, pulse 67, height '5\' 4"'$  (1.626 m), weight 173 lb 6.4 oz (78.7 kg), SpO2 98 %.       GEN:  Well nourished, well developed in no acute distress HEENT: Normal NECK: No JVD; No carotid bruits LYMPHATICS: No lymphadenopathy CARDIAC: atrial fib , controlled ventricular response  RESPIRATORY:  Clear to auscultation without rales, wheezing or rhonchi  ABDOMEN: Soft, non-tender, non-distended MUSCULOSKELETAL:  No edema; No deformity  SKIN: Warm and dry NEUROLOGIC:  Alert and oriented x 3   EKG:        Dec. 19, 2023.  Afib with RVR at 109    Recent Labs: No results found for requested labs within last 365 days.    Lipid Panel     Component Value Date/Time   CHOL 115 02/26/2018 1631   TRIG 112 02/26/2018 1631   HDL 52 02/26/2018 1631   CHOLHDL 2.2 02/26/2018 1631   LDLCALC 41 02/26/2018 1631      Wt Readings from Last 3 Encounters:  03/28/22 173 lb 6.4 oz (78.7 kg)  02/06/22 174 lb 9.6 oz (79.2 kg)  01/10/21 178 lb 3.2 oz (80.8 kg)    Other studies Reviewed: Additional studies/ records that were reviewed today include: . Review of the above records demonstrates:   ASSESSMENT AND PLAN:  1. Coronary artery disease:   no angina   2. Atrial Fibrillation :   CHADS2VASC is  63  ( female, age 72, DM,  HTN)     She is on eliquis 2.5 bid HR is well controlled.    3. Chronic systolic congestive heart failure:  symptoms seem to be stable.   4. Essential hypertension -.   BP is well controlled   5. Diabetes mellitus -   6. Leg pain :      has PAD .  Follows with Dr. Gwenlyn Found   Current medicines are reviewed at length with the patient today.  The patient does not have concerns regarding medicines.    Disposition:  to see an APP in 1 year .    Mertie Moores, MD  03/28/2022 3:52 PM    [facility] [address], [place], [place]  [address] Phone: [phone]; Fax: [phone]

## 2022-03-28 NOTE — Patient Instructions (Signed)
Medication Instructions:  Your physician recommends that you continue on your current medications as directed. Please refer to the Current Medication list given to you today.  *If you need a refill on your cardiac medications before your next appointment, please call your pharmacy*  Follow-Up: At [facility], you and your health needs are our priority.  As part of our continuing mission to provide you with exceptional heart care, we have created designated Provider Care Teams.  These Care Teams include your primary Cardiologist (physician) and Advanced Practice Providers (APPs -  Physician Assistants and Nurse Practitioners) who all work together to provide you with the care you need, when you need it.  We recommend signing up for the patient portal called "MyChart".  Sign up information is provided on this After Visit Summary.  MyChart is used to connect with patients for Virtual Visits (Telemedicine).  Patients are able to view lab/test results, encounter notes, upcoming appointments, etc.  Non-urgent messages can be sent to your provider as well.   To learn more about what you can do with MyChart, go to [email or web address].    Your next appointment:   1 year(s)  Provider:   Philip Nahser, MD    

## 2022-04-04 ENCOUNTER — Ambulatory Visit (HOSPITAL_COMMUNITY)
Admission: RE | Admit: 2022-04-04 | Discharge: 2022-04-04 | Disposition: A | Discharge status: Home / Self Care | Payer: Medicare HMO | Source: Ambulatory Visit | Attending: Cardiovascular Disease | Admitting: Cardiovascular Disease

## 2022-04-04 DIAGNOSIS — I739 Peripheral vascular disease, unspecified: Secondary | ICD-10-CM

## 2022-04-04 LAB — VAS US ABI WITH/WO TBI
Left ABI: 1.06
Right ABI: 1.07

## 2022-04-28 DIAGNOSIS — L659 Nonscarring hair loss, unspecified: Secondary | ICD-10-CM | POA: Diagnosis not present

## 2022-04-28 DIAGNOSIS — J019 Acute sinusitis, unspecified: Secondary | ICD-10-CM | POA: Diagnosis not present

## 2022-04-28 DIAGNOSIS — E139 Other specified diabetes mellitus without complications: Secondary | ICD-10-CM | POA: Diagnosis not present

## 2022-05-07 DIAGNOSIS — H6993 Unspecified Eustachian tube disorder, bilateral: Secondary | ICD-10-CM | POA: Diagnosis not present

## 2022-05-07 DIAGNOSIS — J019 Acute sinusitis, unspecified: Secondary | ICD-10-CM | POA: Diagnosis not present

## 2022-05-07 DIAGNOSIS — E785 Hyperlipidemia, unspecified: Secondary | ICD-10-CM | POA: Diagnosis not present

## 2022-05-07 DIAGNOSIS — E1165 Type 2 diabetes mellitus with hyperglycemia: Secondary | ICD-10-CM | POA: Diagnosis not present

## 2022-05-24 DIAGNOSIS — H9193 Unspecified hearing loss, bilateral: Secondary | ICD-10-CM | POA: Diagnosis not present

## 2022-05-24 DIAGNOSIS — L299 Pruritus, unspecified: Secondary | ICD-10-CM | POA: Diagnosis not present

## 2022-05-24 DIAGNOSIS — E1121 Type 2 diabetes mellitus with diabetic nephropathy: Secondary | ICD-10-CM | POA: Diagnosis not present

## 2022-05-24 DIAGNOSIS — I10 Essential (primary) hypertension: Secondary | ICD-10-CM | POA: Diagnosis not present

## 2022-05-28 DIAGNOSIS — I4891 Unspecified atrial fibrillation: Secondary | ICD-10-CM | POA: Diagnosis not present

## 2022-05-28 DIAGNOSIS — E1129 Type 2 diabetes mellitus with other diabetic kidney complication: Secondary | ICD-10-CM | POA: Diagnosis not present

## 2022-05-28 DIAGNOSIS — I130 Hypertensive heart and chronic kidney disease with heart failure and stage 1 through stage 4 chronic kidney disease, or unspecified chronic kidney disease: Secondary | ICD-10-CM | POA: Diagnosis not present

## 2022-05-28 DIAGNOSIS — R809 Proteinuria, unspecified: Secondary | ICD-10-CM | POA: Diagnosis not present

## 2022-05-28 DIAGNOSIS — N1832 Chronic kidney disease, stage 3b: Secondary | ICD-10-CM | POA: Diagnosis not present

## 2022-05-28 DIAGNOSIS — E1122 Type 2 diabetes mellitus with diabetic chronic kidney disease: Secondary | ICD-10-CM | POA: Diagnosis not present

## 2022-05-28 DIAGNOSIS — I5022 Chronic systolic (congestive) heart failure: Secondary | ICD-10-CM | POA: Diagnosis not present

## 2022-05-28 LAB — LAB REPORT - SCANNED: EGFR: 41

## 2022-07-05 DIAGNOSIS — H9203 Otalgia, bilateral: Secondary | ICD-10-CM | POA: Diagnosis not present

## 2022-07-05 DIAGNOSIS — H9041 Sensorineural hearing loss, unilateral, right ear, with unrestricted hearing on the contralateral side: Secondary | ICD-10-CM | POA: Diagnosis not present

## 2022-07-05 DIAGNOSIS — H903 Sensorineural hearing loss, bilateral: Secondary | ICD-10-CM | POA: Diagnosis not present

## 2022-07-09 DIAGNOSIS — H04123 Dry eye syndrome of bilateral lacrimal glands: Secondary | ICD-10-CM | POA: Diagnosis not present

## 2022-07-09 DIAGNOSIS — H53032 Strabismic amblyopia, left eye: Secondary | ICD-10-CM | POA: Diagnosis not present

## 2022-07-09 DIAGNOSIS — H169 Unspecified keratitis: Secondary | ICD-10-CM | POA: Diagnosis not present

## 2022-07-09 DIAGNOSIS — H16421 Pannus (corneal), right eye: Secondary | ICD-10-CM | POA: Diagnosis not present

## 2022-07-09 DIAGNOSIS — H26492 Other secondary cataract, left eye: Secondary | ICD-10-CM | POA: Diagnosis not present

## 2022-07-09 DIAGNOSIS — Z961 Presence of intraocular lens: Secondary | ICD-10-CM | POA: Diagnosis not present

## 2022-07-09 DIAGNOSIS — H40013 Open angle with borderline findings, low risk, bilateral: Secondary | ICD-10-CM | POA: Diagnosis not present

## 2022-07-11 DIAGNOSIS — Z0100 Encounter for examination of eyes and vision without abnormal findings: Secondary | ICD-10-CM | POA: Diagnosis not present

## 2022-08-21 DIAGNOSIS — H9121 Sudden idiopathic hearing loss, right ear: Secondary | ICD-10-CM | POA: Diagnosis not present

## 2022-08-21 DIAGNOSIS — H903 Sensorineural hearing loss, bilateral: Secondary | ICD-10-CM | POA: Diagnosis not present

## 2022-08-22 ENCOUNTER — Other Ambulatory Visit: Payer: Self-pay | Admitting: Physician Assistant

## 2022-08-22 DIAGNOSIS — E1165 Type 2 diabetes mellitus with hyperglycemia: Secondary | ICD-10-CM | POA: Diagnosis not present

## 2022-08-22 DIAGNOSIS — H9121 Sudden idiopathic hearing loss, right ear: Secondary | ICD-10-CM

## 2022-09-06 ENCOUNTER — Other Ambulatory Visit: Payer: Medicare HMO

## 2022-09-06 DIAGNOSIS — H9121 Sudden idiopathic hearing loss, right ear: Secondary | ICD-10-CM | POA: Diagnosis not present

## 2022-09-06 MED ORDER — GADOPICLENOL 0.5 MMOL/ML IV SOLN
8.0000 mL | Freq: Once | INTRAVENOUS | Status: AC | PRN
  Administered 2022-09-06: 8 mL via INTRAVENOUS

## 2022-09-26 DIAGNOSIS — R0982 Postnasal drip: Secondary | ICD-10-CM | POA: Diagnosis not present

## 2022-09-26 DIAGNOSIS — I10 Essential (primary) hypertension: Secondary | ICD-10-CM | POA: Diagnosis not present

## 2022-09-26 DIAGNOSIS — E1121 Type 2 diabetes mellitus with diabetic nephropathy: Secondary | ICD-10-CM | POA: Diagnosis not present

## 2022-09-26 DIAGNOSIS — I4891 Unspecified atrial fibrillation: Secondary | ICD-10-CM | POA: Diagnosis not present

## 2022-11-29 DIAGNOSIS — Z79899 Other long term (current) drug therapy: Secondary | ICD-10-CM | POA: Diagnosis not present

## 2022-11-29 DIAGNOSIS — E7800 Pure hypercholesterolemia, unspecified: Secondary | ICD-10-CM | POA: Diagnosis not present

## 2022-11-29 DIAGNOSIS — R5383 Other fatigue: Secondary | ICD-10-CM | POA: Diagnosis not present

## 2022-11-29 DIAGNOSIS — E1165 Type 2 diabetes mellitus with hyperglycemia: Secondary | ICD-10-CM | POA: Diagnosis not present

## 2022-12-06 DIAGNOSIS — Z23 Encounter for immunization: Secondary | ICD-10-CM | POA: Diagnosis not present

## 2022-12-06 DIAGNOSIS — E1165 Type 2 diabetes mellitus with hyperglycemia: Secondary | ICD-10-CM | POA: Diagnosis not present

## 2022-12-06 DIAGNOSIS — E7800 Pure hypercholesterolemia, unspecified: Secondary | ICD-10-CM | POA: Diagnosis not present

## 2022-12-06 DIAGNOSIS — R5382 Chronic fatigue, unspecified: Secondary | ICD-10-CM | POA: Diagnosis not present

## 2022-12-24 ENCOUNTER — Other Ambulatory Visit: Payer: Self-pay | Admitting: Medical Oncology

## 2022-12-24 DIAGNOSIS — D582 Other hemoglobinopathies: Secondary | ICD-10-CM

## 2022-12-25 ENCOUNTER — Inpatient Hospital Stay: Payer: Medicare HMO | Attending: Internal Medicine | Admitting: Internal Medicine

## 2022-12-25 ENCOUNTER — Other Ambulatory Visit: Payer: Self-pay

## 2022-12-25 ENCOUNTER — Other Ambulatory Visit: Payer: Self-pay | Admitting: Medical Oncology

## 2022-12-25 ENCOUNTER — Inpatient Hospital Stay: Payer: Medicare HMO

## 2022-12-25 VITALS — BP 114/54 | HR 88 | Temp 97.7°F | Resp 16 | Ht 64.0 in | Wt 166.8 lb

## 2022-12-25 DIAGNOSIS — Z79899 Other long term (current) drug therapy: Secondary | ICD-10-CM | POA: Diagnosis not present

## 2022-12-25 DIAGNOSIS — Z85828 Personal history of other malignant neoplasm of skin: Secondary | ICD-10-CM | POA: Diagnosis not present

## 2022-12-25 DIAGNOSIS — R718 Other abnormality of red blood cells: Secondary | ICD-10-CM | POA: Insufficient documentation

## 2022-12-25 DIAGNOSIS — K219 Gastro-esophageal reflux disease without esophagitis: Secondary | ICD-10-CM | POA: Insufficient documentation

## 2022-12-25 DIAGNOSIS — Z801 Family history of malignant neoplasm of trachea, bronchus and lung: Secondary | ICD-10-CM | POA: Diagnosis not present

## 2022-12-25 DIAGNOSIS — E1159 Type 2 diabetes mellitus with other circulatory complications: Secondary | ICD-10-CM | POA: Diagnosis not present

## 2022-12-25 DIAGNOSIS — R251 Tremor, unspecified: Secondary | ICD-10-CM | POA: Insufficient documentation

## 2022-12-25 DIAGNOSIS — I10 Essential (primary) hypertension: Secondary | ICD-10-CM | POA: Insufficient documentation

## 2022-12-25 DIAGNOSIS — D582 Other hemoglobinopathies: Secondary | ICD-10-CM

## 2022-12-25 DIAGNOSIS — I739 Peripheral vascular disease, unspecified: Secondary | ICD-10-CM

## 2022-12-25 DIAGNOSIS — Z87891 Personal history of nicotine dependence: Secondary | ICD-10-CM | POA: Insufficient documentation

## 2022-12-25 DIAGNOSIS — D751 Secondary polycythemia: Secondary | ICD-10-CM

## 2022-12-25 DIAGNOSIS — H9191 Unspecified hearing loss, right ear: Secondary | ICD-10-CM | POA: Insufficient documentation

## 2022-12-25 DIAGNOSIS — R5383 Other fatigue: Secondary | ICD-10-CM | POA: Insufficient documentation

## 2022-12-25 DIAGNOSIS — I2510 Atherosclerotic heart disease of native coronary artery without angina pectoris: Secondary | ICD-10-CM | POA: Insufficient documentation

## 2022-12-25 LAB — CMP (CANCER CENTER ONLY)
ALT: 23 U/L (ref 0–44)
AST: 26 U/L (ref 15–41)
Albumin: 4.1 g/dL (ref 3.5–5.0)
Alkaline Phosphatase: 62 U/L (ref 38–126)
Anion gap: 6 (ref 5–15)
BUN: 20 mg/dL (ref 8–23)
CO2: 30 mmol/L (ref 22–32)
Calcium: 10 mg/dL (ref 8.9–10.3)
Chloride: 99 mmol/L (ref 98–111)
Creatinine: 1.48 mg/dL — ABNORMAL HIGH (ref 0.44–1.00)
GFR, Estimated: 35 mL/min — ABNORMAL LOW (ref >60)
Glucose, Bld: 158 mg/dL — ABNORMAL HIGH (ref 70–99)
Potassium: 4.3 mmol/L (ref 3.5–5.1)
Sodium: 135 mmol/L (ref 135–145)
Total Bilirubin: 0.8 mg/dL (ref <1.2)
Total Protein: 7 g/dL (ref 6.5–8.1)

## 2022-12-25 LAB — IRON AND IRON BINDING CAPACITY (CC-WL,HP ONLY)
Iron: 174 ug/dL — ABNORMAL HIGH (ref 28–170)
Saturation Ratios: 60 % — ABNORMAL HIGH (ref 10.4–31.8)
TIBC: 290 ug/dL (ref 250–450)
UIBC: 116 ug/dL — ABNORMAL LOW (ref 148–442)

## 2022-12-25 LAB — CBC WITH DIFFERENTIAL (CANCER CENTER ONLY)
Abs Immature Granulocytes: 0.02 10*3/uL (ref 0.00–0.07)
Basophils Absolute: 0 10*3/uL (ref 0.0–0.1)
Basophils Relative: 1 %
Eosinophils Absolute: 0.1 10*3/uL (ref 0.0–0.5)
Eosinophils Relative: 1 %
HCT: 50.4 % — ABNORMAL HIGH (ref 36.0–46.0)
Hemoglobin: 17.1 g/dL — ABNORMAL HIGH (ref 12.0–15.0)
Immature Granulocytes: 0 %
Lymphocytes Relative: 26 %
Lymphs Abs: 1.7 10*3/uL (ref 0.7–4.0)
MCH: 32.4 pg (ref 26.0–34.0)
MCHC: 33.9 g/dL (ref 30.0–36.0)
MCV: 95.5 fL (ref 80.0–100.0)
Monocytes Absolute: 0.5 10*3/uL (ref 0.1–1.0)
Monocytes Relative: 7 %
Neutro Abs: 4.3 10*3/uL (ref 1.7–7.7)
Neutrophils Relative %: 65 %
Platelet Count: 176 10*3/uL (ref 150–400)
RBC: 5.28 MIL/uL — ABNORMAL HIGH (ref 3.87–5.11)
RDW: 13.8 % (ref 11.5–15.5)
WBC Count: 6.7 10*3/uL (ref 4.0–10.5)
nRBC: 0 % (ref 0.0–0.2)

## 2022-12-25 LAB — LACTATE DEHYDROGENASE: LDH: 165 U/L (ref 98–192)

## 2022-12-25 LAB — VITAMIN B12: Vitamin B-12: 1329 pg/mL — ABNORMAL HIGH (ref 180–914)

## 2022-12-25 LAB — FOLATE: Folate: 34.3 ng/mL (ref >5.9)

## 2022-12-25 LAB — FERRITIN: Ferritin: 197 ng/mL (ref 11–307)

## 2022-12-25 NOTE — Progress Notes (Signed)
[facility] [phone]   [phone]  CONSULT NOTE  REFERRING PHYSICIAN: Dr. Merri Brunette  REASON FOR CONSULTATION:  82 years old white female with polycythemia.  HPI Teresa Peck is a 82 y.o. female to the clinic today accompanied by 2 of her nieces. Discussed the use of AI scribe software for clinical note transcription with the patient, who gave verbal consent to proceed.  History of Present Illness   Teresa Peck, an 82 year old patient with a history of anemia, coronary artery disease, skin cancer, diabetes, acid reflux, hypertension, and hypercholesterolemia, was referred by her primary care physician due to elevated red blood cells. The patient reported feeling consistently unwell, with a significant lack of energy and a sensation of being dragged down. She also reported a sudden loss of hearing in the right ear two months prior, which was investigated with an MRI and hearing test, both of which were reported as normal.  The patient also reported a skin condition, which she described as psoriasis, present for almost two years. She had been taking a supplement, N acetyl L cystine, for about three weeks to boost her immune system, but stopped due to feeling unwell. She also reported taking multiple other supplements, including B12, B3, biotin, osteo biflex, Centrum Silver, and magnesium oxide.  The patient also reported shaking, particularly from the waist down, which her primary care physician attributed to her insulin. She also reported occasional blurry vision and headaches localized to the right side of the front of the head.  The patient has a family history of heart disease and lung cancer. She is divorced with two children, one of whom is deceased due to lung cancer. She is a former smoker and drinker, and reported a long-term diet low in red meat. She also reported taking Furosemide 20mg  once or twice a week for occasional leg  swelling. LABS Hb: 17.0 g/dL (13/09/6576) Hct: 46.9% (12/11/2022) WBC: 8.1 x10^3/uL (12/11/2022) PLT: 152,000/uL (12/11/2022)       Past Medical History:  Diagnosis Date   Anemia    Arthritis    CAD (coronary artery disease)    CAD (coronary artery disease)    Cancer ([facility])    skin cancer   CHF (congestive heart failure) ([facility])    Coronary artery disease    MI 2004-stent   Diabetes mellitus    Diabetic neuropathy ([facility])    GERD (gastroesophageal reflux disease)    tums   Gout    Hiatal hernia    Hyperlipidemia    Hypertension    PAD (peripheral artery disease) ([facility])    Tremor     Past Surgical History:  Procedure Laterality Date   BILATERAL CORNEA IMPLANTS  2010   CARDIAC CATHETERIZATION     stent post MI    CHOLECYSTECTOMY     CORONARY ANGIOPLASTY     2004  stent   EYE SURGERY     KNEE SURGERY  1974   right   LUMBAR LAMINECTOMY/DECOMPRESSION MICRODISCECTOMY N/A 04/13/2015   Procedure: L4-5 Decompression;  Surgeon: Eldred Manges, MD;  Location: [facility];  Service: Orthopedics;  Laterality: N/A;  Need RNFA    Family History  Problem Relation Age of Onset   Heart attack Father    Heart attack Sister    Heart attack Brother    CAD Mother    Stroke Mother    Heart disease Mother    Cancer Mother     Social History Social History  Tobacco Use   Smoking status: Former    Current packs/day: 0.00    Average packs/day: 0.5 packs/day for 5.0 years (2.5 ttl pk-yrs)    Types: Cigarettes    Start date: 02/19/1973    Quit date: 02/19/1978    Years since quitting: 44.8   Smokeless tobacco: Never   Tobacco comments:    Quit 1982  Vaping Use   Vaping status: Never Used  Substance Use Topics   Alcohol use: No    Alcohol/week: 0.0 standard drinks of alcohol   Drug use: No    Allergies  Allergen Reactions   Mirtazapine     Other reaction(s): nightmares   Sulfamethoxazole-Trimethoprim     Other reaction(s): Unknown   Elemental Sulfur Rash    Rash/flushed, blood  red eyes    Current Outpatient Medications  Medication Sig Dispense Refill   acetaminophen (TYLENOL) 325 MG tablet Take 2 tablets (650 mg total) by mouth every 6 (six) hours as needed for mild pain or headache.     allopurinol (ZYLOPRIM) 300 MG tablet Take 300 mg by mouth daily.     apixaban (ELIQUIS) 2.5 MG TABS tablet Take 1 tablet (2.5 mg total) by mouth 2 (two) times daily. 180 tablet 3   Biotin 2500 MCG CAPS Take 2,500 mcg by mouth.     Boswellia-Glucosamine-Vit D (OSTEO BI-FLEX ONE PER DAY) TABS as directed Orally     Calcium Carb-Cholecalciferol (CALCIUM + D3) 600-800 MG-UNIT TABS Take 1 tablet by mouth daily.     Cyanocobalamin (VITAMIN B12) 1000 MCG TBCR Take 1 tablet by mouth daily.     dapagliflozin propanediol (FARXIGA) 10 MG TABS tablet Take 10 mg by mouth daily.     ezetimibe (ZETIA) 10 MG tablet Take 1 tablet by mouth daily.     furosemide (LASIX) 40 MG tablet Take 20 mg by mouth 2 (two) times a week.     gabapentin (NEURONTIN) 600 MG tablet Take 600 mg by mouth 2 (two) times daily.   (Patient not taking: Reported on 03/28/2022)     HYDROcodone-acetaminophen (NORCO/VICODIN) 5-325 MG tablet Take 1 tablet by mouth every 8 (eight) hours as needed for moderate pain. (Patient not taking: Reported on 03/28/2022) 20 tablet 0   insulin glargine (LANTUS SOLOSTAR) 100 UNIT/ML Solostar Pen Inject 9 Units into the skin daily.     Magnesium Oxide 400 (240 Mg) MG TABS Take 1 tablet (400 mg total) by mouth daily. 30 tablet    metoprolol succinate (TOPROL-XL) 25 MG 24 hr tablet Take 25 mg by mouth daily.     Multiple Vitamins-Minerals (CENTRUM SILVER 50+WOMEN) TABS Take 1 tablet by mouth daily.     nitroGLYCERIN (NITROSTAT) 0.4 MG SL tablet Place 1 tablet (0.4 mg total) under the tongue every 5 (five) minutes as needed for chest pain. 25 tablet 3   rosuvastatin (CRESTOR) 10 MG tablet Take 10 mg by mouth daily.     senna-docusate (SENOKOT-S) 8.6-50 MG tablet Take 1 tablet by mouth 2 (two) times  daily.     TRUE METRIX BLOOD GLUCOSE TEST test strip 1 each by Other route as needed.     UNABLE TO FIND Med Name: pt reports she is using a different insulin pen but does not remember the name of the insulin.     UNABLE TO FIND Med Name: reports taking otc stool softener as needed     No current facility-administered medications for this visit.    Review of Systems  Constitutional: positive for fatigue  Eyes: negative Ears, nose, mouth, throat, and face: negative Respiratory: negative Cardiovascular: negative Gastrointestinal: negative Genitourinary:negative Integument/breast: negative Hematologic/lymphatic: negative Musculoskeletal:positive for arthralgias and muscle weakness Neurological: negative Behavioral/Psych: negative Endocrine: negative Allergic/Immunologic: negative  Physical Exam  WUX:LKGMW, healthy, no distress, well nourished, and well developed SKIN: skin color, texture, turgor are normal, no rashes or significant lesions HEAD: Normocephalic, No masses, lesions, tenderness or abnormalities EYES: normal, PERRLA, Conjunctiva are pink and non-injected EARS: External ears normal, Canals clear OROPHARYNX:no exudate, no erythema, and lips, buccal mucosa, and tongue normal  NECK: supple, no adenopathy, no JVD LYMPH:  no palpable lymphadenopathy, no hepatosplenomegaly BREAST:not examined LUNGS: clear to auscultation , and palpation HEART: regular rate & rhythm, no murmurs, and no gallops ABDOMEN:abdomen soft, non-tender, and normal bowel sounds BACK: Back symmetric, no curvature., No CVA tenderness EXTREMITIES:no joint deformities, effusion, or inflammation, no edema  NEURO: alert & oriented x 3 with fluent speech, no focal motor/sensory deficits  PERFORMANCE STATUS: ECOG 1  LABORATORY DATA: Lab Results  Component Value Date   WBC 6.7 12/25/2022   HGB 17.1 (H) 12/25/2022   HCT 50.4 (H) 12/25/2022   MCV 95.5 12/25/2022   PLT 176 12/25/2022      Chemistry       Component Value Date/Time   NA 135 12/25/2022 1127   NA 144 02/26/2018 1631   K 4.3 12/25/2022 1127   CL 99 12/25/2022 1127   CO2 30 12/25/2022 1127   BUN 20 12/25/2022 1127   BUN 22 02/26/2018 1631   CREATININE 1.48 (H) 12/25/2022 1127   CREATININE 1.47 (H) 12/08/2015 1555      Component Value Date/Time   CALCIUM 10.0 12/25/2022 1127   ALKPHOS 62 12/25/2022 1127   AST 26 12/25/2022 1127   ALT 23 12/25/2022 1127   BILITOT 0.8 12/25/2022 1127       RADIOGRAPHIC STUDIES: No results found.  ASSESSMENT AND PLAN: This is a very pleasant 82 years old white female presented for evaluation of elevated hemoglobin and hematocrit likely reactive in nature secondary to excessive supplements but polycythemia vera could not be completely excluded at this point.  Blood work today including CBC showed persistent elevated hemoglobin of 17.1 hematocrit 50.4% with normal total white blood count as well as platelet count.  Ferritin level was normal at 197.  She has normal serum folate but elevated vitamin B12 of 1329.  Erythropoietin level is still pending.    Elevated Hemoglobin and Hematocrit Likely secondary to excessive vitamin and supplement intake. Discussed the possibility of polycythemia vera if levels remain high after cessation of supplements. -Discontinue all supplements except Vitamin D3 for one month. -Recheck hemoglobin and hematocrit in one month. -If levels remain high, consider ordering JAK2 mutation panel.  Hearing Loss Right-sided hearing loss with normal MRI and hearing test. -Refer to Ear, Nose, and Throat specialist for further evaluation.  General Fatigue Reports feeling "terrible" and "drugged down" with lack of energy. -Continue to monitor symptoms.  Tremors Reports shaking from the waist down. -Continue current management under primary care physician.  Diet Reports minimal meat intake. -Continue current diet, avoid increase in red meat  consumption.  Follow-up in 1 month to reassess hemoglobin and hematocrit levels.   The patient was advised to call immediately if she has any other concerning symptoms in the interval. The patient voices understanding of current disease status and treatment options and is in agreement with the current care plan.  All questions were answered. The patient knows to call the clinic  with any problems, questions or concerns. We can certainly see the patient much sooner if necessary.  Thank you so much for allowing me to participate in the care of Vickii Volland Phariss. I will continue to follow up the patient with you and assist in her care. The total time spent in the appointment was 60 minutes.  Disclaimer: This note was dictated with voice recognition software. Similar sounding words can inadvertently be transcribed and may not be corrected upon review.   Lajuana Matte December 25, 2022, 12:19 PM

## 2022-12-26 LAB — ERYTHROPOIETIN: Erythropoietin: 22.3 m[IU]/mL — ABNORMAL HIGH (ref 2.6–18.5)

## 2023-01-31 ENCOUNTER — Inpatient Hospital Stay: Payer: Medicare HMO | Attending: Internal Medicine

## 2023-01-31 ENCOUNTER — Inpatient Hospital Stay: Payer: Medicare HMO

## 2023-01-31 ENCOUNTER — Inpatient Hospital Stay (HOSPITAL_BASED_OUTPATIENT_CLINIC_OR_DEPARTMENT_OTHER): Payer: Medicare HMO | Admitting: Internal Medicine

## 2023-01-31 VITALS — BP 120/68 | HR 69 | Temp 98.0°F | Resp 18 | Ht 64.0 in | Wt 168.2 lb

## 2023-01-31 DIAGNOSIS — Z79899 Other long term (current) drug therapy: Secondary | ICD-10-CM | POA: Diagnosis not present

## 2023-01-31 DIAGNOSIS — D45 Polycythemia vera: Secondary | ICD-10-CM | POA: Diagnosis not present

## 2023-01-31 DIAGNOSIS — D751 Secondary polycythemia: Secondary | ICD-10-CM | POA: Insufficient documentation

## 2023-01-31 DIAGNOSIS — R251 Tremor, unspecified: Secondary | ICD-10-CM | POA: Insufficient documentation

## 2023-01-31 LAB — IRON AND IRON BINDING CAPACITY (CC-WL,HP ONLY)
Iron: 225 ug/dL — ABNORMAL HIGH (ref 28–170)
Saturation Ratios: 80 % — ABNORMAL HIGH (ref 10.4–31.8)
TIBC: 281 ug/dL (ref 250–450)
UIBC: 56 ug/dL — ABNORMAL LOW (ref 148–442)

## 2023-01-31 LAB — CBC WITH DIFFERENTIAL (CANCER CENTER ONLY)
Abs Immature Granulocytes: 0.08 10*3/uL — ABNORMAL HIGH (ref 0.00–0.07)
Basophils Absolute: 0 10*3/uL (ref 0.0–0.1)
Basophils Relative: 1 %
Eosinophils Absolute: 0.1 10*3/uL (ref 0.0–0.5)
Eosinophils Relative: 2 %
HCT: 49.3 % — ABNORMAL HIGH (ref 36.0–46.0)
Hemoglobin: 16.8 g/dL — ABNORMAL HIGH (ref 12.0–15.0)
Immature Granulocytes: 1 %
Lymphocytes Relative: 21 %
Lymphs Abs: 1.2 10*3/uL (ref 0.7–4.0)
MCH: 33.3 pg (ref 26.0–34.0)
MCHC: 34.1 g/dL (ref 30.0–36.0)
MCV: 97.8 fL (ref 80.0–100.0)
Monocytes Absolute: 0.3 10*3/uL (ref 0.1–1.0)
Monocytes Relative: 6 %
Neutro Abs: 4.2 10*3/uL (ref 1.7–7.7)
Neutrophils Relative %: 69 %
Platelet Count: 153 10*3/uL (ref 150–400)
RBC: 5.04 MIL/uL (ref 3.87–5.11)
RDW: 13.9 % (ref 11.5–15.5)
WBC Count: 6 10*3/uL (ref 4.0–10.5)
nRBC: 0 % (ref 0.0–0.2)

## 2023-01-31 LAB — FERRITIN: Ferritin: 202 ng/mL (ref 11–307)

## 2023-01-31 NOTE — Progress Notes (Signed)
[facility] [phone]   [phone]  OFFICE PROGRESS NOTE  Merri Brunette, MD [address] [place] [address]  DIAGNOSIS: Polycythemia likely reactive in nature  PRIOR THERAPY: None  CURRENT THERAPY: Observation   INTERVAL HISTORY: Teresa Peck 82 y.o. female returns to the clinic today for follow-up visit accompanied by 2 of her nieces.Discussed the use of AI scribe software for clinical note transcription with the patient, who gave verbal consent to proceed.  History of Present Illness   The patient, Teresa Peck, aged 82, presented with concerns about her blood work results, specifically her hemoglobin and hematocrit levels. She reported that she had stopped taking all supplements as previously advised. She also reported a new symptom of nervousness, describing it as a shaking sensation from head to toe, which she attributed to the cessation of her supplements. She expressed a desire to resume taking B12, D3, multivitamins, and magnesium. She also mentioned hair loss, which she believed could be remedied by taking Betaine.  The patient also reported experiencing congestion, for which she sought advice on over-the-counter remedies. She was scheduled for a lab test for JAK2 mutation, the results of which would determine the need for a follow-up visit.       MEDICAL HISTORY: Past Medical History:  Diagnosis Date   Anemia    Arthritis    CAD (coronary artery disease)    CAD (coronary artery disease)    Cancer (HCC)    skin cancer   CHF (congestive heart failure) (HCC)    Coronary artery disease    MI 2004-stent   Diabetes mellitus    Diabetic neuropathy (HCC)    GERD (gastroesophageal reflux disease)    tums   Gout    Hiatal hernia    Hyperlipidemia    Hypertension    PAD (peripheral artery disease) (HCC)    Tremor     ALLERGIES:  is allergic to mirtazapine, sulfamethoxazole-trimethoprim, and  elemental sulfur.  MEDICATIONS:  Current Outpatient Medications  Medication Sig Dispense Refill   acetaminophen (TYLENOL) 325 MG tablet Take 2 tablets (650 mg total) by mouth every 6 (six) hours as needed for mild pain or headache.     allopurinol (ZYLOPRIM) 300 MG tablet Take 300 mg by mouth daily.     apixaban (ELIQUIS) 2.5 MG TABS tablet Take 1 tablet (2.5 mg total) by mouth 2 (two) times daily. 180 tablet 3   Biotin 2500 MCG CAPS Take 2,500 mcg by mouth.     Boswellia-Glucosamine-Vit D (OSTEO BI-FLEX ONE PER DAY) TABS as directed Orally     Calcium Carb-Cholecalciferol (CALCIUM + D3) 600-800 MG-UNIT TABS Take 1 tablet by mouth daily.     Cyanocobalamin (VITAMIN B12) 1000 MCG TBCR Take 1 tablet by mouth daily. (Patient not taking: Reported on 12/25/2022)     dapagliflozin propanediol (FARXIGA) 10 MG TABS tablet Take 10 mg by mouth daily.     ezetimibe (ZETIA) 10 MG tablet Take 1 tablet by mouth daily.     furosemide (LASIX) 40 MG tablet Take 20 mg by mouth 2 (two) times a week.     HYDROcodone-acetaminophen (NORCO/VICODIN) 5-325 MG tablet Take 1 tablet by mouth every 8 (eight) hours as needed for moderate pain. (Patient not taking: Reported on 03/28/2022) 20 tablet 0   insulin glargine (LANTUS SOLOSTAR) 100 UNIT/ML Solostar Pen Inject 9 Units into the skin daily.     Magnesium Oxide 400 (240 Mg) MG TABS Take 1  tablet (400 mg total) by mouth daily. 30 tablet    metoprolol succinate (TOPROL-XL) 25 MG 24 hr tablet Take 25 mg by mouth daily.     Multiple Vitamins-Minerals (CENTRUM SILVER 50+WOMEN) TABS Take 1 tablet by mouth daily. (Patient not taking: Reported on 12/25/2022)     nitroGLYCERIN (NITROSTAT) 0.4 MG SL tablet Place 1 tablet (0.4 mg total) under the tongue every 5 (five) minutes as needed for chest pain. 25 tablet 3   rosuvastatin (CRESTOR) 10 MG tablet Take 10 mg by mouth daily.     senna-docusate (SENOKOT-S) 8.6-50 MG tablet Take 1 tablet by mouth 2 (two) times daily.     TRUE METRIX  BLOOD GLUCOSE TEST test strip 1 each by Other route as needed.     UNABLE TO FIND Med Name: pt reports she is using a different insulin pen but does not remember the name of the insulin.     UNABLE TO FIND Med Name: reports taking otc stool softener as needed     No current facility-administered medications for this visit.    SURGICAL HISTORY:  Past Surgical History:  Procedure Laterality Date   BILATERAL CORNEA IMPLANTS  2010   CARDIAC CATHETERIZATION     stent post MI    CHOLECYSTECTOMY     CORONARY ANGIOPLASTY     2004  stent   EYE SURGERY     KNEE SURGERY  1974   right   LUMBAR LAMINECTOMY/DECOMPRESSION MICRODISCECTOMY N/A 04/13/2015   Procedure: L4-5 Decompression;  Surgeon: Eldred Manges, MD;  Location: [facility] OR;  Service: Orthopedics;  Laterality: N/A;  Need RNFA    REVIEW OF SYSTEMS:  Constitutional: positive for fatigue Eyes: negative Ears, nose, mouth, throat, and face: negative Respiratory: negative Cardiovascular: negative Gastrointestinal: negative Genitourinary:negative Integument/breast: negative Hematologic/lymphatic: negative Musculoskeletal:negative Neurological: negative Behavioral/Psych: negative Endocrine: negative Allergic/Immunologic: negative   PHYSICAL EXAMINATION: General appearance: alert, cooperative, fatigued, and no distress Head: Normocephalic, without obvious abnormality, atraumatic Neck: no adenopathy, no JVD, supple, symmetrical, trachea midline, and thyroid not enlarged, symmetric, no tenderness/mass/nodules Lymph nodes: Cervical, supraclavicular, and axillary nodes normal. Resp: clear to auscultation bilaterally Back: symmetric, no curvature. ROM normal. No CVA tenderness. Cardio: regular rate and rhythm, S1, S2 normal, no murmur, click, rub or gallop GI: soft, non-tender; bowel sounds normal; no masses,  no organomegaly Extremities: extremities normal, atraumatic, no cyanosis or edema Neurologic: Alert and oriented X 3, normal strength  and tone. Normal symmetric reflexes. Normal coordination and gait  ECOG PERFORMANCE STATUS: 1 - Symptomatic but completely ambulatory  Blood pressure 120/68, pulse 69, temperature 98 F (36.7 C), temperature source Temporal, resp. rate 18, height 5\' 4"  (1.626 m), weight 168 lb 3.2 oz (76.3 kg), SpO2 100%.  LABORATORY DATA: Lab Results  Component Value Date   WBC 6.0 01/31/2023   HGB 16.8 (H) 01/31/2023   HCT 49.3 (H) 01/31/2023   MCV 97.8 01/31/2023   PLT 153 01/31/2023      Chemistry      Component Value Date/Time   NA 135 12/25/2022 1127   NA 144 02/26/2018 1631   K 4.3 12/25/2022 1127   CL 99 12/25/2022 1127   CO2 30 12/25/2022 1127   BUN 20 12/25/2022 1127   BUN 22 02/26/2018 1631   CREATININE 1.48 (H) 12/25/2022 1127   CREATININE 1.47 (H) 12/08/2015 1555      Component Value Date/Time   CALCIUM 10.0 12/25/2022 1127   ALKPHOS 62 12/25/2022 1127   AST 26 12/25/2022 1127   ALT  23 12/25/2022 1127   BILITOT 0.8 12/25/2022 1127       RADIOGRAPHIC STUDIES: No results found.  ASSESSMENT AND PLAN: This is a very pleasant 82 years old white female with persistent polycythemia likely reactive in nature but polycythemia vera could not be completely excluded at this point.    Polycythemia Vera (Rule Out) Elevated hemoglobin (16.8) and hematocrit (49.3) levels, trending down but not within desired range. Differential diagnosis includes polycythemia vera. Discussed JAK2 mutation test to rule out polycythemia vera. If negative, no follow-up required; if positive, follow-up visit needed. - Order JAK2 mutation test - Schedule follow-up visit if JAK2 mutation test is positive  Nervousness and Tremors Reports nervousness and tremors since discontinuing vitamins. Symptoms include shaking from head to toes. Discussed resuming specific supplements to alleviate symptoms. - Resume B12, D3, magnesium, and Biotine supplements - Advise against taking Centrum  multivitamin  Congestion Reports congestion. Advised to use over-the-counter medications for symptom management. - Recommend over-the-counter medications such as Claritin or Zyrtec.   The patient was advised to call immediately if she has any other concerning symptoms in the interval.  The patient voices understanding of current disease status and treatment options and is in agreement with the current care plan.  All questions were answered. The patient knows to call the clinic with any problems, questions or concerns. We can certainly see the patient much sooner if necessary.  The total time spent in the appointment was 30 minutes.  Disclaimer: This note was dictated with voice recognition software. Similar sounding words can inadvertently be transcribed and may not be corrected upon review.

## 2023-02-06 LAB — MISC LABCORP TEST (SEND OUT): Labcorp test code: 489514

## 2023-02-11 ENCOUNTER — Telehealth: Payer: Self-pay | Admitting: *Deleted

## 2023-02-11 NOTE — Telephone Encounter (Signed)
-----   Message from Cassandra L Heilingoetter sent at 02/11/2023  1:19 PM EST ----- Teresa Peck, can you get in touch with the patient about Dr. Asa Lente result note below? Thank you. ----- Message ----- From: Si Gaul, MD Sent: 02/06/2023   8:32 PM EST To: Cassandra L Heilingoetter, PA-C; [facility]  JAK2 mutation panel is negative.  She has reactive polycythemia and no need to continue follow-up with [place] at this point.  We can cancel her future appointment.  Thank you. ----- Message ----- From: Leory Plowman, Lab [facility] [place] Sent: 02/06/2023   3:36 PM EST To: Si Gaul, MD

## 2023-02-11 NOTE — Telephone Encounter (Signed)
Notified of message below

## 2023-03-01 ENCOUNTER — Other Ambulatory Visit: Payer: Self-pay | Admitting: Cardiovascular Disease

## 2023-03-04 DIAGNOSIS — N1832 Chronic kidney disease, stage 3b: Secondary | ICD-10-CM | POA: Diagnosis not present

## 2023-03-07 DIAGNOSIS — N1832 Chronic kidney disease, stage 3b: Secondary | ICD-10-CM | POA: Diagnosis not present

## 2023-03-21 DIAGNOSIS — I10 Essential (primary) hypertension: Secondary | ICD-10-CM | POA: Diagnosis not present

## 2023-03-21 DIAGNOSIS — E538 Deficiency of other specified B group vitamins: Secondary | ICD-10-CM | POA: Diagnosis not present

## 2023-03-21 DIAGNOSIS — E1165 Type 2 diabetes mellitus with hyperglycemia: Secondary | ICD-10-CM | POA: Diagnosis not present

## 2023-03-21 DIAGNOSIS — E7800 Pure hypercholesterolemia, unspecified: Secondary | ICD-10-CM | POA: Diagnosis not present

## 2023-04-03 ENCOUNTER — Ambulatory Visit: Payer: Medicare HMO | Admitting: Cardiovascular Disease

## 2023-04-17 DIAGNOSIS — I480 Paroxysmal atrial fibrillation: Secondary | ICD-10-CM | POA: Diagnosis not present

## 2023-04-17 DIAGNOSIS — Z0000 Encounter for general adult medical examination without abnormal findings: Secondary | ICD-10-CM | POA: Diagnosis not present

## 2023-04-17 DIAGNOSIS — N1831 Chronic kidney disease, stage 3a: Secondary | ICD-10-CM | POA: Diagnosis not present

## 2023-04-17 DIAGNOSIS — Z7901 Long term (current) use of anticoagulants: Secondary | ICD-10-CM | POA: Diagnosis not present

## 2023-04-17 DIAGNOSIS — E1165 Type 2 diabetes mellitus with hyperglycemia: Secondary | ICD-10-CM | POA: Diagnosis not present

## 2023-05-02 DIAGNOSIS — I5022 Chronic systolic (congestive) heart failure: Secondary | ICD-10-CM | POA: Diagnosis not present

## 2023-05-02 DIAGNOSIS — I4891 Unspecified atrial fibrillation: Secondary | ICD-10-CM | POA: Diagnosis not present

## 2023-05-02 DIAGNOSIS — I130 Hypertensive heart and chronic kidney disease with heart failure and stage 1 through stage 4 chronic kidney disease, or unspecified chronic kidney disease: Secondary | ICD-10-CM | POA: Diagnosis not present

## 2023-05-02 DIAGNOSIS — R809 Proteinuria, unspecified: Secondary | ICD-10-CM | POA: Diagnosis not present

## 2023-05-02 DIAGNOSIS — E1122 Type 2 diabetes mellitus with diabetic chronic kidney disease: Secondary | ICD-10-CM | POA: Diagnosis not present

## 2023-05-02 DIAGNOSIS — N1832 Chronic kidney disease, stage 3b: Secondary | ICD-10-CM | POA: Diagnosis not present

## 2023-05-20 ENCOUNTER — Encounter: Payer: Self-pay | Admitting: Cardiovascular Disease

## 2023-05-20 NOTE — Progress Notes (Unsigned)
 Cardiology Office Note   Date:  05/21/2023   ID:  Teresa Peck, DOB [birth year], MRN [id number]  PCP:  Merri Brunette, MD  Cardiologist:   Kristeen Miss, MD   Chief Complaint  Patient presents with   Coronary Artery Disease        Atrial Fibrillation        Problem list: 1. Coronary artery disease: She has an occluded LAD. She status post stenting of her first diagonal 2. Hyperlipidemia 3. Chronic systolic congestive heart failure 4. Essential hypertension 5. 5. Diabetes mellitus - peripheral neuropathy    Teresa Peck is a 83 y.o. female who presents for follow-up of her coronary artery disease. She's been seen in the past by Dr. Mariah Peck. She was seen with her Daughter, Teresa Peck.  She had a cath in 2004 and had stenting of her 1st diag. Dr. Ricki Miller has stopped some of her meds ( Coreg) also stopped allopurinol and Digoxin.   She denies any cp.  Breathing is ok.   Eats lots of Progresso soup.  She does not exercise.   Knows that she needs to walk more.  Has claudication with ambulation .   Aug. 30, 2016:   Doing well.  Complains of leg pain - was told that she had diabetic neuropathy Lots of leg swelling ,  Still eating lots of soup and canned foods.   Dec. 2 , 2016:  Breathing is good.  Leg Swelling is better  She thinks the Actos is causing fluid retention and weight gain .   May 17, 2015:  Has had her back surgery  ,.   Legs and back feel much better.  She was hypotensive prior to her back surgery and we held her coreg and Quinipril   Sept. 19, 2017:  Teresa Peck is seen back for a follow up visit. Has cut out her salt. Has lots 8 lbs.  Breathing is ok   Cannot tell that her HR is irregular.   Sept. 11, 2018:  Seen today for follow  Breathing is going well.   June 04, 2017:   Teresa Peck is seen today for follow-up of her coronary artery disease, chronic systolic congestive heart failure, atrial fibrillation.  She also has a history of  hyperlipidemia.  Has bilateral leg pain when she walks   February 26, 2018  Seen for follow  Up  Still has frequent episodes of leg pain, she admits that she does not walk enough. Her breathing seems to be okay.  She is not having any episodes of chest pain.  Nov. 6, 2020   Doing well. Still in Afib  Had a serious fall in July,  ? Syncope  Was in the hospital off and on through most of the month of July  Doing well , no syncope   August 10, 2019: Teresa Peck is seen today for follow-up of her congestive heart failure, atrial fibrillation, coronary artery disease. Seen with daughter, Teresa Peck .  Has developed a tremor.   Cannot find a solution to her tremor .  No CP or dyspnea   Feb. 18, 2022: Teresa Peck is seen today for follow up of her CHF, Afib, CAD . No cp, no dyspnea.    Complains of bilateral calf pain .  She was concerned about PAD. Screening ABIs in 2019 were normal .  Bp is borderline low today  Drinks water but does not eat much protein   January 10, 2021: Teresa Peck is seen today for follow-up of her  atrial fibrillation, congestive heart failure, coronary artery disease. Not feeling very well - no cardiac complaints  HR and BP are well controlled.  She brought her container of "total Beets" and asked if it was safe. It seems to be safe.    Feb. 7, 2024 Seen with daughter, Teresa Peck   No CP or dyspnea   May 21, 2023 Teresa Peck is seen for follow up of her atrial fib, CHF, CAD  Doing well  Lives alone     Past Medical History:  Diagnosis Date   Anemia    Arthritis    CAD (coronary artery disease)    CAD (coronary artery disease)    Cancer (HCC)    skin cancer   CHF (congestive heart failure) (HCC)    Coronary artery disease    MI 2004-stent   Diabetes mellitus    Diabetic neuropathy (HCC)    GERD (gastroesophageal reflux disease)    tums   Gout    Hiatal hernia    Hyperlipidemia    Hypertension    PAD (peripheral artery disease) (HCC)    Tremor      Past Surgical History:  Procedure Laterality Date   BILATERAL CORNEA IMPLANTS  2010   CARDIAC CATHETERIZATION     stent post MI    CHOLECYSTECTOMY     CORONARY ANGIOPLASTY     2004  stent   EYE SURGERY     KNEE SURGERY  1974   right   LUMBAR LAMINECTOMY/DECOMPRESSION MICRODISCECTOMY N/A 04/13/2015   Procedure: L4-5 Decompression;  Surgeon: Eldred Manges, MD;  Location: [facility] OR;  Service: Orthopedics;  Laterality: N/A;  Need RNFA     Current Outpatient Medications  Medication Sig Dispense Refill   acetaminophen (TYLENOL) 325 MG tablet Take 2 tablets (650 mg total) by mouth every 6 (six) hours as needed for mild pain or headache.     allopurinol (ZYLOPRIM) 300 MG tablet Take 300 mg by mouth daily.     apixaban (ELIQUIS) 2.5 MG TABS tablet Take 1 tablet (2.5 mg total) by mouth 2 (two) times daily. 180 tablet 3   Biotin 2500 MCG CAPS Take 2,500 mcg by mouth.     Boswellia-Glucosamine-Vit D (OSTEO BI-FLEX ONE PER DAY) TABS as directed Orally     Cyanocobalamin (VITAMIN B12) 1000 MCG TBCR Take 1 tablet by mouth daily.     dapagliflozin propanediol (FARXIGA) 10 MG TABS tablet Take 10 mg by mouth daily.     ezetimibe (ZETIA) 10 MG tablet Take 1 tablet by mouth daily.     furosemide (LASIX) 40 MG tablet Take 20 mg by mouth 2 (two) times a week.     insulin glargine (LANTUS SOLOSTAR) 100 UNIT/ML Solostar Pen Inject 9 Units into the skin daily.     Magnesium Oxide 400 (240 Mg) MG TABS Take 1 tablet (400 mg total) by mouth daily. 30 tablet    metoprolol succinate (TOPROL-XL) 25 MG 24 hr tablet Take 25 mg by mouth daily.     Multiple Vitamins-Minerals (CENTRUM SILVER 50+WOMEN) TABS Take 1 tablet by mouth daily.     nitroGLYCERIN (NITROSTAT) 0.4 MG SL tablet DISSOLVE ONE TABLET UNDER THE TONGUE EVERY 5 MINUTES AS NEEDED FOR CHEST PAIN.  DO NOT EXCEED A TOTAL OF 3 DOSES IN 15 MINUTES 25 tablet 0   rosuvastatin (CRESTOR) 10 MG tablet Take 10 mg by mouth daily.     senna-docusate (SENOKOT-S) 8.6-50  MG tablet Take 1 tablet by mouth 2 (two) times daily.  TRUE METRIX BLOOD GLUCOSE TEST test strip 1 each by Other route as needed.     UNABLE TO FIND Med Name: pt reports she is using a different insulin pen but does not remember the name of the insulin.     UNABLE TO FIND Med Name: reports taking otc stool softener as needed     Calcium Carb-Cholecalciferol (CALCIUM + D3) 600-800 MG-UNIT TABS Take 1 tablet by mouth daily. (Patient not taking: Reported on 05/21/2023)     HYDROcodone-acetaminophen (NORCO/VICODIN) 5-325 MG tablet Take 1 tablet by mouth every 8 (eight) hours as needed for moderate pain. (Patient not taking: Reported on 05/21/2023) 20 tablet 0   senna (SENOKOT) 8.6 MG tablet Take by mouth as needed for constipation.     No current facility-administered medications for this visit.    Allergies:   Mirtazapine, Sulfamethoxazole-trimethoprim, Elemental sulfur, and Sulfur    Social History:  The patient  reports that she quit smoking about 45 years ago. Her smoking use included cigarettes. She started smoking about 50 years ago. She has a 2.5 pack-year smoking history. She has never used smokeless tobacco. She reports that she does not drink alcohol and does not use drugs.   Family History:  The patient's family history includes CAD in her mother; Cancer in her mother; Heart attack in her brother, father, and sister; Heart disease in her mother; Stroke in her mother.    ROS: Noted in current history, otherwise review of systems is negative.    Physical Exam: Blood pressure 122/84, pulse (!) 110, height 5\' 4"  (1.626 m), weight 167 lb 9.6 oz (76 kg), SpO2 97%.       GEN:  elderly , frail female  in no acute distress HEENT: Normal NECK: No JVD; No carotid bruits LYMPHATICS: No lymphadenopathy CARDIAC: irreg. Irreg.  RESPIRATORY:  Clear to auscultation without rales, wheezing or rhonchi  ABDOMEN: Soft, non-tender, non-distended MUSCULOSKELETAL:  No edema; No deformity  SKIN:  Warm and dry NEUROLOGIC:  Alert and oriented x 3    EKG:        EKG Interpretation Date/Time:  Tuesday May 21 2023 15:15:18 EDT Ventricular Rate:  77 PR Interval:    QRS Duration:  84 QT Interval:  380 QTC Calculation: 430 R Axis:   -71  Text Interpretation: Atrial fibrillation Left anterior fascicular block Septal infarct (cited on or before 17-May-2002) T wave abnormality, consider lateral ischemia When compared with ECG of 02-Sep-2018 12:05, Serial changes of Septal infarct Present Confirmed by Kristeen Miss ([id number]) on 05/21/2023 3:24:49 PM     Recent Labs: 12/25/2022: ALT 23; BUN 20; Creatinine 1.48; Potassium 4.3; Sodium 135 01/31/2023: Hemoglobin 16.8; Platelet Count 153    Lipid Panel    Component Value Date/Time   CHOL 115 02/26/2018 1631   TRIG 112 02/26/2018 1631   HDL 52 02/26/2018 1631   CHOLHDL 2.2 02/26/2018 1631   LDLCALC 41 02/26/2018 1631      Wt Readings from Last 3 Encounters:  05/21/23 167 lb 9.6 oz (76 kg)  01/31/23 168 lb 3.2 oz (76.3 kg)  12/25/22 166 lb 12.8 oz (75.7 kg)    Other studies Reviewed: Additional studies/ records that were reviewed today include: . Review of the above records demonstrates:   ASSESSMENT AND PLAN:  1. Coronary artery disease:    no angina .     2. Atrial Fibrillation :   CHADS2VASC is  84  ( female, age 80, DM,  HTN)  Continue toprol XL , eliquis  3. Chronic systolic congestive heart failure:  advised low salt diet    4. Essential hypertension -.   BP is well controlled.    5. Diabetes mellitus -   6. Leg pain :      has PAD .  Follows with Dr. Allyson Sabal   Current medicines are reviewed at length with the patient today.  The patient does not have concerns regarding medicines.    Disposition:     Kristeen Miss, MD  05/21/2023 3:26 PM    [facility] [address] [place], [place], [place]  [address] Phone: [phone]; Fax: [phone]

## 2023-05-21 ENCOUNTER — Ambulatory Visit: Payer: Medicare HMO | Attending: Cardiovascular Disease | Admitting: Cardiovascular Disease

## 2023-05-21 ENCOUNTER — Encounter: Payer: Self-pay | Admitting: Cardiovascular Disease

## 2023-05-21 VITALS — BP 122/84 | HR 110 | Ht 64.0 in | Wt 167.6 lb

## 2023-05-21 DIAGNOSIS — I5043 Acute on chronic combined systolic (congestive) and diastolic (congestive) heart failure: Secondary | ICD-10-CM

## 2023-05-21 DIAGNOSIS — I482 Chronic atrial fibrillation, unspecified: Secondary | ICD-10-CM | POA: Diagnosis not present

## 2023-05-21 DIAGNOSIS — I2510 Atherosclerotic heart disease of native coronary artery without angina pectoris: Secondary | ICD-10-CM | POA: Diagnosis not present

## 2023-05-21 NOTE — Patient Instructions (Signed)
 Follow-Up: At [facility], you and your health needs are our priority.  As part of our continuing mission to provide you with exceptional heart care, our providers are all part of one team.  This team includes your primary Cardiologist (physician) and Advanced Practice Providers or APPs (Physician Assistants and Nurse Practitioners) who all work together to provide you with the care you need, when you need it.  Your next appointment:   1 year(s)  Provider:   Kristeen Miss, MD     1st Floor: - Lobby - Registration  - Pharmacy  - Lab - Cafe  2nd Floor: - PV Lab - Diagnostic Testing (echo, CT, nuclear med)  3rd Floor: - Vacant  4th Floor: - TCTS (cardiothoracic surgery) - AFib Clinic - Structural Heart Clinic - Vascular Surgery  - Vascular Ultrasound  5th Floor: - HeartCare Cardiology (general and EP) - Clinical Pharmacy for coumadin, hypertension, lipid, weight-loss medications, and med management appointments    Valet parking services will be available as well.

## 2023-06-12 DIAGNOSIS — J300 Vasomotor rhinitis: Secondary | ICD-10-CM | POA: Diagnosis not present

## 2023-06-12 DIAGNOSIS — H9121 Sudden idiopathic hearing loss, right ear: Secondary | ICD-10-CM | POA: Diagnosis not present

## 2023-07-16 DIAGNOSIS — H16143 Punctate keratitis, bilateral: Secondary | ICD-10-CM | POA: Diagnosis not present

## 2023-07-30 DIAGNOSIS — E1165 Type 2 diabetes mellitus with hyperglycemia: Secondary | ICD-10-CM | POA: Diagnosis not present

## 2023-07-30 DIAGNOSIS — I480 Paroxysmal atrial fibrillation: Secondary | ICD-10-CM | POA: Diagnosis not present

## 2023-07-30 DIAGNOSIS — I10 Essential (primary) hypertension: Secondary | ICD-10-CM | POA: Diagnosis not present

## 2023-07-30 DIAGNOSIS — M2550 Pain in unspecified joint: Secondary | ICD-10-CM | POA: Diagnosis not present

## 2023-07-30 DIAGNOSIS — E7800 Pure hypercholesterolemia, unspecified: Secondary | ICD-10-CM | POA: Diagnosis not present

## 2023-08-21 DIAGNOSIS — Z961 Presence of intraocular lens: Secondary | ICD-10-CM | POA: Diagnosis not present

## 2023-08-21 DIAGNOSIS — H16143 Punctate keratitis, bilateral: Secondary | ICD-10-CM | POA: Diagnosis not present

## 2023-08-21 DIAGNOSIS — B0052 Herpesviral keratitis: Secondary | ICD-10-CM | POA: Diagnosis not present

## 2023-08-26 DIAGNOSIS — Z961 Presence of intraocular lens: Secondary | ICD-10-CM | POA: Diagnosis not present

## 2023-08-26 DIAGNOSIS — H16143 Punctate keratitis, bilateral: Secondary | ICD-10-CM | POA: Diagnosis not present

## 2023-08-26 DIAGNOSIS — B0052 Herpesviral keratitis: Secondary | ICD-10-CM | POA: Diagnosis not present

## 2023-09-16 DIAGNOSIS — Z961 Presence of intraocular lens: Secondary | ICD-10-CM | POA: Diagnosis not present

## 2023-09-16 DIAGNOSIS — B0052 Herpesviral keratitis: Secondary | ICD-10-CM | POA: Diagnosis not present

## 2023-09-16 DIAGNOSIS — H16143 Punctate keratitis, bilateral: Secondary | ICD-10-CM | POA: Diagnosis not present

## 2023-10-14 DIAGNOSIS — E1165 Type 2 diabetes mellitus with hyperglycemia: Secondary | ICD-10-CM | POA: Diagnosis not present

## 2023-10-14 DIAGNOSIS — H16143 Punctate keratitis, bilateral: Secondary | ICD-10-CM | POA: Diagnosis not present

## 2023-10-14 DIAGNOSIS — H53032 Strabismic amblyopia, left eye: Secondary | ICD-10-CM | POA: Diagnosis not present

## 2023-10-14 DIAGNOSIS — B0052 Herpesviral keratitis: Secondary | ICD-10-CM | POA: Diagnosis not present

## 2023-10-14 DIAGNOSIS — Z961 Presence of intraocular lens: Secondary | ICD-10-CM | POA: Diagnosis not present

## 2023-11-04 DIAGNOSIS — N1832 Chronic kidney disease, stage 3b: Secondary | ICD-10-CM | POA: Diagnosis not present

## 2023-11-06 DIAGNOSIS — N1832 Chronic kidney disease, stage 3b: Secondary | ICD-10-CM | POA: Diagnosis not present

## 2023-11-14 DIAGNOSIS — I5022 Chronic systolic (congestive) heart failure: Secondary | ICD-10-CM | POA: Diagnosis not present

## 2023-11-14 DIAGNOSIS — I130 Hypertensive heart and chronic kidney disease with heart failure and stage 1 through stage 4 chronic kidney disease, or unspecified chronic kidney disease: Secondary | ICD-10-CM | POA: Diagnosis not present

## 2023-11-14 DIAGNOSIS — N1832 Chronic kidney disease, stage 3b: Secondary | ICD-10-CM | POA: Diagnosis not present

## 2023-11-14 DIAGNOSIS — E1122 Type 2 diabetes mellitus with diabetic chronic kidney disease: Secondary | ICD-10-CM | POA: Diagnosis not present

## 2023-11-14 DIAGNOSIS — R809 Proteinuria, unspecified: Secondary | ICD-10-CM | POA: Diagnosis not present

## 2023-11-26 DIAGNOSIS — Z794 Long term (current) use of insulin: Secondary | ICD-10-CM | POA: Diagnosis not present

## 2023-11-26 DIAGNOSIS — I10 Essential (primary) hypertension: Secondary | ICD-10-CM | POA: Diagnosis not present

## 2023-11-26 DIAGNOSIS — I480 Paroxysmal atrial fibrillation: Secondary | ICD-10-CM | POA: Diagnosis not present

## 2023-11-26 DIAGNOSIS — E1121 Type 2 diabetes mellitus with diabetic nephropathy: Secondary | ICD-10-CM | POA: Diagnosis not present

## 2024-02-01 ENCOUNTER — Ambulatory Visit (HOSPITAL_COMMUNITY): Admission: EM | Admit: 2024-02-01 | Discharge: 2024-02-01 | Disposition: A | Discharge status: Home / Self Care

## 2024-02-01 ENCOUNTER — Ambulatory Visit (HOSPITAL_COMMUNITY)

## 2024-02-01 ENCOUNTER — Encounter (HOSPITAL_COMMUNITY): Payer: Self-pay

## 2024-02-01 DIAGNOSIS — M25562 Pain in left knee: Secondary | ICD-10-CM

## 2024-02-01 DIAGNOSIS — M25561 Pain in right knee: Secondary | ICD-10-CM

## 2024-02-01 DIAGNOSIS — M1712 Unilateral primary osteoarthritis, left knee: Secondary | ICD-10-CM | POA: Diagnosis not present

## 2024-02-01 DIAGNOSIS — G8929 Other chronic pain: Secondary | ICD-10-CM | POA: Diagnosis not present

## 2024-02-01 MED ORDER — PREDNISONE 20 MG PO TABS: 40.0000 mg | ORAL_TABLET | Freq: Every day | ORAL | 0 refills | Status: AC

## 2024-02-01 NOTE — Discharge Instructions (Addendum)
 You were seen today for left knee pain and swelling that has been worsening over time. Your X-ray shows arthritis changes in the knee, which can cause pain, stiffness, and swelling, especially with activity. There were no signs of a fracture or other acute injury. Because of your kidney condition and blood thinner use, certain pain medicines are not safe for you, so treatment is focused on reducing inflammation and managing symptoms safely.  To help with pain and swelling, continue using the knee sleeve for support, elevate the leg when resting, and apply ice to the knee for 20 minutes up to three times daily, making sure not to place ice directly on the skin. You may continue using Tylenol  and topical treatments such as Voltaren gel or BioFreeze as directed. Take the prescribed steroid medication as instructed to help reduce inflammation. Avoid anti-inflammatory medications like ibuprofen or naproxen unless specifically cleared by your doctor.  Follow up with an orthopedic specialist for further evaluation and long-term management options, such as injections or additional imaging. You may see any orthopedic provider of your choice. Go to the emergency room right away if you develop severe or rapidly worsening pain, redness, warmth, fever, inability to bear weight, significant swelling, numbness, or signs of bleeding such as extensive bruising or joint swelling, or if any new or concerning symptoms occur.
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
